# Patient Record
Sex: Male | Born: 1935 | Race: White | Hispanic: No | Marital: Married | Smoking: Former smoker
Health system: Southern US, Community
[De-identification: ages and names within clinical notes are randomized; demographics above are authoritative.]

## PROBLEM LIST (undated history)

## (undated) DIAGNOSIS — E785 Hyperlipidemia, unspecified: Secondary | ICD-10-CM

## (undated) DIAGNOSIS — N529 Male erectile dysfunction, unspecified: Secondary | ICD-10-CM

## (undated) DIAGNOSIS — N433 Hydrocele, unspecified: Secondary | ICD-10-CM

## (undated) DIAGNOSIS — N3944 Nocturnal enuresis: Secondary | ICD-10-CM

## (undated) DIAGNOSIS — N400 Enlarged prostate without lower urinary tract symptoms: Secondary | ICD-10-CM

## (undated) DIAGNOSIS — I2510 Atherosclerotic heart disease of native coronary artery without angina pectoris: Secondary | ICD-10-CM

## (undated) DIAGNOSIS — I219 Acute myocardial infarction, unspecified: Secondary | ICD-10-CM

## (undated) DIAGNOSIS — K635 Polyp of colon: Secondary | ICD-10-CM

## (undated) DIAGNOSIS — I10 Essential (primary) hypertension: Secondary | ICD-10-CM

## (undated) HISTORY — DX: Benign prostatic hyperplasia without lower urinary tract symptoms: N40.0

## (undated) HISTORY — PX: RECTAL VILLOUS ADENOMA EXCISION: SHX2313

## (undated) HISTORY — DX: Hyperlipidemia, unspecified: E78.5

## (undated) HISTORY — PX: COLONOSCOPY W/ POLYPECTOMY: SHX1380

## (undated) HISTORY — PX: CORONARY ANGIOPLASTY: SHX604

## (undated) HISTORY — DX: Polyp of colon: K63.5

## (undated) HISTORY — DX: Nocturnal enuresis: N39.44

## (undated) HISTORY — PX: VASECTOMY: SHX75

## (undated) HISTORY — PX: KNEE ARTHROSCOPY: SUR90

## (undated) HISTORY — DX: Male erectile dysfunction, unspecified: N52.9

## (undated) HISTORY — DX: Atherosclerotic heart disease of native coronary artery without angina pectoris: I25.10

## (undated) HISTORY — DX: Hydrocele, unspecified: N43.3

---

## 1990-04-24 DIAGNOSIS — I219 Acute myocardial infarction, unspecified: Secondary | ICD-10-CM

## 1990-04-24 HISTORY — DX: Acute myocardial infarction, unspecified: I21.9

## 2005-06-28 ENCOUNTER — Encounter: Admission: RE | Admit: 2005-06-28 | Discharge: 2005-06-28 | Payer: Self-pay | Admitting: Internal Medicine

## 2010-08-15 ENCOUNTER — Other Ambulatory Visit: Payer: Self-pay | Admitting: Internal Medicine

## 2010-08-15 DIAGNOSIS — R42 Dizziness and giddiness: Secondary | ICD-10-CM

## 2010-08-16 ENCOUNTER — Ambulatory Visit
Admission: RE | Admit: 2010-08-16 | Discharge: 2010-08-16 | Disposition: A | Discharge status: Home / Self Care | Payer: Medicare Other | Source: Ambulatory Visit | Attending: Internal Medicine | Admitting: Internal Medicine

## 2010-08-16 DIAGNOSIS — R42 Dizziness and giddiness: Secondary | ICD-10-CM

## 2010-08-16 MED ORDER — IOHEXOL 300 MG/ML  SOLN
75.0000 mL | Freq: Once | INTRAMUSCULAR | Status: AC | PRN
  Administered 2010-08-16: 75 mL via INTRAVENOUS

## 2012-01-12 ENCOUNTER — Encounter (HOSPITAL_COMMUNITY): Payer: Self-pay | Admitting: Emergency Medicine

## 2012-01-12 DIAGNOSIS — Z79899 Other long term (current) drug therapy: Secondary | ICD-10-CM | POA: Insufficient documentation

## 2012-01-12 DIAGNOSIS — I10 Essential (primary) hypertension: Secondary | ICD-10-CM | POA: Insufficient documentation

## 2012-01-12 DIAGNOSIS — Z7982 Long term (current) use of aspirin: Secondary | ICD-10-CM | POA: Insufficient documentation

## 2012-01-12 DIAGNOSIS — R1013 Epigastric pain: Secondary | ICD-10-CM | POA: Insufficient documentation

## 2012-01-12 DIAGNOSIS — N400 Enlarged prostate without lower urinary tract symptoms: Secondary | ICD-10-CM | POA: Insufficient documentation

## 2012-01-12 DIAGNOSIS — N433 Hydrocele, unspecified: Secondary | ICD-10-CM | POA: Insufficient documentation

## 2012-01-12 DIAGNOSIS — E669 Obesity, unspecified: Secondary | ICD-10-CM | POA: Insufficient documentation

## 2012-01-12 DIAGNOSIS — I252 Old myocardial infarction: Secondary | ICD-10-CM | POA: Insufficient documentation

## 2012-01-12 LAB — POCT I-STAT TROPONIN I: Troponin i, poc: 0 ng/mL (ref 0.00–0.08)

## 2012-01-12 NOTE — ED Notes (Signed)
Reports burning pain in mid abd--went to PCP the beginning of week--was given,  dexilant with no relief; reports has been getting worse; decrease appetite, denies n/v; reports having flatus; LBM 09/18--states it was darker than normal--unsure if black or dark brown

## 2012-01-13 ENCOUNTER — Emergency Department (HOSPITAL_COMMUNITY): Payer: Medicare Other

## 2012-01-13 ENCOUNTER — Emergency Department (HOSPITAL_COMMUNITY)
Admission: EM | Admit: 2012-01-13 | Discharge: 2012-01-13 | Disposition: A | Discharge status: Home / Self Care | Payer: Medicare Other | Attending: Emergency Medicine | Admitting: Emergency Medicine

## 2012-01-13 DIAGNOSIS — E669 Obesity, unspecified: Secondary | ICD-10-CM

## 2012-01-13 DIAGNOSIS — R1013 Epigastric pain: Secondary | ICD-10-CM

## 2012-01-13 HISTORY — DX: Essential (primary) hypertension: I10

## 2012-01-13 HISTORY — DX: Hyperlipidemia, unspecified: E78.5

## 2012-01-13 HISTORY — DX: Acute myocardial infarction, unspecified: I21.9

## 2012-01-13 LAB — URINALYSIS, ROUTINE W REFLEX MICROSCOPIC
Glucose, UA: NEGATIVE mg/dL
Specific Gravity, Urine: >1.03 — ABNORMAL HIGH (ref 1.005–1.030)
Urobilinogen, UA: 1 mg/dL (ref 0.0–1.0)
pH: 5 (ref 5.0–8.0)

## 2012-01-13 LAB — COMPREHENSIVE METABOLIC PANEL
BUN: 8 mg/dL (ref 6–23)
CO2: 26 mEq/L (ref 19–32)
Calcium: 11 mg/dL — ABNORMAL HIGH (ref 8.4–10.5)
Chloride: 99 mEq/L (ref 96–112)
GFR calc Af Amer: >90 mL/min (ref >90)
Glucose, Bld: 130 mg/dL — ABNORMAL HIGH (ref 70–99)
Potassium: 3.8 mEq/L (ref 3.5–5.1)
Total Protein: 8 g/dL (ref 6.0–8.3)

## 2012-01-13 LAB — CBC WITH DIFFERENTIAL/PLATELET
Basophils Relative: 0 % (ref 0–1)
Eosinophils Absolute: 0.1 10*3/uL (ref 0.0–0.7)
Eosinophils Relative: 2 % (ref 0–5)
HCT: 44.7 % (ref 39.0–52.0)
Hemoglobin: 16.1 g/dL (ref 13.0–17.0)
Lymphocytes Relative: 35 % (ref 12–46)
Lymphs Abs: 2.5 10*3/uL (ref 0.7–4.0)
MCHC: 36 g/dL (ref 30.0–36.0)
Neutro Abs: 3.9 10*3/uL (ref 1.7–7.7)
RBC: 5.11 MIL/uL (ref 4.22–5.81)
RDW: 12.9 % (ref 11.5–15.5)
WBC: 7.1 10*3/uL (ref 4.0–10.5)

## 2012-01-13 LAB — URINE MICROSCOPIC-ADD ON

## 2012-01-13 LAB — LIPASE, BLOOD: Lipase: 32 U/L (ref 11–59)

## 2012-01-13 MED ORDER — ONDANSETRON HCL 4 MG/2ML IJ SOLN
4.0000 mg | Freq: Once | INTRAMUSCULAR | Status: AC
  Administered 2012-01-13: 4 mg via INTRAVENOUS
  Filled 2012-01-13: qty 2

## 2012-01-13 MED ORDER — SODIUM CHLORIDE 0.9 % IV SOLN
Freq: Once | INTRAVENOUS | Status: AC
  Administered 2012-01-13: 03:00:00 via INTRAVENOUS

## 2012-01-13 MED ORDER — IOHEXOL 300 MG/ML  SOLN
20.0000 mL | INTRAMUSCULAR | Status: AC
  Administered 2012-01-13: 20 mL via ORAL

## 2012-01-13 MED ORDER — HYDROMORPHONE HCL PF 1 MG/ML IJ SOLN
1.0000 mg | Freq: Once | INTRAMUSCULAR | Status: AC
  Administered 2012-01-13: 1 mg via INTRAVENOUS
  Filled 2012-01-13: qty 1

## 2012-01-13 MED ORDER — SUCRALFATE 1 GM/10ML PO SUSP: 1.0000 g | Freq: Four times a day (QID) | ORAL | Status: DC

## 2012-01-13 MED ORDER — IOHEXOL 300 MG/ML  SOLN
100.0000 mL | Freq: Once | INTRAMUSCULAR | Status: AC | PRN
  Administered 2012-01-13: 100 mL via INTRAVENOUS

## 2012-01-13 MED ORDER — FAMOTIDINE IN NACL 20-0.9 MG/50ML-% IV SOLN
20.0000 mg | Freq: Once | INTRAVENOUS | Status: AC
  Administered 2012-01-13: 20 mg via INTRAVENOUS
  Filled 2012-01-13: qty 50

## 2012-01-13 NOTE — ED Notes (Signed)
Patient transported to CT 

## 2012-01-13 NOTE — ED Provider Notes (Signed)
History     CSN: [id number]  Arrival date & time 01/12/12  2317   First MD Initiated Contact with Patient 01/13/12 0209      Chief Complaint  Patient presents with  . Abdominal Pain    (Consider location/radiation/quality/duration/timing/severity/associated sxs/prior treatment) HPI Please note that this is a late entry. This patient was seen by me shortly after his presentation to the emergency department.  The patient presents with complaints of epigastric pain for the past 7-10 days. His pain is burning, waxes and wanes in severity, sometimes goes away entirely. He describes it as moderately severe at the time of his examination. Pain is brought on and made worse by eating. The patient was seen by his primary care physician earlier in the week and started on a proton pump inhibitor. He has been taking this medication for 5 days but has not noticed improvement in his symptoms. Pain is nonradiating. Pain 7/10 at greatest severity.  The patient denies nausea, vomiting, fever. He denies melena and hematochezia. He denies history of peptic ulcer disease, GERD and gastritis. He has not used large amounts of NSAIDs. He takes aspirin 325 mg qd. He is a nonsmoker. He has no history of GI bleeding, pancreatitis gallbladder disease.  Past Medical History  Diagnosis Date  . Hypertension   . MI (myocardial infarction) 1992  . Hyperlipidemia     Past Surgical History  Procedure Date  . Vasectomy   . Coronary angioplasty     History reviewed. No pertinent family history.  History  Substance Use Topics  . Smoking status: Former Games developer  . Smokeless tobacco: Current User    Types: Snuff   Comment: quit 1992  . Alcohol Use: No      Review of Systems  Gen: no weight loss, fevers, chills, night sweats Eyes: no discharge or drainage, no occular pain or visual changes Nose: no epistaxis or rhinorrhea Mouth: no dental pain, no sore throat Neck: no neck pain Lungs: no SOB, cough,  wheezing CV: no chest pain, palpitations, dependent edema or orthopnea Abd: As per history of present illness, otherwise negative GU: no dysuria or gross hematuria MSK: no myalgias or arthralgias Neuro: no headache, no focal neurologic deficits Skin: no rash Psyche: negative.  Allergies  Review of patient's allergies indicates no known allergies.  Home Medications   Current Outpatient Rx  Name Route Sig Dispense Refill  . ASPIRIN 325 MG PO TABS Oral Take 325 mg by mouth daily.    . DEXLANSOPRAZOLE 60 MG PO CPDR Oral Take 60 mg by mouth daily.    Marland Kitchen METOPROLOL TARTRATE 25 MG PO TABS Oral Take 25 mg by mouth 2 (two) times daily.    . ADULT MULTIVITAMIN W/MINERALS CH Oral Take 1 tablet by mouth daily.    Marland Kitchen SIMVASTATIN 20 MG PO TABS Oral Take 20 mg by mouth every evening.    . SUCRALFATE 1 GM/10ML PO SUSP Oral Take 10 mLs (1 g total) by mouth 4 (four) times daily. 420 mL 0    BP 165/82  Pulse 58  Temp 97.3 F (36.3 C) (Oral)  Resp 20  SpO2 99%  Physical Exam Gen: well developed and well nourished appearing, does not appear in distress Head: NCAT Eyes: PERL, EOMI Nose: no epistaixis or rhinorrhea Mouth/throat: mucosa is moist and pink Neck: supple, no stridor Lungs: CTA B, no wheezing, rhonchi or rales CV: RRR, no murmur, extremities well perfused Abd: obese, soft, notender, nondistended, unable to reproduce or excacerbate pain with  palpation Back: no ttp, no cva ttp Skin: no rashese, wnl Neuro: CN ii-xii grossly intact, no focal deficits Psyche; normal affect,  calm and cooperative.   ED Course  Procedures (including critical care time)  Results for orders placed during the hospital encounter of 01/13/12 (from the past 24 hour(s))  COMPREHENSIVE METABOLIC PANEL     Status: Abnormal   Collection Time   01/12/12 11:45 PM      Component Value Range   Sodium 137  135 - 145 mEq/L   Potassium 3.8  3.5 - 5.1 mEq/L   Chloride 99  96 - 112 mEq/L   CO2 26  19 - 32 mEq/L    Glucose, Bld 130 (*) 70 - 99 mg/dL   BUN 8  6 - 23 mg/dL   Creatinine, Ser 1.61  0.50 - 1.35 mg/dL   Calcium 09.6 (*) 8.4 - 10.5 mg/dL   Total Protein 8.0  6.0 - 8.3 g/dL   Albumin 4.4  3.5 - 5.2 g/dL   AST 29  0 - 37 U/L   ALT 44  0 - 53 U/L   Alkaline Phosphatase 108  39 - 117 U/L   Total Bilirubin 0.9  0.3 - 1.2 mg/dL   GFR calc non Af Amer 89 (*) >90 mL/min   GFR calc Af Amer >90  >90 mL/min  CBC WITH DIFFERENTIAL     Status: Normal   Collection Time   01/12/12 11:45 PM      Component Value Range   WBC 7.1  4.0 - 10.5 K/uL   RBC 5.11  4.22 - 5.81 MIL/uL   Hemoglobin 16.1  13.0 - 17.0 g/dL   HCT 04.5  40.9 - 81.1 %   MCV 87.5  78.0 - 100.0 fL   MCH 31.5  26.0 - 34.0 pg   MCHC 36.0  30.0 - 36.0 g/dL   RDW 91.4  78.2 - 95.6 %   Platelets 176  150 - 400 K/uL   Neutrophils Relative 55  43 - 77 %   Neutro Abs 3.9  1.7 - 7.7 K/uL   Lymphocytes Relative 35  12 - 46 %   Lymphs Abs 2.5  0.7 - 4.0 K/uL   Monocytes Relative 8  3 - 12 %   Monocytes Absolute 0.6  0.1 - 1.0 K/uL   Eosinophils Relative 2  0 - 5 %   Eosinophils Absolute 0.1  0.0 - 0.7 K/uL   Basophils Relative 0  0 - 1 %   Basophils Absolute 0.0  0.0 - 0.1 K/uL  POCT I-STAT TROPONIN I     Status: Normal   Collection Time   01/12/12 11:48 PM      Component Value Range   Troponin i, poc 0.00  0.00 - 0.08 ng/mL   Comment 3           URINALYSIS, ROUTINE W REFLEX MICROSCOPIC     Status: Abnormal   Collection Time   01/13/12  2:05 AM      Component Value Range   Color, Urine AMBER (*) YELLOW   APPearance CLOUDY (*) CLEAR   Specific Gravity, Urine >1.030 (*) 1.005 - 1.030   pH 5.0  5.0 - 8.0   Glucose, UA NEGATIVE  NEGATIVE mg/dL   Hgb urine dipstick NEGATIVE  NEGATIVE   Bilirubin Urine MODERATE (*) NEGATIVE   Ketones, ur 15 (*) NEGATIVE mg/dL   Protein, ur 30 (*) NEGATIVE mg/dL   Urobilinogen, UA 1.0  0.0 -  1.0 mg/dL   Nitrite NEGATIVE  NEGATIVE   Leukocytes, UA NEGATIVE  NEGATIVE  URINE MICROSCOPIC-ADD ON      Status: Abnormal   Collection Time   01/13/12  2:05 AM      Component Value Range   Squamous Epithelial / LPF RARE  RARE   WBC, UA 0-2  <3 WBC/hpf   RBC / HPF 0-2  <3 RBC/hpf   Bacteria, UA RARE  RARE   Crystals CA OXALATE CRYSTALS (*) NEGATIVE   Urine-Other MUCOUS PRESENT    LIPASE, BLOOD     Status: Normal   Collection Time   01/13/12  2:29 AM      Component Value Range   Lipase 32  11 - 59 U/L  OCCULT BLOOD, POC DEVICE     Status: Normal   Collection Time   01/13/12  6:52 AM      Component Value Range   Fecal Occult Bld NEGATIVE      Ct Abdomen Pelvis W Wo Contrast  01/13/2012  *RADIOLOGY REPORT*  Clinical Data: Burning pain in mid at the  CT ABDOMEN AND PELVIS WITHOUT AND WITH CONTRAST  Technique:  Multidetector CT imaging of the abdomen and pelvis was performed without contrast material in one or both body regions, followed by contrast material(s) and further sections in one or both body regions.  Contrast: OMNIPAQUE IOHEXOL 300 MG/ML  SOLN  Comparison: None.  Findings: The lung bases are clear.  No pericardial or pleural effusion.  No focal liver abnormalities.  The gallbladder appears normal.  No biliary dilatation.  Normal appearance of the pancreas.  The spleen appears normal.  Both adrenal glands appear within normal limits.  There are bilateral renal vascular calcifications.  No renal stone or evidence of hydronephrosis identified.  No hydroureter or ureterolithiasis identified.  On the delayed images there are symmetric urograms.  The urinary bladder appears normal.  The prostate gland is enlarged and has a heterogeneous appearance. The prostate gland measures 6.4 cm in transverse dimension, image 82. The patient has a left-sided hydrocele, image 94.  There is no free fluid within the abdomen or pelvis. No enlarged lymph nodes within the upper abdomen or in the pelvis. Calcified atherosclerotic disease affects the abdominal aorta and its branches.  The stomach appears within  normal limits.  The small bowel loops are unremarkable.  The appendix is not visualized.  No secondary signs of acute appendicitis.  Multiple left-sided diverticula without acute inflammation.  Review of the visualized osseous structures is significant for multilevel lumbar degenerative disc disease.  IMPRESSION:  1.  No evidence for nephrolithiasis or obstructive uropathy.  There are bilateral renal vascular type calcifications. 2.  No acute findings noted within the abdomen or pelvis. 3.  Prostate gland enlargement 4.  Left hydrocele.   Original Report Authenticated By: Rosealee Albee, M.D.    Dg Chest 2 View  01/13/2012  *RADIOLOGY REPORT*  Clinical Data: abdominal pain  CHEST - 2 VIEW  Comparison: None  Findings: The heart size and mediastinal contours are within normal limits.  Both lungs are clear.  The visualized skeletal structures are unremarkable.  IMPRESSION: Negative exam.   Original Report Authenticated By: Rosealee Albee, M.D.    EKG: NSR, no acute ischemic changes, normal QRS complex, no change from previous EKG  1. Epigastric pain   2. Obesity   3. Hypercalcemia    DDX: gastritis, PUD, GERD, pancreatitis, gallbladder disease, SBO, colitis, UTI, enteritis.   CXR: no  acute findings.   MDM   ED work up reassuring with normal WBC, HGB, lipase, LFTs,. Notable for mild hypercalcemia to 11.0, glucose mildly elevated at 130. Troponin negative. Stool is heme negative. CT scan with and without contrast is negative for etiology of epigastric pain. I strongly suspect that the patient's sx are secondary to gastritis vs PUD vs. GERD.  I have counseled the patient to continue PPI and to f/u with PCP. I have counseled the patient that he should discuss referral to GI with plan for EGD if sx continue. Patient is counseled to return to the ED for red flag sx. Pain pain free on discharge from the ED. We are adding carafate for symptomatic management.         Brandt Loosen, MD 01/13/12 [phone]

## 2014-01-23 ENCOUNTER — Ambulatory Visit (INDEPENDENT_AMBULATORY_CARE_PROVIDER_SITE_OTHER): Payer: Medicare Other | Admitting: Cardiology

## 2014-01-23 ENCOUNTER — Encounter: Payer: Medicare Other | Admitting: Cardiology

## 2014-01-23 ENCOUNTER — Encounter: Payer: Self-pay | Admitting: Cardiology

## 2014-01-23 VITALS — BP 148/82 | HR 59 | Ht 72.0 in | Wt 233.0 lb

## 2014-01-23 DIAGNOSIS — I10 Essential (primary) hypertension: Secondary | ICD-10-CM | POA: Insufficient documentation

## 2014-01-23 DIAGNOSIS — I259 Chronic ischemic heart disease, unspecified: Secondary | ICD-10-CM

## 2014-01-23 DIAGNOSIS — IMO0001 Reserved for inherently not codable concepts without codable children: Secondary | ICD-10-CM

## 2014-01-23 DIAGNOSIS — R209 Unspecified disturbances of skin sensation: Secondary | ICD-10-CM

## 2014-01-23 DIAGNOSIS — M170 Bilateral primary osteoarthritis of knee: Secondary | ICD-10-CM

## 2014-01-23 DIAGNOSIS — E7800 Pure hypercholesterolemia, unspecified: Secondary | ICD-10-CM

## 2014-01-23 DIAGNOSIS — R0609 Other forms of dyspnea: Secondary | ICD-10-CM

## 2014-01-23 DIAGNOSIS — R0600 Dyspnea, unspecified: Secondary | ICD-10-CM | POA: Insufficient documentation

## 2014-01-23 NOTE — Patient Instructions (Signed)
Your physician recommends that you continue on your current medications as directed. Please refer to the Current Medication list given to you today.  Your physician has requested that you have a lexiscan myoview. For further information please visit [email or web address]. Please follow instruction sheet, as given.  Your physician has requested that you have a carotid duplex. This test is an ultrasound of the carotid arteries in your neck. It looks at blood flow through these arteries that supply the brain with blood. Allow one hour for this exam. There are no restrictions or special instructions.  Your physician wants you to follow-up in:  1 year with an EKG You will receive a reminder letter in the mail two months in advance. If you don't receive a letter, please call our office to schedule the follow-up appointment.

## 2014-01-23 NOTE — Progress Notes (Signed)
Timothy Arias Date of Birth:  May 13, 1935 [employer] [address] [name],   [address] [phone]        Fax   [phone]   History of Present Illness: This pleasant 78 year old gentleman is seen to reestablish cardiology care after a long absence.  We saw him in the old office about 10 years ago he thinks.  He is being seen at the request of Dr. Lavone Orn.  The patient has a history of known ischemic heart disease.  In 1992 he had a myocardial infarction and underwent PCI.  The patient does not know whether he had a stent or just angioplasty alone.  We don't have those old records available.  The patient has done well since then with no recurrent chest pain.  He has been having some recent exertional dyspnea.  He has also been having some discomfort in the right neck.  He describes it as a feeling like something crawling under his skin.  It has been present 6-8 weeks.  It is worse when he lies down in the evening to go to.  The patient does not get any regular aerobic exercise intentionally.  He has a part-time job as a Mudlogger runner" for a Education officer, environmental. His family history reveals that his mother died of coronary disease.  His father died in his 83s of alcoholism. The patient does not smoke.  He does dip snuff.  He has a past history of hypercholesterolemia and is on simvastatin.  He has had a history of high blood pressure and is on metoprolol. He has not been having any chest pressure.  He has had discomfort in his right neck which is described in terms suggesting paresthesias.  There may be some right arm radiation.  He has had exertional dyspnea.  His weight is down 30 pounds intentionally over the past year.  Current Outpatient Prescriptions  Medication Sig Dispense Refill  . aspirin 325 MG tablet Take 325 mg by mouth daily.      . metoprolol tartrate (LOPRESSOR) 25 MG tablet Take 25 mg by mouth 2 (two) times daily.      . Multiple Vitamin  (MULTIVITAMIN WITH MINERALS) TABS Take 1 tablet by mouth daily.      . simvastatin (ZOCOR) 20 MG tablet Take 20 mg by mouth every evening.       No current facility-administered medications for this visit.    No Known Allergies  Patient Active Problem List   Diagnosis Date Noted  . Ischemic heart disease 01/23/2014  . Hypercholesterolemia 01/23/2014  . Essential hypertension 01/23/2014  . Paresthesias/numbness 01/23/2014  . Osteoarthritis of both knees 01/23/2014  . Dyspnea on exertion 01/23/2014    History  Smoking status  . Former Smoker -- 1.00 packs/day for 60 years  . Quit date: 08/17/1990  Smokeless tobacco  . Current User  . Types: Snuff    Comment: quit 1992    History  Alcohol Use  . Yes    Comment: occasionally    No family history on file.  Review of Systems: Constitutional: no fever chills diaphoresis or fatigue or change in weight.  Head and neck: no hearing loss, no epistaxis, no photophobia or visual disturbance. Respiratory: No cough, shortness of breath or wheezing. Cardiovascular: No chest pain peripheral edema, palpitations. Gastrointestinal: No abdominal distention, no abdominal pain, no change in bowel habits hematochezia or melena. Genitourinary: No dysuria, no frequency, no urgency, no nocturia. Musculoskeletal:No arthralgias, no back  pain, no gait disturbance or myalgias. Neurological: No dizziness, no headaches, no numbness, no seizures, no syncope, no weakness, no tremors. Hematologic: No lymphadenopathy, no easy bruising. Psychiatric: No confusion, no hallucinations, no sleep disturbance.    Physical Exam: Filed Vitals:   01/23/14 0937  BP: 148/82  Pulse: 59  The patient appears to be in no distress.  Head and neck exam reveals that the pupils are equal and reactive.  The extraocular movements are full.  There is no scleral icterus.  Mouth and pharynx are benign.  No lymphadenopathy.  No carotid bruits.  The jugular venous pressure  is normal.  Thyroid is not enlarged or tender.  Chest is clear to percussion and auscultation.  No rales or rhonchi.  Expansion of the chest is symmetrical.  Heart reveals no abnormal lift or heave.  First and second heart sounds are normal.  There is no murmur gallop rub or click.  The abdomen is soft and nontender.  Bowel sounds are normoactive.  There is no hepatosplenomegaly or mass.  There are no abdominal bruits.  Extremities reveal no phlebitis or edema.  Pedal pulses are good.  There is no cyanosis or clubbing.  Neurologic exam is normal strength and no lateralizing weakness.  No sensory deficits.  Integument reveals no rash  EKG shows sinus bradycardia and is otherwise within normal limits.  No ischemic changes at rest.  Assessment / Plan: 1. ischemic heart disease status post acute myocardial infarction in 1992 treated with PCI.  The patient is not sure whether he had angioplasty alone or whether he also had a stent. 2. essential hypertension 3. Hypercholesterolemia 4. discomfort and paresthesias of right neck.  No audible bruit noted. 5. osteoarthritis of knees  Disposition: We will have the patient return for a Lexi scan Myoview stress test to evaluate his shortness of breath.  He did not think he would be able to walk on a treadmill because of his osteoarthritis of his knees. We will obtain an carotid duplex to evaluate his right neck symptoms paresthesias and discomfort. Recheck in one year for office visit and EKG. Many thanks for the opportunity to see this gentleman with you.  I will be in touch with you regarding the results of his carotid duplex and his Myoview stress test

## 2014-01-27 ENCOUNTER — Ambulatory Visit (HOSPITAL_COMMUNITY): Payer: Medicare Other | Attending: Cardiology | Admitting: Radiology

## 2014-01-27 DIAGNOSIS — Z87891 Personal history of nicotine dependence: Secondary | ICD-10-CM | POA: Insufficient documentation

## 2014-01-27 DIAGNOSIS — R0609 Other forms of dyspnea: Secondary | ICD-10-CM

## 2014-01-27 DIAGNOSIS — IMO0001 Reserved for inherently not codable concepts without codable children: Secondary | ICD-10-CM

## 2014-01-27 DIAGNOSIS — I10 Essential (primary) hypertension: Secondary | ICD-10-CM | POA: Diagnosis not present

## 2014-01-27 DIAGNOSIS — R0600 Dyspnea, unspecified: Secondary | ICD-10-CM

## 2014-01-27 DIAGNOSIS — R209 Unspecified disturbances of skin sensation: Secondary | ICD-10-CM

## 2014-01-27 DIAGNOSIS — I6523 Occlusion and stenosis of bilateral carotid arteries: Secondary | ICD-10-CM

## 2014-01-27 DIAGNOSIS — E785 Hyperlipidemia, unspecified: Secondary | ICD-10-CM | POA: Diagnosis not present

## 2014-01-27 DIAGNOSIS — I259 Chronic ischemic heart disease, unspecified: Secondary | ICD-10-CM

## 2014-01-27 DIAGNOSIS — E7800 Pure hypercholesterolemia, unspecified: Secondary | ICD-10-CM

## 2014-01-27 DIAGNOSIS — M170 Bilateral primary osteoarthritis of knee: Secondary | ICD-10-CM

## 2014-01-27 NOTE — Progress Notes (Signed)
Carotid Duplex performed. 

## 2014-01-28 ENCOUNTER — Telehealth: Payer: Self-pay | Admitting: *Deleted

## 2014-01-28 NOTE — Telephone Encounter (Signed)
[facility] Pre Cert/Auth; Verdon Cummins, the pt refused to scheduled the nuclear until after he gets the results for the carotid and then he will let us know if he wants the nuclear or not. Please ask him about the nuclear when you give him the results of the carotid so we will know what to do with the order in our workqueue. thanks   Carotid scheduled for 01/27/2014

## 2014-01-28 NOTE — Telephone Encounter (Signed)
Left message to call back  

## 2014-01-28 NOTE — Telephone Encounter (Signed)
Message copied by Earvin Hansen on Wed Jan 28, 2014  4:48 PM ------      Message from: Darlin Coco      Created: Wed Jan 28, 2014  4:16 PM       Please report.  There is plaque in both carotids but not enough to cause obstruction. Continue ASA. Continue heart healthy diet.  Send copy to Dr. Lavone Orn. ------

## 2014-01-30 ENCOUNTER — Encounter: Payer: Self-pay | Admitting: Cardiology

## 2014-02-02 NOTE — Telephone Encounter (Signed)
Advised patient of carotid results. Patient does decline having myoview at this time.

## 2014-02-02 NOTE — Telephone Encounter (Signed)
Follow Up ° °Pt called to follow up//sr  °

## 2015-04-28 DIAGNOSIS — I10 Essential (primary) hypertension: Secondary | ICD-10-CM | POA: Diagnosis not present

## 2015-04-28 DIAGNOSIS — M179 Osteoarthritis of knee, unspecified: Secondary | ICD-10-CM | POA: Diagnosis not present

## 2015-04-28 DIAGNOSIS — Z23 Encounter for immunization: Secondary | ICD-10-CM | POA: Diagnosis not present

## 2015-06-08 DIAGNOSIS — I10 Essential (primary) hypertension: Secondary | ICD-10-CM | POA: Diagnosis not present

## 2015-11-01 DIAGNOSIS — Z0000 Encounter for general adult medical examination without abnormal findings: Secondary | ICD-10-CM | POA: Diagnosis not present

## 2015-11-01 DIAGNOSIS — R7301 Impaired fasting glucose: Secondary | ICD-10-CM | POA: Diagnosis not present

## 2015-11-01 DIAGNOSIS — Z6830 Body mass index (BMI) 30.0-30.9, adult: Secondary | ICD-10-CM | POA: Diagnosis not present

## 2015-11-01 DIAGNOSIS — Z1389 Encounter for screening for other disorder: Secondary | ICD-10-CM | POA: Diagnosis not present

## 2015-11-01 DIAGNOSIS — I10 Essential (primary) hypertension: Secondary | ICD-10-CM | POA: Diagnosis not present

## 2015-11-01 DIAGNOSIS — E782 Mixed hyperlipidemia: Secondary | ICD-10-CM | POA: Diagnosis not present

## 2016-03-30 DIAGNOSIS — L821 Other seborrheic keratosis: Secondary | ICD-10-CM | POA: Diagnosis not present

## 2016-03-30 DIAGNOSIS — D225 Melanocytic nevi of trunk: Secondary | ICD-10-CM | POA: Diagnosis not present

## 2016-03-30 DIAGNOSIS — L218 Other seborrheic dermatitis: Secondary | ICD-10-CM | POA: Diagnosis not present

## 2016-03-30 DIAGNOSIS — Z85828 Personal history of other malignant neoplasm of skin: Secondary | ICD-10-CM | POA: Diagnosis not present

## 2016-03-30 DIAGNOSIS — D1801 Hemangioma of skin and subcutaneous tissue: Secondary | ICD-10-CM | POA: Diagnosis not present

## 2016-05-09 DIAGNOSIS — I10 Essential (primary) hypertension: Secondary | ICD-10-CM | POA: Diagnosis not present

## 2016-05-09 DIAGNOSIS — Z23 Encounter for immunization: Secondary | ICD-10-CM | POA: Diagnosis not present

## 2016-05-09 DIAGNOSIS — G4700 Insomnia, unspecified: Secondary | ICD-10-CM | POA: Diagnosis not present

## 2016-11-06 DIAGNOSIS — Z0000 Encounter for general adult medical examination without abnormal findings: Secondary | ICD-10-CM | POA: Diagnosis not present

## 2016-11-06 DIAGNOSIS — I2510 Atherosclerotic heart disease of native coronary artery without angina pectoris: Secondary | ICD-10-CM | POA: Diagnosis not present

## 2016-11-06 DIAGNOSIS — E782 Mixed hyperlipidemia: Secondary | ICD-10-CM | POA: Diagnosis not present

## 2016-11-06 DIAGNOSIS — I10 Essential (primary) hypertension: Secondary | ICD-10-CM | POA: Diagnosis not present

## 2016-11-06 DIAGNOSIS — Z1389 Encounter for screening for other disorder: Secondary | ICD-10-CM | POA: Diagnosis not present

## 2016-11-06 DIAGNOSIS — D369 Benign neoplasm, unspecified site: Secondary | ICD-10-CM | POA: Diagnosis not present

## 2016-11-06 DIAGNOSIS — R7301 Impaired fasting glucose: Secondary | ICD-10-CM | POA: Diagnosis not present

## 2017-05-10 DIAGNOSIS — I10 Essential (primary) hypertension: Secondary | ICD-10-CM | POA: Diagnosis not present

## 2017-05-10 DIAGNOSIS — R1013 Epigastric pain: Secondary | ICD-10-CM | POA: Diagnosis not present

## 2017-05-29 DIAGNOSIS — H25811 Combined forms of age-related cataract, right eye: Secondary | ICD-10-CM | POA: Diagnosis not present

## 2017-05-29 DIAGNOSIS — H43391 Other vitreous opacities, right eye: Secondary | ICD-10-CM | POA: Diagnosis not present

## 2017-05-29 DIAGNOSIS — D3132 Benign neoplasm of left choroid: Secondary | ICD-10-CM | POA: Diagnosis not present

## 2017-05-29 DIAGNOSIS — D3131 Benign neoplasm of right choroid: Secondary | ICD-10-CM | POA: Diagnosis not present

## 2017-05-29 DIAGNOSIS — H2512 Age-related nuclear cataract, left eye: Secondary | ICD-10-CM | POA: Diagnosis not present

## 2017-07-23 DIAGNOSIS — Z85828 Personal history of other malignant neoplasm of skin: Secondary | ICD-10-CM | POA: Diagnosis not present

## 2017-07-23 DIAGNOSIS — L820 Inflamed seborrheic keratosis: Secondary | ICD-10-CM | POA: Diagnosis not present

## 2017-09-12 DIAGNOSIS — Z23 Encounter for immunization: Secondary | ICD-10-CM | POA: Diagnosis not present

## 2017-09-12 DIAGNOSIS — S61219A Laceration without foreign body of unspecified finger without damage to nail, initial encounter: Secondary | ICD-10-CM | POA: Diagnosis not present

## 2017-09-12 DIAGNOSIS — S6010XA Contusion of unspecified finger with damage to nail, initial encounter: Secondary | ICD-10-CM | POA: Diagnosis not present

## 2017-11-13 DIAGNOSIS — Z1389 Encounter for screening for other disorder: Secondary | ICD-10-CM | POA: Diagnosis not present

## 2017-11-13 DIAGNOSIS — E039 Hypothyroidism, unspecified: Secondary | ICD-10-CM | POA: Diagnosis not present

## 2017-11-13 DIAGNOSIS — R413 Other amnesia: Secondary | ICD-10-CM | POA: Diagnosis not present

## 2017-11-13 DIAGNOSIS — Z0000 Encounter for general adult medical examination without abnormal findings: Secondary | ICD-10-CM | POA: Diagnosis not present

## 2017-11-13 DIAGNOSIS — I10 Essential (primary) hypertension: Secondary | ICD-10-CM | POA: Diagnosis not present

## 2017-11-13 DIAGNOSIS — E782 Mixed hyperlipidemia: Secondary | ICD-10-CM | POA: Diagnosis not present

## 2017-11-13 DIAGNOSIS — R29898 Other symptoms and signs involving the musculoskeletal system: Secondary | ICD-10-CM | POA: Diagnosis not present

## 2017-11-15 DIAGNOSIS — D3132 Benign neoplasm of left choroid: Secondary | ICD-10-CM | POA: Diagnosis not present

## 2017-11-15 DIAGNOSIS — H25811 Combined forms of age-related cataract, right eye: Secondary | ICD-10-CM | POA: Diagnosis not present

## 2018-01-08 DIAGNOSIS — Z23 Encounter for immunization: Secondary | ICD-10-CM | POA: Diagnosis not present

## 2018-01-14 DIAGNOSIS — N400 Enlarged prostate without lower urinary tract symptoms: Secondary | ICD-10-CM | POA: Diagnosis not present

## 2018-01-14 DIAGNOSIS — I2510 Atherosclerotic heart disease of native coronary artery without angina pectoris: Secondary | ICD-10-CM | POA: Diagnosis not present

## 2018-01-14 DIAGNOSIS — I252 Old myocardial infarction: Secondary | ICD-10-CM | POA: Diagnosis not present

## 2018-01-14 DIAGNOSIS — E039 Hypothyroidism, unspecified: Secondary | ICD-10-CM | POA: Diagnosis not present

## 2018-01-14 DIAGNOSIS — M179 Osteoarthritis of knee, unspecified: Secondary | ICD-10-CM | POA: Diagnosis not present

## 2018-01-14 DIAGNOSIS — I10 Essential (primary) hypertension: Secondary | ICD-10-CM | POA: Diagnosis not present

## 2018-01-14 DIAGNOSIS — E782 Mixed hyperlipidemia: Secondary | ICD-10-CM | POA: Diagnosis not present

## 2018-01-17 ENCOUNTER — Other Ambulatory Visit: Payer: Self-pay | Admitting: Internal Medicine

## 2018-01-17 DIAGNOSIS — R531 Weakness: Secondary | ICD-10-CM

## 2018-01-17 DIAGNOSIS — R29898 Other symptoms and signs involving the musculoskeletal system: Secondary | ICD-10-CM

## 2018-01-17 DIAGNOSIS — R413 Other amnesia: Secondary | ICD-10-CM | POA: Diagnosis not present

## 2018-01-23 ENCOUNTER — Ambulatory Visit
Admission: RE | Admit: 2018-01-23 | Discharge: 2018-01-23 | Disposition: A | Discharge status: Home / Self Care | Payer: Self-pay | Source: Ambulatory Visit | Attending: Internal Medicine | Admitting: Internal Medicine

## 2018-01-23 DIAGNOSIS — R531 Weakness: Secondary | ICD-10-CM

## 2018-01-23 DIAGNOSIS — R29898 Other symptoms and signs involving the musculoskeletal system: Secondary | ICD-10-CM

## 2018-01-23 DIAGNOSIS — I739 Peripheral vascular disease, unspecified: Secondary | ICD-10-CM | POA: Diagnosis not present

## 2018-02-19 DIAGNOSIS — I2510 Atherosclerotic heart disease of native coronary artery without angina pectoris: Secondary | ICD-10-CM | POA: Diagnosis not present

## 2018-02-19 DIAGNOSIS — E039 Hypothyroidism, unspecified: Secondary | ICD-10-CM | POA: Diagnosis not present

## 2018-02-19 DIAGNOSIS — I10 Essential (primary) hypertension: Secondary | ICD-10-CM | POA: Diagnosis not present

## 2018-02-19 DIAGNOSIS — N400 Enlarged prostate without lower urinary tract symptoms: Secondary | ICD-10-CM | POA: Diagnosis not present

## 2018-02-19 DIAGNOSIS — E782 Mixed hyperlipidemia: Secondary | ICD-10-CM | POA: Diagnosis not present

## 2018-02-19 DIAGNOSIS — I252 Old myocardial infarction: Secondary | ICD-10-CM | POA: Diagnosis not present

## 2018-02-19 DIAGNOSIS — M179 Osteoarthritis of knee, unspecified: Secondary | ICD-10-CM | POA: Diagnosis not present

## 2018-05-03 DIAGNOSIS — I10 Essential (primary) hypertension: Secondary | ICD-10-CM | POA: Diagnosis not present

## 2018-05-03 DIAGNOSIS — F0390 Unspecified dementia without behavioral disturbance: Secondary | ICD-10-CM | POA: Diagnosis not present

## 2018-05-03 DIAGNOSIS — M179 Osteoarthritis of knee, unspecified: Secondary | ICD-10-CM | POA: Diagnosis not present

## 2018-05-23 DIAGNOSIS — M179 Osteoarthritis of knee, unspecified: Secondary | ICD-10-CM | POA: Diagnosis not present

## 2018-05-23 DIAGNOSIS — F068 Other specified mental disorders due to known physiological condition: Secondary | ICD-10-CM | POA: Diagnosis not present

## 2018-05-23 DIAGNOSIS — F0390 Unspecified dementia without behavioral disturbance: Secondary | ICD-10-CM | POA: Diagnosis not present

## 2018-05-23 DIAGNOSIS — E782 Mixed hyperlipidemia: Secondary | ICD-10-CM | POA: Diagnosis not present

## 2018-05-23 DIAGNOSIS — N400 Enlarged prostate without lower urinary tract symptoms: Secondary | ICD-10-CM | POA: Diagnosis not present

## 2018-05-23 DIAGNOSIS — E039 Hypothyroidism, unspecified: Secondary | ICD-10-CM | POA: Diagnosis not present

## 2018-05-23 DIAGNOSIS — I2510 Atherosclerotic heart disease of native coronary artery without angina pectoris: Secondary | ICD-10-CM | POA: Diagnosis not present

## 2018-05-23 DIAGNOSIS — I10 Essential (primary) hypertension: Secondary | ICD-10-CM | POA: Diagnosis not present

## 2018-05-23 DIAGNOSIS — I252 Old myocardial infarction: Secondary | ICD-10-CM | POA: Diagnosis not present

## 2018-07-22 DIAGNOSIS — I10 Essential (primary) hypertension: Secondary | ICD-10-CM | POA: Diagnosis not present

## 2018-07-22 DIAGNOSIS — E039 Hypothyroidism, unspecified: Secondary | ICD-10-CM | POA: Diagnosis not present

## 2018-07-22 DIAGNOSIS — F068 Other specified mental disorders due to known physiological condition: Secondary | ICD-10-CM | POA: Diagnosis not present

## 2018-07-22 DIAGNOSIS — I2510 Atherosclerotic heart disease of native coronary artery without angina pectoris: Secondary | ICD-10-CM | POA: Diagnosis not present

## 2018-07-22 DIAGNOSIS — F0390 Unspecified dementia without behavioral disturbance: Secondary | ICD-10-CM | POA: Diagnosis not present

## 2018-07-22 DIAGNOSIS — M179 Osteoarthritis of knee, unspecified: Secondary | ICD-10-CM | POA: Diagnosis not present

## 2018-07-22 DIAGNOSIS — E782 Mixed hyperlipidemia: Secondary | ICD-10-CM | POA: Diagnosis not present

## 2018-07-22 DIAGNOSIS — I252 Old myocardial infarction: Secondary | ICD-10-CM | POA: Diagnosis not present

## 2018-07-22 DIAGNOSIS — N400 Enlarged prostate without lower urinary tract symptoms: Secondary | ICD-10-CM | POA: Diagnosis not present

## 2018-08-22 DIAGNOSIS — M179 Osteoarthritis of knee, unspecified: Secondary | ICD-10-CM | POA: Diagnosis not present

## 2018-08-22 DIAGNOSIS — E782 Mixed hyperlipidemia: Secondary | ICD-10-CM | POA: Diagnosis not present

## 2018-08-22 DIAGNOSIS — F068 Other specified mental disorders due to known physiological condition: Secondary | ICD-10-CM | POA: Diagnosis not present

## 2018-08-22 DIAGNOSIS — I252 Old myocardial infarction: Secondary | ICD-10-CM | POA: Diagnosis not present

## 2018-08-22 DIAGNOSIS — E039 Hypothyroidism, unspecified: Secondary | ICD-10-CM | POA: Diagnosis not present

## 2018-08-22 DIAGNOSIS — N400 Enlarged prostate without lower urinary tract symptoms: Secondary | ICD-10-CM | POA: Diagnosis not present

## 2018-08-22 DIAGNOSIS — I2510 Atherosclerotic heart disease of native coronary artery without angina pectoris: Secondary | ICD-10-CM | POA: Diagnosis not present

## 2018-08-22 DIAGNOSIS — F0390 Unspecified dementia without behavioral disturbance: Secondary | ICD-10-CM | POA: Diagnosis not present

## 2018-08-22 DIAGNOSIS — I10 Essential (primary) hypertension: Secondary | ICD-10-CM | POA: Diagnosis not present

## 2018-08-29 DIAGNOSIS — M1712 Unilateral primary osteoarthritis, left knee: Secondary | ICD-10-CM | POA: Diagnosis not present

## 2018-08-29 DIAGNOSIS — M179 Osteoarthritis of knee, unspecified: Secondary | ICD-10-CM | POA: Insufficient documentation

## 2018-08-29 DIAGNOSIS — M1711 Unilateral primary osteoarthritis, right knee: Secondary | ICD-10-CM | POA: Diagnosis not present

## 2018-08-29 DIAGNOSIS — M7062 Trochanteric bursitis, left hip: Secondary | ICD-10-CM | POA: Insufficient documentation

## 2018-08-29 DIAGNOSIS — M25562 Pain in left knee: Secondary | ICD-10-CM | POA: Diagnosis not present

## 2018-08-29 DIAGNOSIS — M25552 Pain in left hip: Secondary | ICD-10-CM | POA: Diagnosis not present

## 2018-08-29 DIAGNOSIS — M170 Bilateral primary osteoarthritis of knee: Secondary | ICD-10-CM | POA: Diagnosis not present

## 2018-09-18 DIAGNOSIS — I10 Essential (primary) hypertension: Secondary | ICD-10-CM | POA: Diagnosis not present

## 2018-09-18 DIAGNOSIS — F0390 Unspecified dementia without behavioral disturbance: Secondary | ICD-10-CM | POA: Diagnosis not present

## 2018-09-18 DIAGNOSIS — E782 Mixed hyperlipidemia: Secondary | ICD-10-CM | POA: Diagnosis not present

## 2018-09-18 DIAGNOSIS — M179 Osteoarthritis of knee, unspecified: Secondary | ICD-10-CM | POA: Diagnosis not present

## 2018-09-18 DIAGNOSIS — N400 Enlarged prostate without lower urinary tract symptoms: Secondary | ICD-10-CM | POA: Diagnosis not present

## 2018-09-18 DIAGNOSIS — E039 Hypothyroidism, unspecified: Secondary | ICD-10-CM | POA: Diagnosis not present

## 2018-09-18 DIAGNOSIS — F068 Other specified mental disorders due to known physiological condition: Secondary | ICD-10-CM | POA: Diagnosis not present

## 2018-09-18 DIAGNOSIS — I2510 Atherosclerotic heart disease of native coronary artery without angina pectoris: Secondary | ICD-10-CM | POA: Diagnosis not present

## 2018-09-18 DIAGNOSIS — I252 Old myocardial infarction: Secondary | ICD-10-CM | POA: Diagnosis not present

## 2018-10-16 DIAGNOSIS — F068 Other specified mental disorders due to known physiological condition: Secondary | ICD-10-CM | POA: Diagnosis not present

## 2018-10-16 DIAGNOSIS — F0390 Unspecified dementia without behavioral disturbance: Secondary | ICD-10-CM | POA: Diagnosis not present

## 2018-10-16 DIAGNOSIS — N400 Enlarged prostate without lower urinary tract symptoms: Secondary | ICD-10-CM | POA: Diagnosis not present

## 2018-10-16 DIAGNOSIS — I252 Old myocardial infarction: Secondary | ICD-10-CM | POA: Diagnosis not present

## 2018-10-16 DIAGNOSIS — M179 Osteoarthritis of knee, unspecified: Secondary | ICD-10-CM | POA: Diagnosis not present

## 2018-10-16 DIAGNOSIS — E782 Mixed hyperlipidemia: Secondary | ICD-10-CM | POA: Diagnosis not present

## 2018-10-16 DIAGNOSIS — I10 Essential (primary) hypertension: Secondary | ICD-10-CM | POA: Diagnosis not present

## 2018-10-16 DIAGNOSIS — I2510 Atherosclerotic heart disease of native coronary artery without angina pectoris: Secondary | ICD-10-CM | POA: Diagnosis not present

## 2018-10-16 DIAGNOSIS — E039 Hypothyroidism, unspecified: Secondary | ICD-10-CM | POA: Diagnosis not present

## 2018-10-22 DIAGNOSIS — Z720 Tobacco use: Secondary | ICD-10-CM | POA: Diagnosis not present

## 2018-10-30 DIAGNOSIS — M170 Bilateral primary osteoarthritis of knee: Secondary | ICD-10-CM | POA: Diagnosis not present

## 2018-10-30 DIAGNOSIS — M25562 Pain in left knee: Secondary | ICD-10-CM | POA: Diagnosis not present

## 2018-10-30 DIAGNOSIS — M25762 Osteophyte, left knee: Secondary | ICD-10-CM | POA: Diagnosis not present

## 2018-10-31 DIAGNOSIS — M25511 Pain in right shoulder: Secondary | ICD-10-CM | POA: Diagnosis not present

## 2018-10-31 DIAGNOSIS — R2689 Other abnormalities of gait and mobility: Secondary | ICD-10-CM | POA: Diagnosis not present

## 2018-10-31 DIAGNOSIS — M25761 Osteophyte, right knee: Secondary | ICD-10-CM | POA: Diagnosis not present

## 2018-10-31 DIAGNOSIS — M222X1 Patellofemoral disorders, right knee: Secondary | ICD-10-CM | POA: Diagnosis not present

## 2018-10-31 DIAGNOSIS — M1711 Unilateral primary osteoarthritis, right knee: Secondary | ICD-10-CM | POA: Diagnosis not present

## 2018-11-13 ENCOUNTER — Other Ambulatory Visit: Payer: Self-pay

## 2018-11-13 DIAGNOSIS — Z20822 Contact with and (suspected) exposure to covid-19: Secondary | ICD-10-CM

## 2018-11-16 LAB — NOVEL CORONAVIRUS, NAA: SARS-CoV-2, NAA: NOT DETECTED

## 2018-11-20 DIAGNOSIS — F0390 Unspecified dementia without behavioral disturbance: Secondary | ICD-10-CM | POA: Diagnosis not present

## 2018-11-20 DIAGNOSIS — F068 Other specified mental disorders due to known physiological condition: Secondary | ICD-10-CM | POA: Diagnosis not present

## 2018-11-20 DIAGNOSIS — I252 Old myocardial infarction: Secondary | ICD-10-CM | POA: Diagnosis not present

## 2018-11-20 DIAGNOSIS — I2510 Atherosclerotic heart disease of native coronary artery without angina pectoris: Secondary | ICD-10-CM | POA: Diagnosis not present

## 2018-11-20 DIAGNOSIS — N400 Enlarged prostate without lower urinary tract symptoms: Secondary | ICD-10-CM | POA: Diagnosis not present

## 2018-11-20 DIAGNOSIS — M179 Osteoarthritis of knee, unspecified: Secondary | ICD-10-CM | POA: Diagnosis not present

## 2018-11-20 DIAGNOSIS — E039 Hypothyroidism, unspecified: Secondary | ICD-10-CM | POA: Diagnosis not present

## 2018-11-20 DIAGNOSIS — I10 Essential (primary) hypertension: Secondary | ICD-10-CM | POA: Diagnosis not present

## 2018-11-20 DIAGNOSIS — E782 Mixed hyperlipidemia: Secondary | ICD-10-CM | POA: Diagnosis not present

## 2018-12-04 DIAGNOSIS — F0390 Unspecified dementia without behavioral disturbance: Secondary | ICD-10-CM | POA: Diagnosis not present

## 2018-12-04 DIAGNOSIS — Z1389 Encounter for screening for other disorder: Secondary | ICD-10-CM | POA: Diagnosis not present

## 2018-12-04 DIAGNOSIS — Z0000 Encounter for general adult medical examination without abnormal findings: Secondary | ICD-10-CM | POA: Diagnosis not present

## 2018-12-04 DIAGNOSIS — I2510 Atherosclerotic heart disease of native coronary artery without angina pectoris: Secondary | ICD-10-CM | POA: Diagnosis not present

## 2018-12-04 DIAGNOSIS — R7301 Impaired fasting glucose: Secondary | ICD-10-CM | POA: Diagnosis not present

## 2018-12-04 DIAGNOSIS — M179 Osteoarthritis of knee, unspecified: Secondary | ICD-10-CM | POA: Diagnosis not present

## 2018-12-04 DIAGNOSIS — I10 Essential (primary) hypertension: Secondary | ICD-10-CM | POA: Diagnosis not present

## 2018-12-04 DIAGNOSIS — E782 Mixed hyperlipidemia: Secondary | ICD-10-CM | POA: Diagnosis not present

## 2018-12-04 DIAGNOSIS — Z5181 Encounter for therapeutic drug level monitoring: Secondary | ICD-10-CM | POA: Diagnosis not present

## 2018-12-12 DIAGNOSIS — I2510 Atherosclerotic heart disease of native coronary artery without angina pectoris: Secondary | ICD-10-CM | POA: Diagnosis not present

## 2018-12-12 DIAGNOSIS — N400 Enlarged prostate without lower urinary tract symptoms: Secondary | ICD-10-CM | POA: Diagnosis not present

## 2018-12-12 DIAGNOSIS — M179 Osteoarthritis of knee, unspecified: Secondary | ICD-10-CM | POA: Diagnosis not present

## 2018-12-12 DIAGNOSIS — F068 Other specified mental disorders due to known physiological condition: Secondary | ICD-10-CM | POA: Diagnosis not present

## 2018-12-12 DIAGNOSIS — I252 Old myocardial infarction: Secondary | ICD-10-CM | POA: Diagnosis not present

## 2018-12-12 DIAGNOSIS — I10 Essential (primary) hypertension: Secondary | ICD-10-CM | POA: Diagnosis not present

## 2018-12-12 DIAGNOSIS — E039 Hypothyroidism, unspecified: Secondary | ICD-10-CM | POA: Diagnosis not present

## 2018-12-12 DIAGNOSIS — E782 Mixed hyperlipidemia: Secondary | ICD-10-CM | POA: Diagnosis not present

## 2018-12-12 DIAGNOSIS — F0390 Unspecified dementia without behavioral disturbance: Secondary | ICD-10-CM | POA: Diagnosis not present

## 2019-01-14 DIAGNOSIS — N400 Enlarged prostate without lower urinary tract symptoms: Secondary | ICD-10-CM | POA: Diagnosis not present

## 2019-01-14 DIAGNOSIS — E782 Mixed hyperlipidemia: Secondary | ICD-10-CM | POA: Diagnosis not present

## 2019-01-14 DIAGNOSIS — I2510 Atherosclerotic heart disease of native coronary artery without angina pectoris: Secondary | ICD-10-CM | POA: Diagnosis not present

## 2019-01-14 DIAGNOSIS — E039 Hypothyroidism, unspecified: Secondary | ICD-10-CM | POA: Diagnosis not present

## 2019-01-14 DIAGNOSIS — I252 Old myocardial infarction: Secondary | ICD-10-CM | POA: Diagnosis not present

## 2019-01-14 DIAGNOSIS — F0390 Unspecified dementia without behavioral disturbance: Secondary | ICD-10-CM | POA: Diagnosis not present

## 2019-01-14 DIAGNOSIS — F068 Other specified mental disorders due to known physiological condition: Secondary | ICD-10-CM | POA: Diagnosis not present

## 2019-01-14 DIAGNOSIS — M179 Osteoarthritis of knee, unspecified: Secondary | ICD-10-CM | POA: Diagnosis not present

## 2019-01-14 DIAGNOSIS — I10 Essential (primary) hypertension: Secondary | ICD-10-CM | POA: Diagnosis not present

## 2019-02-05 DIAGNOSIS — E782 Mixed hyperlipidemia: Secondary | ICD-10-CM | POA: Diagnosis not present

## 2019-02-05 DIAGNOSIS — Z5181 Encounter for therapeutic drug level monitoring: Secondary | ICD-10-CM | POA: Diagnosis not present

## 2019-02-20 DIAGNOSIS — F0390 Unspecified dementia without behavioral disturbance: Secondary | ICD-10-CM | POA: Diagnosis not present

## 2019-02-20 DIAGNOSIS — I252 Old myocardial infarction: Secondary | ICD-10-CM | POA: Diagnosis not present

## 2019-02-20 DIAGNOSIS — I10 Essential (primary) hypertension: Secondary | ICD-10-CM | POA: Diagnosis not present

## 2019-02-20 DIAGNOSIS — F068 Other specified mental disorders due to known physiological condition: Secondary | ICD-10-CM | POA: Diagnosis not present

## 2019-02-20 DIAGNOSIS — I2510 Atherosclerotic heart disease of native coronary artery without angina pectoris: Secondary | ICD-10-CM | POA: Diagnosis not present

## 2019-02-20 DIAGNOSIS — E782 Mixed hyperlipidemia: Secondary | ICD-10-CM | POA: Diagnosis not present

## 2019-02-20 DIAGNOSIS — N400 Enlarged prostate without lower urinary tract symptoms: Secondary | ICD-10-CM | POA: Diagnosis not present

## 2019-02-20 DIAGNOSIS — M179 Osteoarthritis of knee, unspecified: Secondary | ICD-10-CM | POA: Diagnosis not present

## 2019-02-20 DIAGNOSIS — E039 Hypothyroidism, unspecified: Secondary | ICD-10-CM | POA: Diagnosis not present

## 2019-06-03 DIAGNOSIS — I2510 Atherosclerotic heart disease of native coronary artery without angina pectoris: Secondary | ICD-10-CM | POA: Diagnosis not present

## 2019-06-03 DIAGNOSIS — I252 Old myocardial infarction: Secondary | ICD-10-CM | POA: Diagnosis not present

## 2019-06-03 DIAGNOSIS — N400 Enlarged prostate without lower urinary tract symptoms: Secondary | ICD-10-CM | POA: Diagnosis not present

## 2019-06-03 DIAGNOSIS — E039 Hypothyroidism, unspecified: Secondary | ICD-10-CM | POA: Diagnosis not present

## 2019-06-03 DIAGNOSIS — F0390 Unspecified dementia without behavioral disturbance: Secondary | ICD-10-CM | POA: Diagnosis not present

## 2019-06-03 DIAGNOSIS — E782 Mixed hyperlipidemia: Secondary | ICD-10-CM | POA: Diagnosis not present

## 2019-06-03 DIAGNOSIS — M179 Osteoarthritis of knee, unspecified: Secondary | ICD-10-CM | POA: Diagnosis not present

## 2019-06-03 DIAGNOSIS — F068 Other specified mental disorders due to known physiological condition: Secondary | ICD-10-CM | POA: Diagnosis not present

## 2019-06-03 DIAGNOSIS — R152 Fecal urgency: Secondary | ICD-10-CM | POA: Diagnosis not present

## 2019-06-03 DIAGNOSIS — R4589 Other symptoms and signs involving emotional state: Secondary | ICD-10-CM | POA: Diagnosis not present

## 2019-06-03 DIAGNOSIS — I10 Essential (primary) hypertension: Secondary | ICD-10-CM | POA: Diagnosis not present

## 2019-06-09 ENCOUNTER — Emergency Department (HOSPITAL_COMMUNITY): Payer: PPO

## 2019-06-09 ENCOUNTER — Encounter (HOSPITAL_COMMUNITY): Payer: Self-pay | Admitting: Emergency Medicine

## 2019-06-09 ENCOUNTER — Emergency Department (HOSPITAL_COMMUNITY)
Admission: EM | Admit: 2019-06-09 | Discharge: 2019-06-09 | Disposition: A | Discharge status: Home / Self Care | Payer: PPO | Attending: Emergency Medicine | Admitting: Emergency Medicine

## 2019-06-09 ENCOUNTER — Other Ambulatory Visit: Payer: Self-pay

## 2019-06-09 DIAGNOSIS — R0902 Hypoxemia: Secondary | ICD-10-CM | POA: Diagnosis not present

## 2019-06-09 DIAGNOSIS — R079 Chest pain, unspecified: Secondary | ICD-10-CM | POA: Diagnosis not present

## 2019-06-09 DIAGNOSIS — Z5321 Procedure and treatment not carried out due to patient leaving prior to being seen by health care provider: Secondary | ICD-10-CM | POA: Insufficient documentation

## 2019-06-09 DIAGNOSIS — R0789 Other chest pain: Secondary | ICD-10-CM | POA: Diagnosis not present

## 2019-06-09 DIAGNOSIS — I10 Essential (primary) hypertension: Secondary | ICD-10-CM | POA: Diagnosis not present

## 2019-06-09 LAB — BASIC METABOLIC PANEL
Anion gap: 9 (ref 5–15)
BUN: 10 mg/dL (ref 8–23)
CO2: 28 mmol/L (ref 22–32)
Calcium: 10 mg/dL (ref 8.9–10.3)
Chloride: 103 mmol/L (ref 98–111)
Creatinine, Ser: 0.88 mg/dL (ref 0.61–1.24)
GFR calc Af Amer: >60 mL/min (ref >60)
GFR calc non Af Amer: >60 mL/min (ref >60)
Glucose, Bld: 102 mg/dL — ABNORMAL HIGH (ref 70–99)
Potassium: 4 mmol/L (ref 3.5–5.1)
Sodium: 140 mmol/L (ref 135–145)

## 2019-06-09 LAB — CBC
HCT: 45.2 % (ref 39.0–52.0)
Hemoglobin: 15.3 g/dL (ref 13.0–17.0)
MCH: 31.5 pg (ref 26.0–34.0)
MCHC: 33.8 g/dL (ref 30.0–36.0)
MCV: 93 fL (ref 80.0–100.0)
Platelets: 181 10*3/uL (ref 150–400)
RBC: 4.86 MIL/uL (ref 4.22–5.81)
RDW: 12.9 % (ref 11.5–15.5)
WBC: 6.4 10*3/uL (ref 4.0–10.5)
nRBC: 0 % (ref 0.0–0.2)

## 2019-06-09 LAB — TROPONIN I (HIGH SENSITIVITY)
Troponin I (High Sensitivity): 7 ng/L (ref <18)
Troponin I (High Sensitivity): 7 ng/L (ref <18)

## 2019-06-09 NOTE — ED Notes (Signed)
Registration told this EMT that the patient was requesting his IV be taken out so he can leave. IV removed from pt and pt states that his ride is on the way.

## 2019-06-09 NOTE — ED Triage Notes (Signed)
Pt arrives via EMS with chest pain since yesterday. Sharp pain worse with movement that radiates to left shoulder. 324 ASA and 1 nitro. Hypertensive for EMS  Wife [phone], [phone]

## 2019-06-27 DIAGNOSIS — E039 Hypothyroidism, unspecified: Secondary | ICD-10-CM | POA: Diagnosis not present

## 2019-06-27 DIAGNOSIS — F320 Major depressive disorder, single episode, mild: Secondary | ICD-10-CM | POA: Diagnosis not present

## 2019-06-27 DIAGNOSIS — F068 Other specified mental disorders due to known physiological condition: Secondary | ICD-10-CM | POA: Diagnosis not present

## 2019-06-27 DIAGNOSIS — F0390 Unspecified dementia without behavioral disturbance: Secondary | ICD-10-CM | POA: Diagnosis not present

## 2019-06-27 DIAGNOSIS — I252 Old myocardial infarction: Secondary | ICD-10-CM | POA: Diagnosis not present

## 2019-06-27 DIAGNOSIS — N400 Enlarged prostate without lower urinary tract symptoms: Secondary | ICD-10-CM | POA: Diagnosis not present

## 2019-06-27 DIAGNOSIS — I10 Essential (primary) hypertension: Secondary | ICD-10-CM | POA: Diagnosis not present

## 2019-06-27 DIAGNOSIS — I2510 Atherosclerotic heart disease of native coronary artery without angina pectoris: Secondary | ICD-10-CM | POA: Diagnosis not present

## 2019-06-27 DIAGNOSIS — M179 Osteoarthritis of knee, unspecified: Secondary | ICD-10-CM | POA: Diagnosis not present

## 2019-06-27 DIAGNOSIS — E782 Mixed hyperlipidemia: Secondary | ICD-10-CM | POA: Diagnosis not present

## 2019-07-15 DIAGNOSIS — F0390 Unspecified dementia without behavioral disturbance: Secondary | ICD-10-CM | POA: Diagnosis not present

## 2019-07-15 DIAGNOSIS — F320 Major depressive disorder, single episode, mild: Secondary | ICD-10-CM | POA: Diagnosis not present

## 2019-09-10 DIAGNOSIS — N400 Enlarged prostate without lower urinary tract symptoms: Secondary | ICD-10-CM | POA: Diagnosis not present

## 2019-09-10 DIAGNOSIS — F068 Other specified mental disorders due to known physiological condition: Secondary | ICD-10-CM | POA: Diagnosis not present

## 2019-09-10 DIAGNOSIS — I10 Essential (primary) hypertension: Secondary | ICD-10-CM | POA: Diagnosis not present

## 2019-09-10 DIAGNOSIS — M179 Osteoarthritis of knee, unspecified: Secondary | ICD-10-CM | POA: Diagnosis not present

## 2019-09-10 DIAGNOSIS — I2510 Atherosclerotic heart disease of native coronary artery without angina pectoris: Secondary | ICD-10-CM | POA: Diagnosis not present

## 2019-09-10 DIAGNOSIS — E782 Mixed hyperlipidemia: Secondary | ICD-10-CM | POA: Diagnosis not present

## 2019-09-10 DIAGNOSIS — F320 Major depressive disorder, single episode, mild: Secondary | ICD-10-CM | POA: Diagnosis not present

## 2019-09-10 DIAGNOSIS — I252 Old myocardial infarction: Secondary | ICD-10-CM | POA: Diagnosis not present

## 2019-09-10 DIAGNOSIS — F0390 Unspecified dementia without behavioral disturbance: Secondary | ICD-10-CM | POA: Diagnosis not present

## 2019-09-10 DIAGNOSIS — E039 Hypothyroidism, unspecified: Secondary | ICD-10-CM | POA: Diagnosis not present

## 2019-12-05 DIAGNOSIS — E039 Hypothyroidism, unspecified: Secondary | ICD-10-CM | POA: Diagnosis not present

## 2019-12-05 DIAGNOSIS — R7301 Impaired fasting glucose: Secondary | ICD-10-CM | POA: Diagnosis not present

## 2019-12-05 DIAGNOSIS — F320 Major depressive disorder, single episode, mild: Secondary | ICD-10-CM | POA: Diagnosis not present

## 2019-12-05 DIAGNOSIS — I10 Essential (primary) hypertension: Secondary | ICD-10-CM | POA: Diagnosis not present

## 2019-12-05 DIAGNOSIS — Z0000 Encounter for general adult medical examination without abnormal findings: Secondary | ICD-10-CM | POA: Diagnosis not present

## 2019-12-05 DIAGNOSIS — Z1389 Encounter for screening for other disorder: Secondary | ICD-10-CM | POA: Diagnosis not present

## 2019-12-05 DIAGNOSIS — I2510 Atherosclerotic heart disease of native coronary artery without angina pectoris: Secondary | ICD-10-CM | POA: Diagnosis not present

## 2019-12-05 DIAGNOSIS — F0390 Unspecified dementia without behavioral disturbance: Secondary | ICD-10-CM | POA: Diagnosis not present

## 2019-12-05 DIAGNOSIS — E782 Mixed hyperlipidemia: Secondary | ICD-10-CM | POA: Diagnosis not present

## 2019-12-23 DIAGNOSIS — M179 Osteoarthritis of knee, unspecified: Secondary | ICD-10-CM | POA: Diagnosis not present

## 2019-12-23 DIAGNOSIS — I252 Old myocardial infarction: Secondary | ICD-10-CM | POA: Diagnosis not present

## 2019-12-23 DIAGNOSIS — E039 Hypothyroidism, unspecified: Secondary | ICD-10-CM | POA: Diagnosis not present

## 2019-12-23 DIAGNOSIS — F068 Other specified mental disorders due to known physiological condition: Secondary | ICD-10-CM | POA: Diagnosis not present

## 2019-12-23 DIAGNOSIS — I2510 Atherosclerotic heart disease of native coronary artery without angina pectoris: Secondary | ICD-10-CM | POA: Diagnosis not present

## 2019-12-23 DIAGNOSIS — I10 Essential (primary) hypertension: Secondary | ICD-10-CM | POA: Diagnosis not present

## 2019-12-23 DIAGNOSIS — F320 Major depressive disorder, single episode, mild: Secondary | ICD-10-CM | POA: Diagnosis not present

## 2019-12-23 DIAGNOSIS — E782 Mixed hyperlipidemia: Secondary | ICD-10-CM | POA: Diagnosis not present

## 2019-12-23 DIAGNOSIS — F0390 Unspecified dementia without behavioral disturbance: Secondary | ICD-10-CM | POA: Diagnosis not present

## 2019-12-23 DIAGNOSIS — N400 Enlarged prostate without lower urinary tract symptoms: Secondary | ICD-10-CM | POA: Diagnosis not present

## 2020-01-12 DIAGNOSIS — D485 Neoplasm of uncertain behavior of skin: Secondary | ICD-10-CM | POA: Diagnosis not present

## 2020-01-12 DIAGNOSIS — L218 Other seborrheic dermatitis: Secondary | ICD-10-CM | POA: Diagnosis not present

## 2020-01-12 DIAGNOSIS — D1801 Hemangioma of skin and subcutaneous tissue: Secondary | ICD-10-CM | POA: Diagnosis not present

## 2020-01-12 DIAGNOSIS — D225 Melanocytic nevi of trunk: Secondary | ICD-10-CM | POA: Diagnosis not present

## 2020-01-12 DIAGNOSIS — Z85828 Personal history of other malignant neoplasm of skin: Secondary | ICD-10-CM | POA: Diagnosis not present

## 2020-01-12 DIAGNOSIS — L821 Other seborrheic keratosis: Secondary | ICD-10-CM | POA: Diagnosis not present

## 2020-01-12 DIAGNOSIS — L720 Epidermal cyst: Secondary | ICD-10-CM | POA: Diagnosis not present

## 2020-01-15 DIAGNOSIS — F068 Other specified mental disorders due to known physiological condition: Secondary | ICD-10-CM | POA: Diagnosis not present

## 2020-01-15 DIAGNOSIS — I252 Old myocardial infarction: Secondary | ICD-10-CM | POA: Diagnosis not present

## 2020-01-15 DIAGNOSIS — F0390 Unspecified dementia without behavioral disturbance: Secondary | ICD-10-CM | POA: Diagnosis not present

## 2020-01-15 DIAGNOSIS — F320 Major depressive disorder, single episode, mild: Secondary | ICD-10-CM | POA: Diagnosis not present

## 2020-01-15 DIAGNOSIS — E782 Mixed hyperlipidemia: Secondary | ICD-10-CM | POA: Diagnosis not present

## 2020-01-15 DIAGNOSIS — N400 Enlarged prostate without lower urinary tract symptoms: Secondary | ICD-10-CM | POA: Diagnosis not present

## 2020-01-15 DIAGNOSIS — E039 Hypothyroidism, unspecified: Secondary | ICD-10-CM | POA: Diagnosis not present

## 2020-01-15 DIAGNOSIS — I2510 Atherosclerotic heart disease of native coronary artery without angina pectoris: Secondary | ICD-10-CM | POA: Diagnosis not present

## 2020-01-15 DIAGNOSIS — M179 Osteoarthritis of knee, unspecified: Secondary | ICD-10-CM | POA: Diagnosis not present

## 2020-01-15 DIAGNOSIS — I10 Essential (primary) hypertension: Secondary | ICD-10-CM | POA: Diagnosis not present

## 2020-02-07 ENCOUNTER — Ambulatory Visit: Payer: PPO | Attending: Internal Medicine

## 2020-02-07 DIAGNOSIS — Z23 Encounter for immunization: Secondary | ICD-10-CM

## 2020-02-07 NOTE — Progress Notes (Signed)
   Covid-[facility] Vaccination [facility]  Name:  ARIYON MITTLEMAN    MRN: [id number] DOB: 27-Jan-1936  02/07/2020  Mr. Karlen was observed post Covid-19 immunization for 15 minutes without incident. He was provided with Vaccine Information Sheet and instruction to access the V-Safe system.   Mr. [place] was instructed to call 911 with any severe reactions post vaccine: Marland Kitchen Difficulty breathing  . Swelling of face and throat  . A fast heartbeat  . A bad rash all over body  . Dizziness and weakness

## 2020-03-12 DIAGNOSIS — F320 Major depressive disorder, single episode, mild: Secondary | ICD-10-CM | POA: Diagnosis not present

## 2020-03-12 DIAGNOSIS — F0390 Unspecified dementia without behavioral disturbance: Secondary | ICD-10-CM | POA: Diagnosis not present

## 2020-04-12 DIAGNOSIS — F320 Major depressive disorder, single episode, mild: Secondary | ICD-10-CM | POA: Diagnosis not present

## 2020-04-12 DIAGNOSIS — F068 Other specified mental disorders due to known physiological condition: Secondary | ICD-10-CM | POA: Diagnosis not present

## 2020-04-12 DIAGNOSIS — H2513 Age-related nuclear cataract, bilateral: Secondary | ICD-10-CM | POA: Diagnosis not present

## 2020-04-12 DIAGNOSIS — M179 Osteoarthritis of knee, unspecified: Secondary | ICD-10-CM | POA: Diagnosis not present

## 2020-04-12 DIAGNOSIS — N400 Enlarged prostate without lower urinary tract symptoms: Secondary | ICD-10-CM | POA: Diagnosis not present

## 2020-04-12 DIAGNOSIS — D3131 Benign neoplasm of right choroid: Secondary | ICD-10-CM | POA: Diagnosis not present

## 2020-04-12 DIAGNOSIS — H43811 Vitreous degeneration, right eye: Secondary | ICD-10-CM | POA: Diagnosis not present

## 2020-04-12 DIAGNOSIS — D3132 Benign neoplasm of left choroid: Secondary | ICD-10-CM | POA: Diagnosis not present

## 2020-04-12 DIAGNOSIS — E039 Hypothyroidism, unspecified: Secondary | ICD-10-CM | POA: Diagnosis not present

## 2020-04-12 DIAGNOSIS — I2510 Atherosclerotic heart disease of native coronary artery without angina pectoris: Secondary | ICD-10-CM | POA: Diagnosis not present

## 2020-04-12 DIAGNOSIS — I252 Old myocardial infarction: Secondary | ICD-10-CM | POA: Diagnosis not present

## 2020-04-12 DIAGNOSIS — F0390 Unspecified dementia without behavioral disturbance: Secondary | ICD-10-CM | POA: Diagnosis not present

## 2020-04-12 DIAGNOSIS — I10 Essential (primary) hypertension: Secondary | ICD-10-CM | POA: Diagnosis not present

## 2020-04-12 DIAGNOSIS — G4700 Insomnia, unspecified: Secondary | ICD-10-CM | POA: Diagnosis not present

## 2020-04-12 DIAGNOSIS — E782 Mixed hyperlipidemia: Secondary | ICD-10-CM | POA: Diagnosis not present

## 2020-05-20 DIAGNOSIS — F320 Major depressive disorder, single episode, mild: Secondary | ICD-10-CM | POA: Diagnosis not present

## 2020-05-20 DIAGNOSIS — N400 Enlarged prostate without lower urinary tract symptoms: Secondary | ICD-10-CM | POA: Diagnosis not present

## 2020-05-20 DIAGNOSIS — I252 Old myocardial infarction: Secondary | ICD-10-CM | POA: Diagnosis not present

## 2020-05-20 DIAGNOSIS — E782 Mixed hyperlipidemia: Secondary | ICD-10-CM | POA: Diagnosis not present

## 2020-05-20 DIAGNOSIS — M179 Osteoarthritis of knee, unspecified: Secondary | ICD-10-CM | POA: Diagnosis not present

## 2020-05-20 DIAGNOSIS — F0390 Unspecified dementia without behavioral disturbance: Secondary | ICD-10-CM | POA: Diagnosis not present

## 2020-05-20 DIAGNOSIS — E039 Hypothyroidism, unspecified: Secondary | ICD-10-CM | POA: Diagnosis not present

## 2020-05-20 DIAGNOSIS — I10 Essential (primary) hypertension: Secondary | ICD-10-CM | POA: Diagnosis not present

## 2020-05-20 DIAGNOSIS — G4700 Insomnia, unspecified: Secondary | ICD-10-CM | POA: Diagnosis not present

## 2020-05-20 DIAGNOSIS — F068 Other specified mental disorders due to known physiological condition: Secondary | ICD-10-CM | POA: Diagnosis not present

## 2020-05-20 DIAGNOSIS — I2510 Atherosclerotic heart disease of native coronary artery without angina pectoris: Secondary | ICD-10-CM | POA: Diagnosis not present

## 2020-06-15 DIAGNOSIS — I10 Essential (primary) hypertension: Secondary | ICD-10-CM | POA: Diagnosis not present

## 2020-06-15 DIAGNOSIS — R197 Diarrhea, unspecified: Secondary | ICD-10-CM | POA: Diagnosis not present

## 2020-06-15 DIAGNOSIS — F320 Major depressive disorder, single episode, mild: Secondary | ICD-10-CM | POA: Diagnosis not present

## 2020-06-15 DIAGNOSIS — I2510 Atherosclerotic heart disease of native coronary artery without angina pectoris: Secondary | ICD-10-CM | POA: Diagnosis not present

## 2020-06-15 DIAGNOSIS — F0390 Unspecified dementia without behavioral disturbance: Secondary | ICD-10-CM | POA: Diagnosis not present

## 2020-10-21 DIAGNOSIS — F0390 Unspecified dementia without behavioral disturbance: Secondary | ICD-10-CM | POA: Diagnosis not present

## 2020-10-21 DIAGNOSIS — M179 Osteoarthritis of knee, unspecified: Secondary | ICD-10-CM | POA: Diagnosis not present

## 2020-10-21 DIAGNOSIS — E039 Hypothyroidism, unspecified: Secondary | ICD-10-CM | POA: Diagnosis not present

## 2020-10-21 DIAGNOSIS — I2510 Atherosclerotic heart disease of native coronary artery without angina pectoris: Secondary | ICD-10-CM | POA: Diagnosis not present

## 2020-10-21 DIAGNOSIS — I252 Old myocardial infarction: Secondary | ICD-10-CM | POA: Diagnosis not present

## 2020-10-21 DIAGNOSIS — E782 Mixed hyperlipidemia: Secondary | ICD-10-CM | POA: Diagnosis not present

## 2020-10-21 DIAGNOSIS — F068 Other specified mental disorders due to known physiological condition: Secondary | ICD-10-CM | POA: Diagnosis not present

## 2020-10-21 DIAGNOSIS — N400 Enlarged prostate without lower urinary tract symptoms: Secondary | ICD-10-CM | POA: Diagnosis not present

## 2020-10-21 DIAGNOSIS — G4700 Insomnia, unspecified: Secondary | ICD-10-CM | POA: Diagnosis not present

## 2020-10-21 DIAGNOSIS — I10 Essential (primary) hypertension: Secondary | ICD-10-CM | POA: Diagnosis not present

## 2020-10-21 DIAGNOSIS — F320 Major depressive disorder, single episode, mild: Secondary | ICD-10-CM | POA: Diagnosis not present

## 2020-11-15 DIAGNOSIS — M25562 Pain in left knee: Secondary | ICD-10-CM | POA: Diagnosis not present

## 2020-11-15 DIAGNOSIS — M25561 Pain in right knee: Secondary | ICD-10-CM | POA: Diagnosis not present

## 2020-12-09 DIAGNOSIS — E039 Hypothyroidism, unspecified: Secondary | ICD-10-CM | POA: Diagnosis not present

## 2020-12-09 DIAGNOSIS — Z0000 Encounter for general adult medical examination without abnormal findings: Secondary | ICD-10-CM | POA: Diagnosis not present

## 2020-12-09 DIAGNOSIS — I2510 Atherosclerotic heart disease of native coronary artery without angina pectoris: Secondary | ICD-10-CM | POA: Diagnosis not present

## 2020-12-09 DIAGNOSIS — I10 Essential (primary) hypertension: Secondary | ICD-10-CM | POA: Diagnosis not present

## 2020-12-09 DIAGNOSIS — Z1389 Encounter for screening for other disorder: Secondary | ICD-10-CM | POA: Diagnosis not present

## 2020-12-09 DIAGNOSIS — F3341 Major depressive disorder, recurrent, in partial remission: Secondary | ICD-10-CM | POA: Diagnosis not present

## 2020-12-09 DIAGNOSIS — F0390 Unspecified dementia without behavioral disturbance: Secondary | ICD-10-CM | POA: Diagnosis not present

## 2020-12-09 DIAGNOSIS — E782 Mixed hyperlipidemia: Secondary | ICD-10-CM | POA: Diagnosis not present

## 2020-12-13 DIAGNOSIS — M25561 Pain in right knee: Secondary | ICD-10-CM | POA: Diagnosis not present

## 2020-12-15 ENCOUNTER — Other Ambulatory Visit (HOSPITAL_COMMUNITY): Payer: Self-pay | Admitting: Orthopedic Surgery

## 2020-12-15 ENCOUNTER — Inpatient Hospital Stay (HOSPITAL_COMMUNITY): Admission: RE | Admit: 2020-12-15 | Payer: PPO | Source: Ambulatory Visit

## 2020-12-15 DIAGNOSIS — R52 Pain, unspecified: Secondary | ICD-10-CM

## 2021-01-05 ENCOUNTER — Ambulatory Visit (HOSPITAL_COMMUNITY)
Admission: RE | Admit: 2021-01-05 | Discharge: 2021-01-05 | Disposition: A | Discharge status: Home / Self Care | Payer: PPO | Source: Ambulatory Visit | Attending: Orthopedic Surgery | Admitting: Orthopedic Surgery

## 2021-01-05 ENCOUNTER — Other Ambulatory Visit: Payer: Self-pay

## 2021-01-05 DIAGNOSIS — R52 Pain, unspecified: Secondary | ICD-10-CM | POA: Insufficient documentation

## 2021-01-17 DIAGNOSIS — M25561 Pain in right knee: Secondary | ICD-10-CM | POA: Diagnosis not present

## 2021-01-17 NOTE — Progress Notes (Signed)
[facility] [place]  ASSESSMENT / PLAN: Timothy Arias is a 85 y.o. male with atherosclerosis of native arteries of right lower extremity causing intermittent claudication.  Patient counseled patients with asymptomatic peripheral arterial disease or claudication have a 1-2% risk of developing chronic limb threatening ischemia, but a 15-30% risk of mortality in the next 5 years. Intervention should only be considered for medically optimized patients with disabling symptoms.   Recommend the following which can slow the progression of atherosclerosis and reduce the risk of major adverse cardiac / limb events:  Complete cessation from all tobacco products. Blood glucose control with goal A1c < 7%. Blood pressure control with goal blood pressure < 140/90 mmHg. Lipid reduction therapy with goal LDL-C <100 mg/dL (<70 if symptomatic from PAD).  Aspirin 81mg  PO QD.  Atorvastatin 40-80mg  PO QD (or other "high intensity" statin therapy). Daily walking to and past the point of discomfort. Patient counseled to keep a log of exercise distance. Adequate hydration (at least 2 liters / day) if patient's heart and kidney function is adequate.  Follow up in 1 year with repeat ABI.  CHIEF COMPLAINT: right leg cramping  HISTORY OF PRESENT ILLNESS: Timothy Arias is a 85 y.o. male who presents to clinic for evaluation of right calf discomfort with walking.  The patient reports fairly classic symptoms of intermittent claudication.  He does not have pain at rest.  He has no ulcers or wounds about his feet.  The pain is fairly reproducible, occurring after walking to the mailbox on his way back to the house.  If he rests the pain resolves.  VASCULAR SURGICAL HISTORY: none  VASCULAR RISK FACTORS: Negative history of stroke / transient ischemic attack. Positive history of coronary artery disease. + history of PCI. Negative history of diabetes mellitus. No recent A1c. Positive history of  smoking. Not actively smoking. Positive history of hypertension.  Negative history of chronic kidney disease.  Last GFR >60.  Negative history of chronic obstructive pulmonary disease.  FUNCTIONAL STATUS: ECOG performance status: (0) Fully active, able to carry on all predisease performance without restriction Ambulatory status: Ambulatory within the community without limits  Past Medical History:  Diagnosis Date   BPH (benign prostatic hyperplasia)    Colon polyps    Coronary artery disease    Dyslipidemia    ED (erectile dysfunction)    Hydrocele    Hyperlipidemia    Hypertension    MI (myocardial infarction) ([place]) 1992   Nocturnal enuresis     Past Surgical History:  Procedure Laterality Date   COLONOSCOPY W/ POLYPECTOMY     CORONARY ANGIOPLASTY     with stents   KNEE ARTHROSCOPY     RECTAL VILLOUS ADENOMA EXCISION     VASECTOMY      History reviewed. No pertinent family history.  Social History   Socioeconomic History   Marital status: Married    Spouse name: Not on file   Number of children: Not on file   Years of education: Not on file   Highest education level: Not on file  Occupational History   Not on file  Tobacco Use   Smoking status: Former    Packs/day: 1.00    Years: 60.00    Pack years: 60.00    Types: Cigarettes    Quit date: 08/17/1990    Years since quitting: 30.4   Smokeless tobacco: Current    Types: Snuff   Tobacco comments:    quit 1992  Substance  and Sexual Activity   Alcohol use: Yes    Comment: occasionally   Drug use: No   Sexual activity: Not on file  Other Topics Concern   Not on file  Social History Narrative   Not on file   Social Determinants of Health   Financial Resource Strain: Not on file  Food Insecurity: Not on file  Transportation Needs: Not on file  Physical Activity: Not on file  Stress: Not on file  Social Connections: Not on file  Intimate Partner Violence: Not on file    No Known  Allergies  Current Outpatient Medications  Medication Sig Dispense Refill   amLODipine (NORVASC) 5 MG tablet Take 5 mg by mouth at bedtime.     aspirin 325 MG tablet Take 325 mg by mouth daily.     atorvastatin (LIPITOR) 10 MG tablet Take 10 mg by mouth daily.     citalopram (CELEXA) 20 MG tablet Take 20 mg by mouth daily.     galantamine (RAZADYNE ER) 8 MG 24 hr capsule Take 8 mg by mouth every morning.     metoprolol succinate (TOPROL-XL) 50 MG 24 hr tablet Take 50 mg by mouth at bedtime.     Multiple Vitamin (MULTIVITAMIN WITH MINERALS) TABS Take 1 tablet by mouth daily.     No current facility-administered medications for this visit.    REVIEW OF SYSTEMS:  [X]  denotes positive finding, [ ]  denotes negative finding Cardiac  Comments:  Chest pain or chest pressure:    Shortness of breath upon exertion:    Short of breath when lying flat:    Irregular heart rhythm:        Vascular    Pain in calf, thigh, or hip brought on by ambulation: x   Pain in feet at night that wakes you up from your sleep:     Blood clot in your veins:    Leg swelling:         Pulmonary    Oxygen at home:    Productive cough:     Wheezing:         Neurologic    Sudden weakness in arms or legs:     Sudden numbness in arms or legs:     Sudden onset of difficulty speaking or slurred speech:    Temporary loss of vision in one eye:     Problems with dizziness:         Gastrointestinal    Blood in stool:     Vomited blood:         Genitourinary    Burning when urinating:     Blood in urine:        Psychiatric    Major depression:         Hematologic    Bleeding problems:    Problems with blood clotting too easily:        Skin    Rashes or ulcers:        Constitutional    Fever or chills:      PHYSICAL EXAM Vitals:   01/18/21 1107  BP: 133/68  Pulse: (!) 59  Resp: 20  Temp: 98.6 F (37 C)  SpO2: 97%  Weight: 227 lb (103 kg)  Height: 6' (1.829 m)    Constitutional: well  appearing. no distress. Appears well nourished.  Neurologic: CN intact. no focal findings. no sensory loss. Psychiatric:  Mood and affect symmetric and appropriate. Eyes:  No icterus. No conjunctival pallor. Ears, nose,  throat:  mucous membranes moist. Midline trachea.  Cardiac: regular rate and rhythm.  Respiratory:  unlabored. Abdominal:  soft, non-tender, non-distended.  Peripheral vascular: no palpable pedal pulses. No palpable popliteal pulses. Feet are warm. Extremity: no edema. no cyanosis. no pallor.  Skin: no gangrene. no ulceration.  Lymphatic: no Stemmer's sign. no palpable lymphadenopathy.  PERTINENT LABORATORY AND RADIOLOGIC DATA  Most recent CBC CBC Latest Ref Rng & Units 06/09/2019 01/12/2012  WBC 4.0 - 10.5 K/uL 6.4 7.1  Hemoglobin 13.0 - 17.0 g/dL 15.3 16.1  Hematocrit 39.0 - 52.0 % 45.2 44.7  Platelets 150 - 400 K/uL 181 176     Most recent CMP CMP Latest Ref Rng & Units 06/09/2019 01/12/2012  Glucose 70 - 99 mg/dL 102(H) 130(H)  BUN 8 - 23 mg/dL 10 8  Creatinine 0.61 - 1.24 mg/dL 0.88 0.73  Sodium 135 - 145 mmol/L 140 137  Potassium 3.5 - 5.1 mmol/L 4.0 3.8  Chloride 98 - 111 mmol/L 103 99  CO2 22 - 32 mmol/L 28 26  Calcium 8.9 - 10.3 mg/dL 10.0 11.0(H)  Total Protein 6.0 - 8.3 g/dL - 8.0  Total Bilirubin 0.3 - 1.2 mg/dL - 0.9  Alkaline Phos 39 - 117 U/L - 108  AST 0 - 37 U/L - 29  ALT 0 - 53 U/L - 44   LOWER EXTREMITY DOPPLER STUDY   Patient Name:  Timothy Arias  Date of Exam:   01/05/2021  Medical Rec #: [id number]       Accession #:    [id number]  Date of Birth: Mar 18, 1936       Patient Gender: M  Patient Age:   72 years  Exam Location:  [facility]  Procedure:      VAS [place] ABI WITH/WO TBI  Referring Phys:    ---------------------------------------------------------------------------  -----     Indications: Claudication. Bilateral.   High Risk Factors: Hypertension, hyperlipidemia, past history of smoking.     Performing  Technologist: Ronal Fear RVS, RCS      Examination Guidelines: A complete evaluation includes at minimum, Doppler  waveform signals and systolic blood pressure reading at the level of  bilateral  brachial, anterior tibial, and posterior tibial arteries, when vessel  segments  are accessible. Bilateral testing is considered an integral part of a  complete  examination. Photoelectric Plethysmograph (PPG) waveforms and toe systolic  pressure readings are included as required and additional duplex testing  as  needed. Limited examinations for reoccurring indications may be performed  as  noted.      ABI Findings:  +---------+------------------+-----+----------+--------+  Right    Rt Pressure (mmHg)IndexWaveform  Comment   +---------+------------------+-----+----------+--------+  Brachial 175                                        +---------+------------------+-----+----------+--------+  PTA      120               0.69 monophasic          +---------+------------------+-----+----------+--------+  DP       114               0.65 monophasic          +---------+------------------+-----+----------+--------+  Great Toe94                0.54                     +---------+------------------+-----+----------+--------+   +---------+------------------+-----+---------+-------+  Left     Lt Pressure (mmHg)IndexWaveform Comment  +---------+------------------+-----+---------+-------+  Brachial 175                                      +---------+------------------+-----+---------+-------+  PTA      164               0.94 triphasic         +---------+------------------+-----+---------+-------+  DP       154               0.88 biphasic          +---------+------------------+-----+---------+-------+  [place]               0.67                   +---------+------------------+-----+---------+-------+    +-------+-----------+-----------+------------+------------+  ABI/TBIToday's ABIToday's TBIPrevious ABIPrevious TBI  +-------+-----------+-----------+------------+------------+  Right  0.69       0.54       0.88        not obtained  +-------+-----------+-----------+------------+------------+  Left   0.94       0.67       0.99        not obtained  +-------+-----------+-----------+------------+------------+          Compared to prior study on 01/23/2018.     Summary:  Right: Resting right ankle-brachial index indicates moderate right lower  extremity arterial disease. The right toe-brachial index is abnormal.   Left: Resting left ankle-brachial index indicates mild left lower  extremity arterial disease. The left toe-brachial index is abnormal.       *See table(s) above for measurements and observations.      Yevonne Aline. Stanford Breed, MD [facility] Phone Number: [phone] 01/18/2021 11:42 AM  Total time spent on preparing this encounter including chart review, data review, collecting history, examining the patient, coordinating care for this new patient, 60 minutes.  Portions of this report may have been transcribed using voice recognition software.  Every effort has been made to ensure accuracy; however, inadvertent computerized transcription errors may still be present.

## 2021-01-18 ENCOUNTER — Ambulatory Visit: Payer: PPO | Admitting: Vascular Surgery

## 2021-01-18 ENCOUNTER — Encounter: Payer: Self-pay | Admitting: Vascular Surgery

## 2021-01-18 ENCOUNTER — Other Ambulatory Visit: Payer: Self-pay

## 2021-01-18 VITALS — BP 133/68 | HR 59 | Temp 98.6°F | Resp 20 | Ht 72.0 in | Wt 227.0 lb

## 2021-01-18 DIAGNOSIS — I70211 Atherosclerosis of native arteries of extremities with intermittent claudication, right leg: Secondary | ICD-10-CM

## 2021-03-21 DIAGNOSIS — I252 Old myocardial infarction: Secondary | ICD-10-CM | POA: Diagnosis not present

## 2021-03-21 DIAGNOSIS — E782 Mixed hyperlipidemia: Secondary | ICD-10-CM | POA: Diagnosis not present

## 2021-03-21 DIAGNOSIS — G4700 Insomnia, unspecified: Secondary | ICD-10-CM | POA: Diagnosis not present

## 2021-03-21 DIAGNOSIS — I10 Essential (primary) hypertension: Secondary | ICD-10-CM | POA: Diagnosis not present

## 2021-03-21 DIAGNOSIS — M179 Osteoarthritis of knee, unspecified: Secondary | ICD-10-CM | POA: Diagnosis not present

## 2021-03-21 DIAGNOSIS — N400 Enlarged prostate without lower urinary tract symptoms: Secondary | ICD-10-CM | POA: Diagnosis not present

## 2021-03-21 DIAGNOSIS — F068 Other specified mental disorders due to known physiological condition: Secondary | ICD-10-CM | POA: Diagnosis not present

## 2021-03-21 DIAGNOSIS — F3341 Major depressive disorder, recurrent, in partial remission: Secondary | ICD-10-CM | POA: Diagnosis not present

## 2021-03-21 DIAGNOSIS — E039 Hypothyroidism, unspecified: Secondary | ICD-10-CM | POA: Diagnosis not present

## 2021-03-21 DIAGNOSIS — F0390 Unspecified dementia without behavioral disturbance: Secondary | ICD-10-CM | POA: Diagnosis not present

## 2021-03-21 DIAGNOSIS — I2510 Atherosclerotic heart disease of native coronary artery without angina pectoris: Secondary | ICD-10-CM | POA: Diagnosis not present

## 2021-03-21 DIAGNOSIS — F320 Major depressive disorder, single episode, mild: Secondary | ICD-10-CM | POA: Diagnosis not present

## 2021-06-07 DIAGNOSIS — E782 Mixed hyperlipidemia: Secondary | ICD-10-CM | POA: Diagnosis not present

## 2021-06-07 DIAGNOSIS — I2510 Atherosclerotic heart disease of native coronary artery without angina pectoris: Secondary | ICD-10-CM | POA: Diagnosis not present

## 2021-06-07 DIAGNOSIS — I10 Essential (primary) hypertension: Secondary | ICD-10-CM | POA: Diagnosis not present

## 2021-06-07 DIAGNOSIS — Z23 Encounter for immunization: Secondary | ICD-10-CM | POA: Diagnosis not present

## 2021-06-07 DIAGNOSIS — R159 Full incontinence of feces: Secondary | ICD-10-CM | POA: Diagnosis not present

## 2021-06-07 DIAGNOSIS — I739 Peripheral vascular disease, unspecified: Secondary | ICD-10-CM | POA: Diagnosis not present

## 2021-06-07 DIAGNOSIS — F0390 Unspecified dementia without behavioral disturbance: Secondary | ICD-10-CM | POA: Diagnosis not present

## 2021-06-07 DIAGNOSIS — R152 Fecal urgency: Secondary | ICD-10-CM | POA: Diagnosis not present

## 2021-07-22 ENCOUNTER — Other Ambulatory Visit: Payer: Self-pay

## 2021-07-22 DIAGNOSIS — I70211 Atherosclerosis of native arteries of extremities with intermittent claudication, right leg: Secondary | ICD-10-CM

## 2021-07-24 ENCOUNTER — Other Ambulatory Visit: Payer: Self-pay

## 2021-07-24 DIAGNOSIS — I70211 Atherosclerosis of native arteries of extremities with intermittent claudication, right leg: Secondary | ICD-10-CM

## 2021-07-24 NOTE — Progress Notes (Signed)
Error

## 2021-07-25 ENCOUNTER — Ambulatory Visit (HOSPITAL_COMMUNITY)
Admission: RE | Admit: 2021-07-25 | Discharge: 2021-07-25 | Disposition: A | Discharge status: Home / Self Care | Payer: PPO | Source: Ambulatory Visit | Attending: Vascular Surgery | Admitting: Vascular Surgery

## 2021-07-25 DIAGNOSIS — I70211 Atherosclerosis of native arteries of extremities with intermittent claudication, right leg: Secondary | ICD-10-CM | POA: Diagnosis not present

## 2021-07-25 NOTE — Progress Notes (Signed)
[facility] [place] ? ?ASSESSMENT / PLAN: ?Timothy Arias is a 86 y.o. 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 ? ?Patient counseled patients with asymptomatic peripheral arterial disease or claudication have a 1-2% risk of developing chronic limb threatening ischemia, but a 15-30% risk of mortality in the next 5 years. Intervention should only be considered for medically optimized patients with disabling symptoms.  ? ?Recommend the following which can slow the progression of atherosclerosis and reduce the risk of major adverse cardiac / limb events:  ?Complete cessation from all tobacco products. ?Blood glucose control with goal A1c < 7%. ?Blood pressure control with goal blood pressure < 140/90 mmHg. ?Lipid reduction therapy with goal LDL-C <100 mg/dL (<70 if symptomatic from PAD).  ?Aspirin '81mg'$  PO QD.  ?Atorvastatin 40-'80mg'$  PO QD (or other "high intensity" statin therapy). ? ?Plan CT angiogram of abdomen and pelvis with runoff to evaluate possible inflow disease. I will call him with the results. ? ?CHIEF COMPLAINT: worsening right leg pain ? ?HISTORY OF PRESENT ILLNESS: ?Timothy Arias is a 86 y.o. male who presents to clinic for evaluation of right calf discomfort with walking.  The patient reports fairly classic symptoms of intermittent claudication.  He does not have pain at rest.  He has no ulcers or wounds about his feet.  The pain is fairly reproducible, occurring after walking to the mailbox on his way back to the house.  If he rests the pain resolves. ? ?07/26/21: Patient returns to clinic for follow-up.  His symptoms are unfortunately deteriorating.  He is able to walk less distance than previously.  He is becoming restricted in his independence by his symptoms.  He does not have symptoms of rest pain.  He does not have any ulceration about his foot. ?  ?VASCULAR SURGICAL HISTORY: none ?  ?VASCULAR RISK  FACTORS: ?Negative history of stroke / transient ischemic attack. ?Positive history of coronary artery disease. + history of PCI. Negative history of diabetes mellitus. No recent A1c. ?Positive history of smoking. Not actively smoking. ?Positive history of hypertension.  ?Negative history of chronic kidney disease.  Last GFR >60.  ?Negative history of chronic obstructive pulmonary disease. ?  ?FUNCTIONAL STATUS: ?ECOG performance status: (1) ?Ambulatory status: Ambulatory within the community with limits ? ?Past Medical History:  ?Diagnosis Date  ? BPH (benign prostatic hyperplasia)   ? Colon polyps   ? Coronary artery disease   ? Dyslipidemia   ? ED (erectile dysfunction)   ? Hydrocele   ? Hyperlipidemia   ? Hypertension   ? MI (myocardial infarction) ([place]) 1992  ? Nocturnal enuresis   ? ? ?Past Surgical History:  ?Procedure Laterality Date  ? COLONOSCOPY W/ POLYPECTOMY    ? CORONARY ANGIOPLASTY    ? with stents  ? KNEE ARTHROSCOPY    ? RECTAL VILLOUS ADENOMA EXCISION    ? VASECTOMY    ? ? ?History reviewed. No pertinent family history. ? ?Social History  ? ?Socioeconomic History  ? Marital status: Married  ?  Spouse name: Not on file  ? Number of children: Not on file  ? Years of education: Not on file  ? Highest education level: Not on file  ?Occupational History  ? Not on file  ?Tobacco Use  ? Smoking status: Former  ?  Packs/day: 1.00  ?  Years: 60.00  ?  Pack years: 60.00  ?  Types: Cigarettes  ?  Quit date: 08/17/1990  ?  Years since quitting: 30.9  ? Smokeless tobacco: Current  ?  Types: Snuff  ? Tobacco comments:  ?  quit 1992  ?Substance and Sexual Activity  ? Alcohol use: Yes  ?  Comment: occasionally  ? Drug use: No  ? Sexual activity: Not on file  ?Other Topics Concern  ? Not on file  ?Social History Narrative  ? Not on file  ? ?Social Determinants of Health  ? ?Financial Resource Strain: Not on file  ?Food Insecurity: Not on file  ?Transportation Needs: Not on file  ?Physical Activity: Not on file   ?Stress: Not on file  ?Social Connections: Not on file  ?Intimate Partner Violence: Not on file  ? ? ?No Known Allergies ? ?Current Outpatient Medications  ?Medication Sig Dispense Refill  ? amLODipine (NORVASC) 5 MG tablet Take 5 mg by mouth at bedtime.    ? aspirin 325 MG tablet Take 325 mg by mouth daily.    ? atorvastatin (LIPITOR) 10 MG tablet Take 10 mg by mouth daily.    ? citalopram (CELEXA) 20 MG tablet Take 20 mg by mouth daily.    ? galantamine (RAZADYNE ER) 8 MG 24 hr capsule Take 8 mg by mouth every morning.    ? metoprolol succinate (TOPROL-XL) 50 MG 24 hr tablet Take 50 mg by mouth at bedtime.    ? Multiple Vitamin (MULTIVITAMIN WITH MINERALS) TABS Take 1 tablet by mouth daily.    ? ?No current facility-administered medications for this visit.  ? ? ?PHYSICAL EXAM ?Vitals:  ? 07/26/21 1024  ?BP: 118/66  ?Pulse: 67  ?Resp: 20  ?Temp: 98.8 ?F (37.1 ?C)  ?SpO2: 97%  ?Weight: 227 lb 6.4 oz (103.1 kg)  ?Height: 6' (1.829 m)  ? ? ?Constitutional: Well-appearing elderly gentleman in no acute distress ?Cardiac: regular rate and rhythm.  ?Respiratory:  unlabored. ?Abdominal:  soft, non-tender, non-distended.  ?Peripheral vascular: 1+ left femoral pulse.  Absent right femoral pulse.  No palpable pedal pulses.  No interdigital or heel ulceration.  No ulceration about the the feet. ? ?PERTINENT LABORATORY AND RADIOLOGIC DATA ? ?Most recent CBC ? ?  Latest Ref Rng & Units 06/09/2019  ?  5:05 PM 01/12/2012  ? 11:45 PM  ?CBC  ?WBC 4.0 - 10.5 K/uL 6.4   7.1    ?Hemoglobin 13.0 - 17.0 g/dL 15.3   16.1    ?Hematocrit 39.0 - 52.0 % 45.2   44.7    ?Platelets 150 - 400 K/uL 181   176    ?  ? ?Most recent CMP ? ?  Latest Ref Rng & Units 06/09/2019  ?  5:05 PM 01/12/2012  ? 11:45 PM  ?CMP  ?Glucose 70 - 99 mg/dL 102   130    ?BUN 8 - 23 mg/dL 10   8    ?Creatinine 0.61 - 1.24 mg/dL 0.88   0.73    ?Sodium 135 - 145 mmol/L 140   137    ?Potassium 3.5 - 5.1 mmol/L 4.0   3.8    ?Chloride 98 - 111 mmol/L 103   99    ?CO2 22 - 32  mmol/L 28   26    ?Calcium 8.9 - 10.3 mg/dL 10.0   11.0    ?Total Protein 6.0 - 8.3 g/dL  8.0    ?Total Bilirubin 0.3 - 1.2 mg/dL  0.9    ?Alkaline Phos 39 - 117 U/L  108    ?AST 0 - 37 U/L  29    ?  ALT 0 - 53 U/L  44    ? ? ?+-------+-----------+-----------+------------+------------+  ?ABI/TBIToday's ABIToday's TBIPrevious ABIPrevious TBI  ?+-------+-----------+-----------+------------+------------+  ?Right  0.56       0.27       0.69        0.54          ?+-------+-----------+-----------+------------+------------+  ?Left   0.9        0.49       0.94        0.67          ?+-------+-----------+-----------+------------+------------+  ? ?Yevonne Aline. Stanford Breed, MD ?[facility] [place] ?Office Phone Number: [phone] ?07/26/2021 11:02 AM ? ?Total time spent on preparing this encounter including chart review, data review, collecting history, examining the patient, coordinating care for this established patient, 40 minutes. ? ?Portions of this report may have been transcribed using voice recognition software.  Every effort has been made to ensure accuracy; however, inadvertent computerized transcription errors may still be present. ? ? ? ?

## 2021-07-25 NOTE — H&P (View-Only) (Signed)
[facility] [place] ? ?ASSESSMENT / PLAN: ?Timothy Arias is a 86 y.o. 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 ? ?Patient counseled patients with asymptomatic peripheral arterial disease or claudication have a 1-2% risk of developing chronic limb threatening ischemia, but a 15-30% risk of mortality in the next 5 years. Intervention should only be considered for medically optimized patients with disabling symptoms.  ? ?Recommend the following which can slow the progression of atherosclerosis and reduce the risk of major adverse cardiac / limb events:  ?Complete cessation from all tobacco products. ?Blood glucose control with goal A1c < 7%. ?Blood pressure control with goal blood pressure < 140/90 mmHg. ?Lipid reduction therapy with goal LDL-C <100 mg/dL (<70 if symptomatic from PAD).  ?Aspirin '81mg'$  PO QD.  ?Atorvastatin 40-'80mg'$  PO QD (or other "high intensity" statin therapy). ? ?Plan CT angiogram of abdomen and pelvis with runoff to evaluate possible inflow disease. I will call him with the results. ? ?CHIEF COMPLAINT: worsening right leg pain ? ?HISTORY OF PRESENT ILLNESS: ?Timothy Arias is a 86 y.o. male who presents to clinic for evaluation of right calf discomfort with walking.  The patient reports fairly classic symptoms of intermittent claudication.  He does not have pain at rest.  He has no ulcers or wounds about his feet.  The pain is fairly reproducible, occurring after walking to the mailbox on his way back to the house.  If he rests the pain resolves. ? ?07/26/21: Patient returns to clinic for follow-up.  His symptoms are unfortunately deteriorating.  He is able to walk less distance than previously.  He is becoming restricted in his independence by his symptoms.  He does not have symptoms of rest pain.  He does not have any ulceration about his foot. ?  ?VASCULAR SURGICAL HISTORY: none ?  ?VASCULAR RISK  FACTORS: ?Negative history of stroke / transient ischemic attack. ?Positive history of coronary artery disease. + history of PCI. Negative history of diabetes mellitus. No recent A1c. ?Positive history of smoking. Not actively smoking. ?Positive history of hypertension.  ?Negative history of chronic kidney disease.  Last GFR >60.  ?Negative history of chronic obstructive pulmonary disease. ?  ?FUNCTIONAL STATUS: ?ECOG performance status: (1) ?Ambulatory status: Ambulatory within the community with limits ? ?Past Medical History:  ?Diagnosis Date  ? BPH (benign prostatic hyperplasia)   ? Colon polyps   ? Coronary artery disease   ? Dyslipidemia   ? ED (erectile dysfunction)   ? Hydrocele   ? Hyperlipidemia   ? Hypertension   ? MI (myocardial infarction) ([place]) 1992  ? Nocturnal enuresis   ? ? ?Past Surgical History:  ?Procedure Laterality Date  ? COLONOSCOPY W/ POLYPECTOMY    ? CORONARY ANGIOPLASTY    ? with stents  ? KNEE ARTHROSCOPY    ? RECTAL VILLOUS ADENOMA EXCISION    ? VASECTOMY    ? ? ?History reviewed. No pertinent family history. ? ?Social History  ? ?Socioeconomic History  ? Marital status: Married  ?  Spouse name: Not on file  ? Number of children: Not on file  ? Years of education: Not on file  ? Highest education level: Not on file  ?Occupational History  ? Not on file  ?Tobacco Use  ? Smoking status: Former  ?  Packs/day: 1.00  ?  Years: 60.00  ?  Pack years: 60.00  ?  Types: Cigarettes  ?  Quit date: 08/17/1990  ?  Years since quitting: 30.9  ? Smokeless tobacco: Current  ?  Types: Snuff  ? Tobacco comments:  ?  quit 1992  ?Substance and Sexual Activity  ? Alcohol use: Yes  ?  Comment: occasionally  ? Drug use: No  ? Sexual activity: Not on file  ?Other Topics Concern  ? Not on file  ?Social History Narrative  ? Not on file  ? ?Social Determinants of Health  ? ?Financial Resource Strain: Not on file  ?Food Insecurity: Not on file  ?Transportation Needs: Not on file  ?Physical Activity: Not on file   ?Stress: Not on file  ?Social Connections: Not on file  ?Intimate Partner Violence: Not on file  ? ? ?No Known Allergies ? ?Current Outpatient Medications  ?Medication Sig Dispense Refill  ? amLODipine (NORVASC) 5 MG tablet Take 5 mg by mouth at bedtime.    ? aspirin 325 MG tablet Take 325 mg by mouth daily.    ? atorvastatin (LIPITOR) 10 MG tablet Take 10 mg by mouth daily.    ? citalopram (CELEXA) 20 MG tablet Take 20 mg by mouth daily.    ? galantamine (RAZADYNE ER) 8 MG 24 hr capsule Take 8 mg by mouth every morning.    ? metoprolol succinate (TOPROL-XL) 50 MG 24 hr tablet Take 50 mg by mouth at bedtime.    ? Multiple Vitamin (MULTIVITAMIN WITH MINERALS) TABS Take 1 tablet by mouth daily.    ? ?No current facility-administered medications for this visit.  ? ? ?PHYSICAL EXAM ?Vitals:  ? 07/26/21 1024  ?BP: 118/66  ?Pulse: 67  ?Resp: 20  ?Temp: 98.8 ?F (37.1 ?C)  ?SpO2: 97%  ?Weight: 227 lb 6.4 oz (103.1 kg)  ?Height: 6' (1.829 m)  ? ? ?Constitutional: Well-appearing elderly gentleman in no acute distress ?Cardiac: regular rate and rhythm.  ?Respiratory:  unlabored. ?Abdominal:  soft, non-tender, non-distended.  ?Peripheral vascular: 1+ left femoral pulse.  Absent right femoral pulse.  No palpable pedal pulses.  No interdigital or heel ulceration.  No ulceration about the the feet. ? ?PERTINENT LABORATORY AND RADIOLOGIC DATA ? ?Most recent CBC ? ?  Latest Ref Rng & Units 06/09/2019  ?  5:05 PM 01/12/2012  ? 11:45 PM  ?CBC  ?WBC 4.0 - 10.5 K/uL 6.4   7.1    ?Hemoglobin 13.0 - 17.0 g/dL 15.3   16.1    ?Hematocrit 39.0 - 52.0 % 45.2   44.7    ?Platelets 150 - 400 K/uL 181   176    ?  ? ?Most recent CMP ? ?  Latest Ref Rng & Units 06/09/2019  ?  5:05 PM 01/12/2012  ? 11:45 PM  ?CMP  ?Glucose 70 - 99 mg/dL 102   130    ?BUN 8 - 23 mg/dL 10   8    ?Creatinine 0.61 - 1.24 mg/dL 0.88   0.73    ?Sodium 135 - 145 mmol/L 140   137    ?Potassium 3.5 - 5.1 mmol/L 4.0   3.8    ?Chloride 98 - 111 mmol/L 103   99    ?CO2 22 - 32  mmol/L 28   26    ?Calcium 8.9 - 10.3 mg/dL 10.0   11.0    ?Total Protein 6.0 - 8.3 g/dL  8.0    ?Total Bilirubin 0.3 - 1.2 mg/dL  0.9    ?Alkaline Phos 39 - 117 U/L  108    ?AST 0 - 37 U/L  29    ?  ALT 0 - 53 U/L  44    ? ? ?+-------+-----------+-----------+------------+------------+  ?ABI/TBIToday's ABIToday's TBIPrevious ABIPrevious TBI  ?+-------+-----------+-----------+------------+------------+  ?Right  0.56       0.27       0.69        0.54          ?+-------+-----------+-----------+------------+------------+  ?Left   0.9        0.49       0.94        0.67          ?+-------+-----------+-----------+------------+------------+  ? ?Yevonne Aline. Stanford Breed, MD ?[facility] [place] ?Office Phone Number: [phone] ?07/26/2021 11:02 AM ? ?Total time spent on preparing this encounter including chart review, data review, collecting history, examining the patient, coordinating care for this established patient, 40 minutes. ? ?Portions of this report may have been transcribed using voice recognition software.  Every effort has been made to ensure accuracy; however, inadvertent computerized transcription errors may still be present. ? ? ? ?

## 2021-07-26 ENCOUNTER — Encounter: Payer: Self-pay | Admitting: Vascular Surgery

## 2021-07-26 ENCOUNTER — Other Ambulatory Visit: Payer: Self-pay

## 2021-07-26 ENCOUNTER — Ambulatory Visit: Payer: PPO | Admitting: Vascular Surgery

## 2021-07-26 VITALS — BP 118/66 | HR 67 | Temp 98.8°F | Resp 20 | Ht 72.0 in | Wt 227.4 lb

## 2021-07-26 DIAGNOSIS — I70213 Atherosclerosis of native arteries of extremities with intermittent claudication, bilateral legs: Secondary | ICD-10-CM

## 2021-08-12 ENCOUNTER — Ambulatory Visit
Admission: RE | Admit: 2021-08-12 | Discharge: 2021-08-12 | Disposition: A | Discharge status: Home / Self Care | Payer: PPO | Source: Ambulatory Visit | Attending: Vascular Surgery | Admitting: Vascular Surgery

## 2021-08-12 DIAGNOSIS — I745 Embolism and thrombosis of iliac artery: Secondary | ICD-10-CM | POA: Diagnosis not present

## 2021-08-12 DIAGNOSIS — N2889 Other specified disorders of kidney and ureter: Secondary | ICD-10-CM | POA: Diagnosis not present

## 2021-08-12 DIAGNOSIS — I701 Atherosclerosis of renal artery: Secondary | ICD-10-CM | POA: Diagnosis not present

## 2021-08-12 DIAGNOSIS — I70213 Atherosclerosis of native arteries of extremities with intermittent claudication, bilateral legs: Secondary | ICD-10-CM

## 2021-08-12 DIAGNOSIS — K4020 Bilateral inguinal hernia, without obstruction or gangrene, not specified as recurrent: Secondary | ICD-10-CM | POA: Diagnosis not present

## 2021-08-12 IMAGING — CT CT ANGIO AOBIFEM WO/W CM
2 of 3 series · 3 of 16 positions shown, 4 images · non-contrast
Comparison: 01/13/2012

CLINICAL DATA: 85-year-old male with claudication

EXAM:
CT ANGIOGRAPHY OF ABDOMINAL AORTA WITH ILIOFEMORAL RUNOFF
TECHNIQUE: Multidetector CT imaging of the abdomen, pelvis and lower
extremities was performed using the standard protocol during bolus
administration of intravenous contrast. Multiplanar CT image
reconstructions and MIPs were obtained to evaluate the vascular
anatomy.

[Series 9: cta whole body 2.00 bv36 s3 body sag mip · sagittal · 0.80mm/px · 2 of 166 slices shown, 3 images]
[im 56/166  soft-tissue]
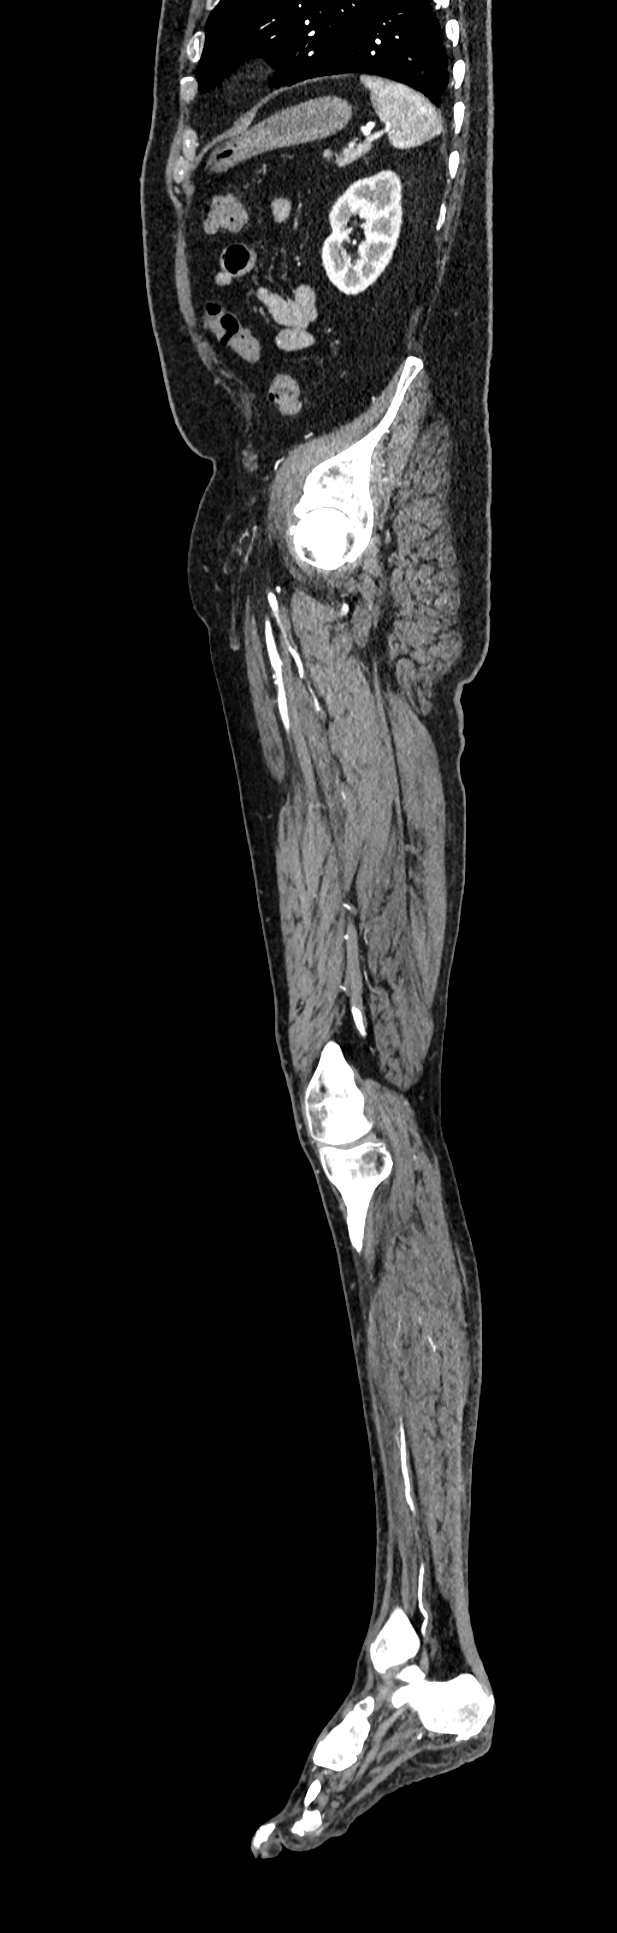
[im 56/166  bone]
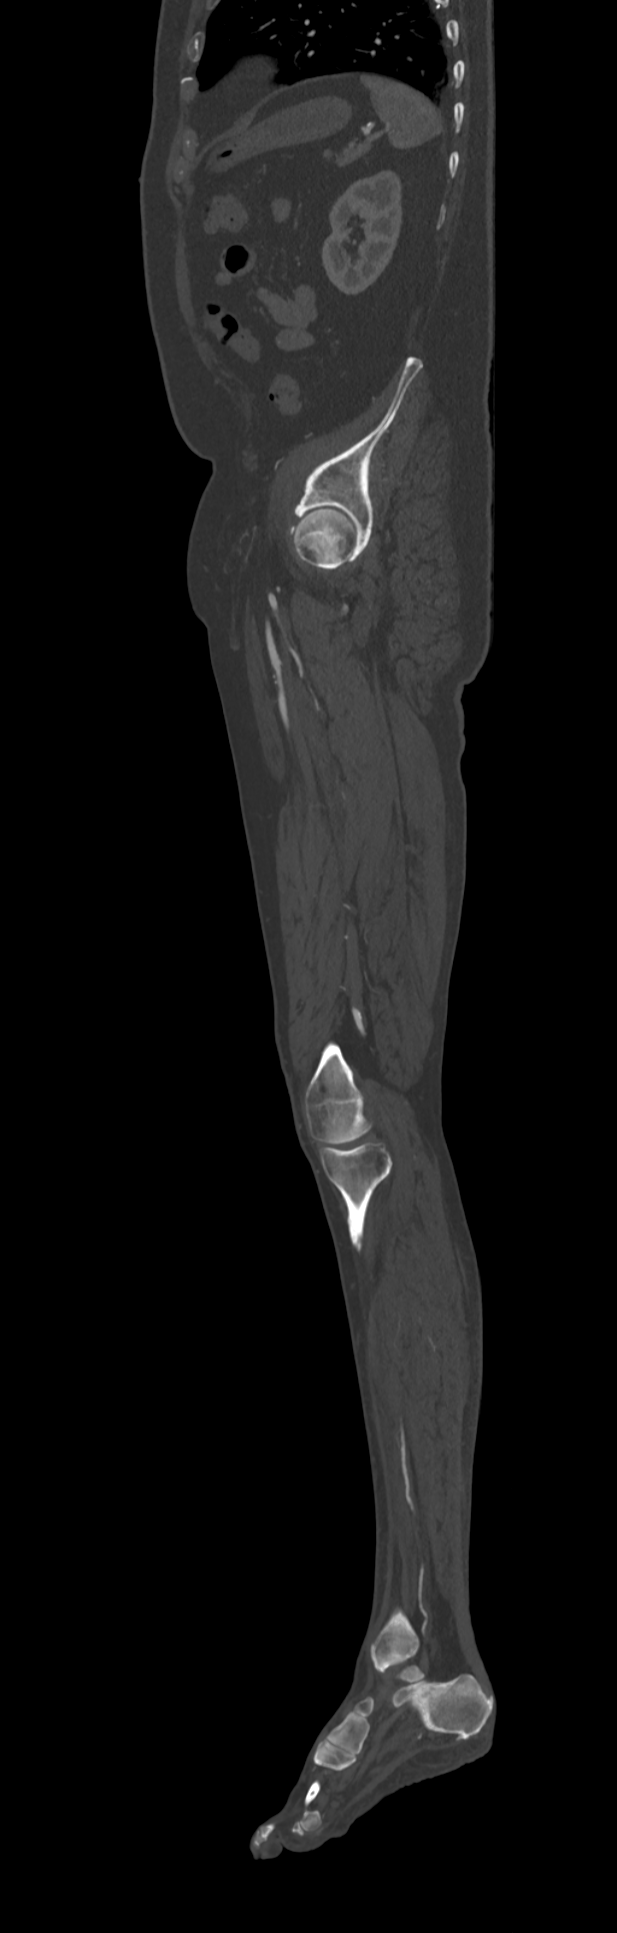
[im 111/166  soft-tissue]
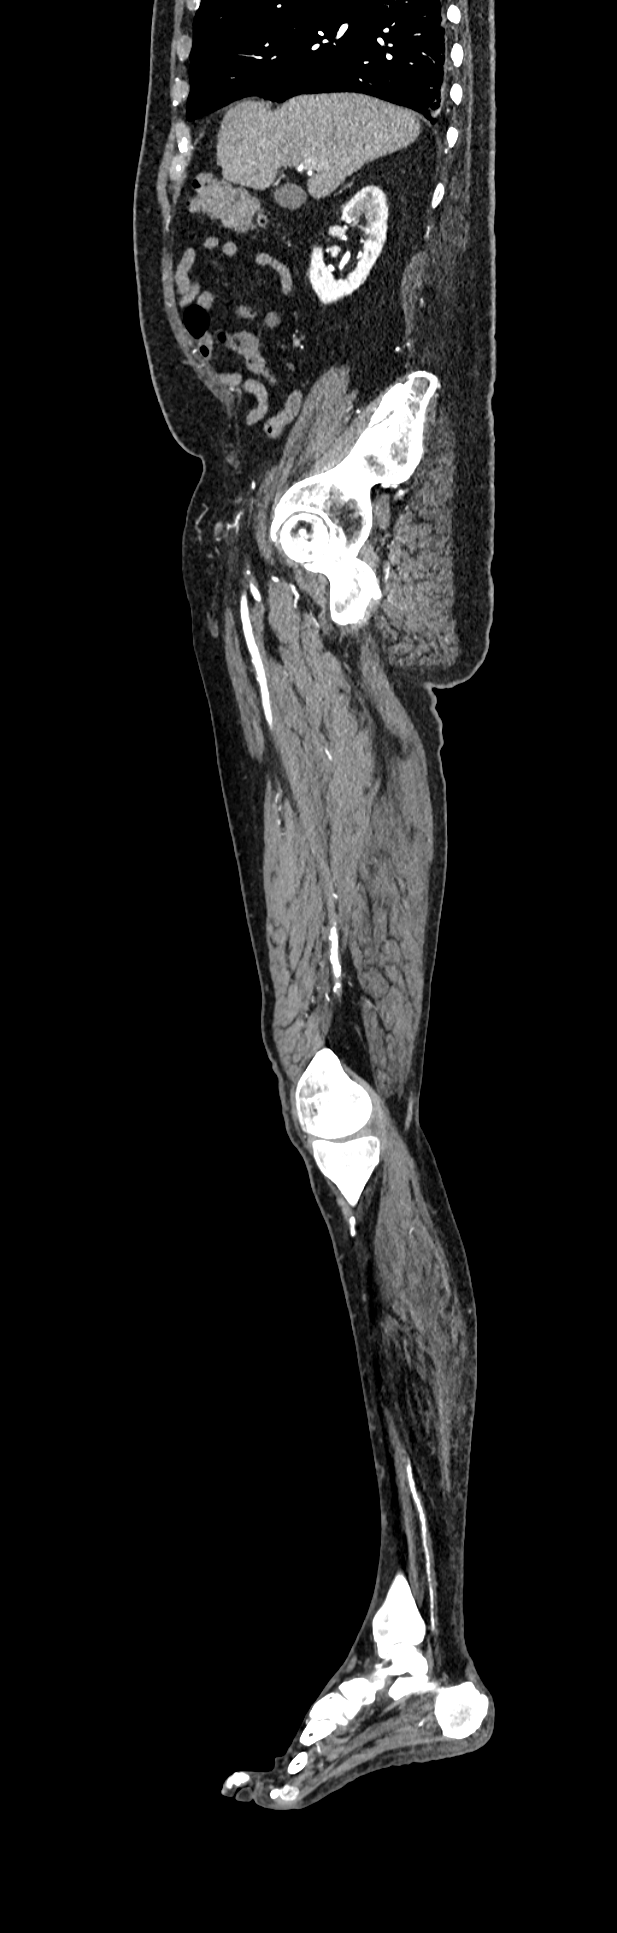

[Series 12: cta whole body 2.00 bv36 s3 cor legs mip · coronal · 0.80mm/px · 1 of 227 slices shown]
[im 46/227  soft-tissue]
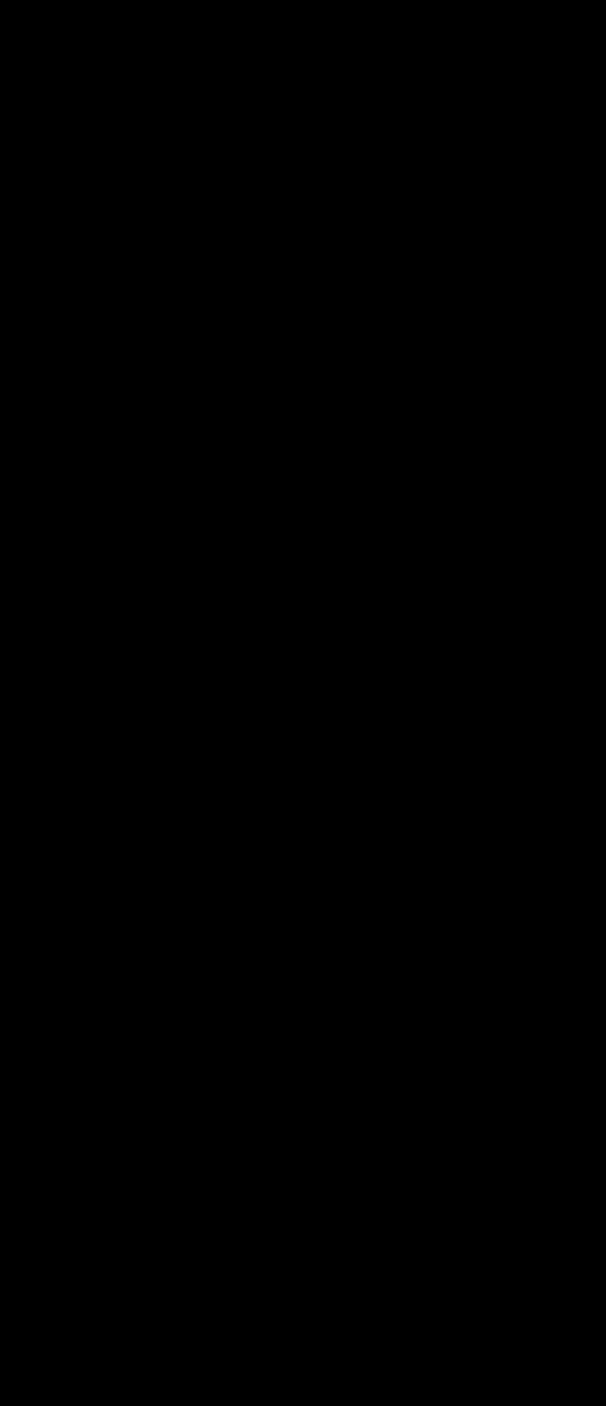

[3 of 16 positions shown; findings below may reference images not displayed]

RADIATION DOSE REDUCTION: This exam was performed according to the
departmental dose-optimization program which includes automated
exposure control, adjustment of the mA and/or kV according to
patient size and/or use of iterative reconstruction technique.

CONTRAST:  100mL 02PJZO-63Y IOPAMIDOL (02PJZO-63Y) INJECTION 76%
FINDINGS: VASCULAR

Aorta: Atherosclerotic changes of the distal thoracic aorta.
Diameter at the hiatus estimated 2.5 cm.

Moderate atherosclerosis of the infrarenal abdominal aorta with
mixed calcified and soft plaque. No ulcerated or pedunculated
plaque. No dissection.

Celiac: Mild atherosclerotic changes at the celiac artery origin.

SMA: SMA patent with mild atherosclerosis at the origin.

Renals:

- Right: Mixed calcified and soft plaque at the origin the right
renal artery with evidence of developing high-grade stenosis.

- Left: Mixed calcified and soft plaque at the origin of the left
renal artery with evidence of high-grade stenosis. Accessory left
renal artery to the lower pole segment

IMA: Inferior mesenteric artery is patent.

Right lower extremity:

Occlusion of the right common iliac artery just after the origin.
Reconstitution of the distal common iliac artery above the
bifurcation with patency maintained of the hypogastric artery and
external iliac artery. Estimated 50% narrowing of the hypogastric
origin.

No significant stenosis of the external iliac artery.

Common femoral artery patent with less than 50% narrowing secondary
to atheromatous plaque.

Profunda femoris and the thigh branches are patent.

Mild atherosclerosis of the proximal SFA, with multifocal disease
within the adductor canal and popliteal artery, with at least 2
sites of the distal SFA and the popliteal artery representing
high-grade stenosis secondary to atheromatous plaque.

Typical trifurcation.

Anterior tibial artery is proximal from the origin to the ankle.

Tibioperoneal trunk patent. Posterior tibial artery patent from the
origin to the ankle.

Peroneal artery patent from the origin to the ankle.

Mild atherosclerotic changes of the tibial arteries.

Left lower extremity:

Short segment chronic dissection or irregular plaque of the left
common iliac artery without high-grade stenosis or occlusion. No
aneurysm.

Hypogastric artery is patent with mild atherosclerosis.

External iliac artery patent without significant atherosclerosis.

Common femoral artery patent with mild atherosclerosis.

Profunda femoris and thigh branches patent.

SFA and popliteal artery patent. Sequential multifocal stenoses
secondary to atheromatous plaque within the adductor canal and
within the popliteal artery.

Typical trifurcation.

Anterior tibial artery patent from the origin to the ankle.

Tibioperoneal trunk patent with mild atherosclerotic changes.

Posterior tibial artery patent from the origin to the ankle.

Peroneal artery patent from the origin to the ankle.

Veins: Unremarkable appearance of the venous system.

Review of the MIP images confirms the above findings.

NON-VASCULAR

Lower chest: No acute finding of the lower chest.

Hepatobiliary: Unremarkable appearance of the liver. Unremarkable
gall bladder.

Pancreas: Unremarkable.

Spleen: Unremarkable.

Adrenals/Urinary Tract:

- Right adrenal gland: Unremarkable

- Left adrenal gland: Unremarkable.

- Right kidney: No hydronephrosis, nephrolithiasis, inflammation, or
ureteral dilation. No focal lesion.

- Left Kidney: No hydronephrosis, nephrolithiasis, inflammation, or
ureteral dilation. No focal lesion.

- Urinary Bladder: Urinary bladder decompressed

Stomach/Bowel:

- Stomach: Small hiatal hernia.  Otherwise unremarkable stomach.

- Small bowel: Unremarkable

- Appendix: Normal.

- Colon: Predominantly left-sided diverticular disease, with some
scattered diverticula of the transverse colon. No inflammatory
changes. No wall thickening. Mild stool burden.

Lymphatic: No adenopathy.

Mesenteric: No free fluid or air. No mesenteric adenopathy.

Reproductive: Transverse diameter of the prostate 5.6 cm

Other: Bilateral hydrocele.

Fat containing inguinal hernia bilaterally.

Musculoskeletal: Complete ankylosis of the posterior longitudinal
ligament, with ossification of inter spinous ligaments. Flowing
syndesmophytes of the anterior spine. Bilateral sacroiliac
ankylosis.

No displaced fracture.
IMPRESSION: Multilevel PA D, including:

-moderate aortic atherosclerosis with no pedunculated plaque or
ulcerated plaque. Aortic Atherosclerosis ([id number]).

-advanced right iliac disease with short segment right CIA occlusion

-mild left-sided iliac disease with no high-grade stenosis or
occlusion

-bilateral femoropopliteal disease with multifocal stenoses within
the bilateral adductor canal and popliteal arteries

-minimal tibial disease bilaterally

Renal arterial disease with bilateral stenoses at the renal artery
ostia

Ankylosing spondylitis

Additional ancillary findings as above.

## 2021-08-12 MED ORDER — IOPAMIDOL (ISOVUE-370) INJECTION 76%
100.0000 mL | Freq: Once | INTRAVENOUS | Status: AC | PRN
  Administered 2021-08-12: 100 mL via INTRAVENOUS

## 2021-08-16 ENCOUNTER — Ambulatory Visit (INDEPENDENT_AMBULATORY_CARE_PROVIDER_SITE_OTHER): Payer: PPO | Admitting: Vascular Surgery

## 2021-08-16 DIAGNOSIS — I739 Peripheral vascular disease, unspecified: Secondary | ICD-10-CM

## 2021-08-16 NOTE — Progress Notes (Signed)
I reviewed the patient's CT angiogram results with him by phone.  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  I think this could be treated with stenting.  I offered him an angiogram.  He is interested, but needs to discuss it further with his wife regarding timing.  He will call to schedule in the near future. ? ?Yevonne Aline. Stanford Breed, MD ?[facility] [place] ?Office Phone Number: [phone] ?08/16/2021 5:34 PM ? ?

## 2021-08-18 ENCOUNTER — Other Ambulatory Visit: Payer: Self-pay

## 2021-08-19 ENCOUNTER — Encounter (HOSPITAL_COMMUNITY)
Admission: RE | Disposition: A | Discharge status: Home / Self Care | Payer: Self-pay | Source: Home / Self Care | Attending: Vascular Surgery

## 2021-08-19 ENCOUNTER — Other Ambulatory Visit: Payer: Self-pay

## 2021-08-19 ENCOUNTER — Ambulatory Visit (HOSPITAL_COMMUNITY)
Admission: RE | Admit: 2021-08-19 | Discharge: 2021-08-19 | Disposition: A | Discharge status: Home / Self Care | Payer: PPO | Attending: Vascular Surgery | Admitting: Vascular Surgery

## 2021-08-19 DIAGNOSIS — I70213 Atherosclerosis of native arteries of extremities with intermittent claudication, bilateral legs: Secondary | ICD-10-CM | POA: Diagnosis not present

## 2021-08-19 DIAGNOSIS — Z7982 Long term (current) use of aspirin: Secondary | ICD-10-CM | POA: Diagnosis not present

## 2021-08-19 DIAGNOSIS — I252 Old myocardial infarction: Secondary | ICD-10-CM | POA: Insufficient documentation

## 2021-08-19 DIAGNOSIS — I2510 Atherosclerotic heart disease of native coronary artery without angina pectoris: Secondary | ICD-10-CM | POA: Diagnosis not present

## 2021-08-19 DIAGNOSIS — I70211 Atherosclerosis of native arteries of extremities with intermittent claudication, right leg: Secondary | ICD-10-CM | POA: Diagnosis not present

## 2021-08-19 DIAGNOSIS — I10 Essential (primary) hypertension: Secondary | ICD-10-CM | POA: Diagnosis not present

## 2021-08-19 DIAGNOSIS — Z79899 Other long term (current) drug therapy: Secondary | ICD-10-CM | POA: Diagnosis not present

## 2021-08-19 DIAGNOSIS — Z87891 Personal history of nicotine dependence: Secondary | ICD-10-CM | POA: Insufficient documentation

## 2021-08-19 HISTORY — PX: PERIPHERAL VASCULAR INTERVENTION: CATH118257

## 2021-08-19 HISTORY — PX: ABDOMINAL AORTOGRAM W/LOWER EXTREMITY: CATH118223

## 2021-08-19 LAB — POCT I-STAT, CHEM 8
BUN: 9 mg/dL (ref 8–23)
Calcium, Ion: 1.16 mmol/L (ref 1.15–1.40)
Chloride: 106 mmol/L (ref 98–111)
Creatinine, Ser: 0.8 mg/dL (ref 0.61–1.24)
Glucose, Bld: 122 mg/dL — ABNORMAL HIGH (ref 70–99)
HCT: 39 % (ref 39.0–52.0)
Hemoglobin: 13.3 g/dL (ref 13.0–17.0)
Potassium: 5.1 mmol/L (ref 3.5–5.1)
Sodium: 140 mmol/L (ref 135–145)
TCO2: 27 mmol/L (ref 22–32)

## 2021-08-19 SURGERY — ABDOMINAL AORTOGRAM W/LOWER EXTREMITY
Anesthesia: LOCAL | Laterality: Bilateral

## 2021-08-19 MED ORDER — CLOPIDOGREL BISULFATE 75 MG PO TABS: 75.0000 mg | ORAL_TABLET | Freq: Every day | ORAL | Status: DC

## 2021-08-19 MED ORDER — FENTANYL CITRATE (PF) 100 MCG/2ML IJ SOLN
INTRAMUSCULAR | Status: DC | PRN
  Administered 2021-08-19: 25 ug via INTRAVENOUS
  Administered 2021-08-19: 50 ug via INTRAVENOUS

## 2021-08-19 MED ORDER — ONDANSETRON HCL 4 MG/2ML IJ SOLN: 4.0000 mg | Freq: Four times a day (QID) | INTRAMUSCULAR | Status: DC | PRN

## 2021-08-19 MED ORDER — MIDAZOLAM HCL 2 MG/2ML IJ SOLN
INTRAMUSCULAR | Status: AC
  Filled 2021-08-19: qty 2

## 2021-08-19 MED ORDER — PROTAMINE SULFATE 10 MG/ML IV SOLN
INTRAVENOUS | Status: DC | PRN
  Administered 2021-08-19: 10 mg via INTRAVENOUS

## 2021-08-19 MED ORDER — SODIUM CHLORIDE 0.9% FLUSH: 3.0000 mL | INTRAVENOUS | Status: DC | PRN

## 2021-08-19 MED ORDER — CLOPIDOGREL BISULFATE 300 MG PO TABS
ORAL_TABLET | ORAL | Status: AC
  Filled 2021-08-19: qty 1

## 2021-08-19 MED ORDER — CLOPIDOGREL BISULFATE 300 MG PO TABS
ORAL_TABLET | ORAL | Status: DC | PRN
  Administered 2021-08-19: 300 mg via ORAL

## 2021-08-19 MED ORDER — SODIUM CHLORIDE 0.9 % IV SOLN: INTRAVENOUS | Status: DC

## 2021-08-19 MED ORDER — PROTAMINE SULFATE 10 MG/ML IV SOLN
INTRAVENOUS | Status: AC
  Filled 2021-08-19: qty 5

## 2021-08-19 MED ORDER — HEPARIN (PORCINE) IN NACL 1000-0.9 UT/500ML-% IV SOLN
INTRAVENOUS | Status: DC | PRN
  Administered 2021-08-19 (×2): 500 mL

## 2021-08-19 MED ORDER — MIDAZOLAM HCL 2 MG/2ML IJ SOLN
INTRAMUSCULAR | Status: DC | PRN
  Administered 2021-08-19: 1 mg via INTRAVENOUS

## 2021-08-19 MED ORDER — ACETAMINOPHEN 325 MG PO TABS: 650.0000 mg | ORAL_TABLET | ORAL | Status: DC | PRN

## 2021-08-19 MED ORDER — HEPARIN SODIUM (PORCINE) 1000 UNIT/ML IJ SOLN
INTRAMUSCULAR | Status: DC | PRN
  Administered 2021-08-19: 10000 [IU] via INTRAVENOUS
  Administered 2021-08-19: 2000 [IU] via INTRAVENOUS

## 2021-08-19 MED ORDER — FENTANYL CITRATE (PF) 100 MCG/2ML IJ SOLN
INTRAMUSCULAR | Status: AC
Start: 2021-08-19 — End: ?
  Filled 2021-08-19: qty 2

## 2021-08-19 MED ORDER — SODIUM CHLORIDE 0.9 % IV SOLN: 250.0000 mL | INTRAVENOUS | Status: DC | PRN

## 2021-08-19 MED ORDER — CLOPIDOGREL BISULFATE 75 MG PO TABS: 300.0000 mg | ORAL_TABLET | Freq: Once | ORAL | Status: DC

## 2021-08-19 MED ORDER — SODIUM CHLORIDE 0.9% FLUSH: 3.0000 mL | Freq: Two times a day (BID) | INTRAVENOUS | Status: DC

## 2021-08-19 MED ORDER — HYDRALAZINE HCL 20 MG/ML IJ SOLN: 5.0000 mg | INTRAMUSCULAR | Status: DC | PRN

## 2021-08-19 MED ORDER — LIDOCAINE HCL (PF) 1 % IJ SOLN
INTRAMUSCULAR | Status: DC | PRN
  Administered 2021-08-19: 12 mL

## 2021-08-19 MED ORDER — HEPARIN SODIUM (PORCINE) 1000 UNIT/ML IJ SOLN
INTRAMUSCULAR | Status: AC
  Filled 2021-08-19: qty 10

## 2021-08-19 MED ORDER — IODIXANOL 320 MG/ML IV SOLN
INTRAVENOUS | Status: DC | PRN
  Administered 2021-08-19: 175 mL

## 2021-08-19 MED ORDER — SODIUM CHLORIDE 0.9 % WEIGHT BASED INFUSION: 1.0000 mL/kg/h | INTRAVENOUS | Status: DC

## 2021-08-19 MED ORDER — LIDOCAINE HCL (PF) 1 % IJ SOLN
INTRAMUSCULAR | Status: AC
  Filled 2021-08-19: qty 30

## 2021-08-19 MED ORDER — LABETALOL HCL 5 MG/ML IV SOLN: 10.0000 mg | INTRAVENOUS | Status: DC | PRN

## 2021-08-19 MED ORDER — HEPARIN (PORCINE) IN NACL 1000-0.9 UT/500ML-% IV SOLN
INTRAVENOUS | Status: AC
  Filled 2021-08-19: qty 1000

## 2021-08-19 MED ORDER — CLOPIDOGREL BISULFATE 75 MG PO TABS: 75.0000 mg | ORAL_TABLET | Freq: Every day | ORAL | 0 refills | Status: DC

## 2021-08-19 SURGICAL SUPPLY — 25 items
CATH NAVICROSS ANG 65CM (CATHETERS) IMPLANT
CATH OMNI FLUSH 5F 65CM (CATHETERS) ×1 IMPLANT
CATH QUICKCROSS SUPP .035X90CM (MICROCATHETER) ×1 IMPLANT
CATH TEMPO 5F RIM 65CM (CATHETERS) ×1 IMPLANT
CATH TEMPO AQUA 5F 100CM (CATHETERS) ×1 IMPLANT
CATHETER NAVICROSS ANG 65CM (CATHETERS) ×3
CLOSURE PERCLOSE PROSTYLE (VASCULAR PRODUCTS) ×1 IMPLANT
GLIDEWIRE ADV .035X260CM (WIRE) ×1 IMPLANT
GLIDEWIRE NITREX 0.018X80X5 (WIRE) ×3
GUIDEWIRE ANGLED .035X150CM (WIRE) ×1 IMPLANT
GUIDEWIRE NITREX 0.018X80X5 (WIRE) IMPLANT
KIT ENCORE 26 ADVANTAGE (KITS) ×1 IMPLANT
KIT MICROPUNCTURE NIT STIFF (SHEATH) ×1 IMPLANT
KIT PV (KITS) ×3 IMPLANT
SHEATH GUIDING 7F 55X73X9MM TD (SHEATH) ×1 IMPLANT
SHEATH PINNACLE 5F 10CM (SHEATH) ×1 IMPLANT
SHEATH PINNACLE 7F 10CM (SHEATH) ×1 IMPLANT
SHEATH PROBE COVER 6X72 (BAG) ×1 IMPLANT
STENT VIABAHN 8X29X80 VBX (Permanent Stent) ×2 IMPLANT
STENT VIABAHN VBX 7X19X80 (Permanent Stent) ×1 IMPLANT
SYR MEDRAD MARK V 150ML (SYRINGE) ×1 IMPLANT
TRANSDUCER W/STOPCOCK (MISCELLANEOUS) ×3 IMPLANT
TRAY PV CATH (CUSTOM PROCEDURE TRAY) ×3 IMPLANT
WIRE BENTSON .035X145CM (WIRE) ×1 IMPLANT
WIRE G V18X300CM (WIRE) ×1 IMPLANT

## 2021-08-19 NOTE — Interval H&P Note (Signed)
History and Physical Interval Note: ? ?08/19/2021 ?11:32 AM ? ?Timothy Arias  has presented today for surgery, with the diagnosis of pvd with claudication.  The various methods of treatment have been discussed with the patient and family. After consideration of risks, benefits and other options for treatment, the patient has consented to  Procedure(s): ?ABDOMINAL AORTOGRAM W/LOWER EXTREMITY (N/A) as a surgical intervention.  The patient's history has been reviewed, patient examined, no change in status, stable for surgery.  I have reviewed the patient's chart and labs.  Questions were answered to the patient's satisfaction.   ? ? ?Cherre Robins ? ? ?

## 2021-08-19 NOTE — Progress Notes (Signed)
Instructed patient multiple times that he could not get up to ambulate until 1830 per MD orders. Went into to check on patient at 1810 and patient was standing at the bedside with his pants on, vital signs machine unhooked, and IV removed. Instructed patient that he must wait until 1830. No bleeding or hematoma noted.  ?

## 2021-08-19 NOTE — Op Note (Signed)
DATE OF SERVICE: 08/19/2021 ? ?PATIENT:  Timothy Arias  86 y.o. male ? ?PRE-OPERATIVE DIAGNOSIS:  aortoiliac occlusive disease causing claudication ? ?POST-OPERATIVE DIAGNOSIS:  Same ? ?PROCEDURE:   ?1) US guided left common femoral access ?2) Aortogram ?3) Bilateral lower extremity angiogram (185m) ?4) Right common and external iliac artery stenting ([id number] x2 + [id number]) ?5) Conscious sedation (92 minutes) ? ?SURGEON:  ThoYevonne AlineawStanford BreedD ? ?ASSISTANT: none ? ?ANESTHESIA:   local and IV sedation ? ?ESTIMATED BLOOD LOSS: minimal ? ?LOCAL MEDICATIONS USED:  LIDOCAINE  ? ?COUNTS: confirmed correct. ? ?PATIENT DISPOSITION:  PACU - hemodynamically stable. ?  ?Delay start of Pharmacological VTE agent (>24hrs) due to surgical blood loss or risk of bleeding: no ? ?INDICATION FOR PROCEDURE: Timothy Arias a 85 25o. male with aortoiliac occlusive disease causing disabling claudication. After careful discussion of risks, benefits, and alternatives the patient was offered angiogram with iliac stenting. The patient understood and wished to proceed. ? ?OPERATIVE FINDINGS:  ?Terminal aorta and iliac arteries: ?Diminutive terminal aorta ?Right common iliac artery occlusion redemonstrated ? ?Right lower extremity: ?Common femoral artery: widely patent  ?Profunda femoris artery: widely patent   ?Superficial femoral artery: widely patent  ?Popliteal artery: widely patent  ?Anterior tibial artery: widely patent  ?Tibioperoneal trunk: widely patent  ?Peroneal artery: widely patent  ?Posterior tibial artery: widely patent  ?Pedal circulation: not well seen ? ?Left lower extremity: ?Common femoral artery: widely patent   ?Profunda femoris artery: widely patent   ?Superficial femoral artery: severe stenosis throughout into above knee popliteal artery (greatest stenosis 80%) ?Popliteal artery: widely patent  ?Anterior tibial artery: widely patent  ?Tibioperoneal trunk: widely patent  ?Peroneal artery: widely patent  ?Posterior  tibial artery: widely patent  ?Pedal circulation: not well seen ? ?DESCRIPTION OF PROCEDURE: After identification of the patient in the pre-operative holding area, the patient was transferred to the operating room. The patient was positioned supine on the operating room table. Anesthesia was induced. The groins was prepped and draped in standard fashion. A surgical pause was performed confirming correct patient, procedure, and operative location. ? ?I attempted to access the right common femoral artery, but was unable to establish micropuncture access. ? ?The left groin was anesthetized with subcutaneous injection of 1% lidocaine. Using ultrasound guidance, the left common femoral artery was accessed with micropuncture technique. Fluoroscopy was used to confirm cannulation over the femoral head. The 14F sheath was upsized to 14F.  ? ?A Benson wire was advanced into the distal aorta. Over the wire an omni flush catheter was advanced to the level of L2. Aortogram was performed - see above for details.  ? ?A glidewire advantage was used to engage the stump of the right common iliac artery.  A 7 French Oscor sheath was then delivered into the terminal aorta and shaped to engage the right common iliac artery stump.  I was able to cross the lesion with a combination of Glidewire advantage and a quick cross catheter.  The quick cross catheter deformed.  And so had to recross the lesion with a wire alone.  Follow-up angiography showed successful crossing of the lesion in the true intimal position. ? ?The patient was systemically heparinized with 10,000 units of IV heparin. ? ?The sheath was then delivered across the lesion.  An 8 x 29 mm VBX stent was placed across the lesion.  The stent was deployed in standard fashion.  Follow-up angiogram showed extruded thrombus at the proximal and distal margin  of the stent.  I extended the stenting distally with a 7 x 19 mm VBX partially into the external iliac artery.  Follow-up  angiography showed good resolution of the distal thrombus.  I extended the stenting proximally with an 8 x 29 mm VBX stent.  Follow-up angiography showed good resolution of the distal thrombus. ? ?Concern I had potentially distally embolized the thrombus, I then performed runoff angiography of bilateral lower extremities.  This showed no evidence of thrombus in the named vessels of the lower extremities.  Good flow was noted to the feet bilaterally.  This was confirmed by Doppler exam. ? ?A Perclose device was used to close the arteriotomy. Hemostasis was excellent upon completion. ? ?Conscious sedation was administered with the use of IV fentanyl and midazolam under continuous physician and nurse monitoring.  Heart rate, blood pressure, and oxygen saturation were continuously monitored.  Total sedation time was 92 minutes ? ?Upon completion of the case instrument and sharps counts were confirmed correct. The patient was transferred to the  PACU in good condition. I was present for all portions of the procedure. ? ?PLAN: Aspirin 81 mg by mouth daily.  Plavix 75 mg by mouth daily x1 month.  High intensity statin therapy.  Follow-up with me in 1 month with ABI. ? ?Yevonne Aline. Stanford Breed, MD ?[facility] [place] ?Office Phone Number: [phone] ?08/19/2021 1:10 PM ? ?

## 2021-08-19 NOTE — Progress Notes (Signed)
Stanford Breed, MD come to see patient and stated that patient could ambulate at 1830 and if it is ok then he could be discharged.  ?

## 2021-08-19 NOTE — Progress Notes (Signed)
Patients bedrest was up at 1515. Patient ambulated to the bathroom and back with minimal bleeding and a small hematoma. Pressure held for 15 minutes and bleeding along with hematoma resolved. Stanford Breed, MD notified and stated that he would like to extend bedrest until 2100 tonight. Patient and family notified.  ?

## 2021-08-22 ENCOUNTER — Encounter (HOSPITAL_COMMUNITY): Payer: Self-pay | Admitting: Vascular Surgery

## 2021-08-24 ENCOUNTER — Other Ambulatory Visit: Payer: Self-pay | Admitting: *Deleted

## 2021-08-24 MED ORDER — CLOPIDOGREL BISULFATE 75 MG PO TABS: 75.0000 mg | ORAL_TABLET | Freq: Every day | ORAL | 0 refills | Status: DC

## 2021-09-15 ENCOUNTER — Other Ambulatory Visit: Payer: Self-pay | Admitting: *Deleted

## 2021-09-15 DIAGNOSIS — I70213 Atherosclerosis of native arteries of extremities with intermittent claudication, bilateral legs: Secondary | ICD-10-CM

## 2021-09-15 DIAGNOSIS — I739 Peripheral vascular disease, unspecified: Secondary | ICD-10-CM

## 2021-09-26 NOTE — Progress Notes (Signed)
Office Note     CC:  follow up Requesting Provider:  Lavone Orn, MD  HPI: Timothy Arias is a 86 y.o. ([birth year]) male who presents for follow up after Angiogram. On 08/19/21 he underwent Aortogram, bilateral lower extremity arteriogram with Right common and external iliac artery stenting (8x10m VBX x2 + 7x137mVBX) by Dr. HaStanford BreedThis was performed secondary to lifestyle limiting claudication. Prior to intervention he was not having any rest pain or tissue loss. He was unable to walk to his mailbox and back without having to rest. This pain was reproducible on continued ambulation and relieved with rest. He was becoming very restricted in his ADL's secondary to his symptoms.  He now reports that he can walk **** He has been compliant with is Aspirin and Statin. He took Plavix for 1 month  The pt is on a statin for cholesterol management.  The pt is on a daily aspirin.   Other AC:  none The pt is on BB, CCB for hypertension.   The pt is not diabetic.   Tobacco hx:  Former, 1992  Past Medical History:  Diagnosis Date   BPH (benign prostatic hyperplasia)    Colon polyps    Coronary artery disease    Dyslipidemia    ED (erectile dysfunction)    Hydrocele    Hyperlipidemia    Hypertension    [place]   Nocturnal enuresis     Past Surgical History:  Procedure Laterality Date   ABDOMINAL AORTOGRAM W/LOWER EXTREMITY Bilateral 08/19/2021   Procedure: ABDOMINAL AORTOGRAM W/LOWER EXTREMITY;  Surgeon: HaCherre RobinsMD;  [facility];  Service: Cardiovascular;  Laterality: Bilateral;   COLONOSCOPY W/ POLYPECTOMY     CORONARY ANGIOPLASTY     with stents   KNEE ARTHROSCOPY     PERIPHERAL VASCULAR INTERVENTION  08/19/2021   Procedure: PERIPHERAL VASCULAR INTERVENTION;  Surgeon: HaCherre RobinsMD;  [facility];  Service: Cardiovascular;;  Right Iliac   RECTAL VILLOUS ADENOMA EXCISION     VASECTOMY      Social  History   Socioeconomic History   Marital status: Married    Spouse name: Not on file   Number of children: Not on file   Years of education: Not on file   Highest education level: Not on file  Occupational History   Not on file  Tobacco Use   Smoking status: Former    Packs/day: 1.00    Years: 60.00    Pack years: 60.00    Types: Cigarettes    Quit date: 08/17/1990    Years since quitting: 31.1   Smokeless tobacco: Current    Types: Snuff   Tobacco comments:    quit 1992  Substance and Sexual Activity   Alcohol use: Yes    Comment: occasionally   Drug use: No   Sexual activity: Not on file  Other Topics Concern   Not on file  Social History Narrative   Not on file   Social Determinants of Health   Financial Resource Strain: Not on file  Food Insecurity: Not on file  Transportation Needs: Not on file  Physical Activity: Not on file  Stress: Not on file  Social Connections: Not on file  Intimate Partner Violence: Not on file   No family history on file.  Current Outpatient Medications  Medication Sig Dispense Refill   amLODipine (NORVASC) 5 MG tablet Take 5 mg by mouth at bedtime.  aspirin 325 MG tablet Take 325 mg by mouth at bedtime.     atorvastatin (LIPITOR) 40 MG tablet Take 40 mg by mouth at bedtime.     Cholecalciferol (VITAMIN D3) 50 MCG (2000 UT) capsule Take 2,000 Units by mouth at bedtime.     citalopram (CELEXA) 20 MG tablet Take 20 mg by mouth at bedtime.     fluocinonide (LIDEX) 0.05 % external solution Apply 1 application. topically daily as needed (eczema).     fluticasone (FLONASE) 50 MCG/ACT nasal spray Place 1 spray into both nostrils daily as needed for congestion.     galantamine (RAZADYNE ER) 8 MG 24 hr capsule Take 16 mg by mouth at bedtime.     metoprolol succinate (TOPROL-XL) 50 MG 24 hr tablet Take 50 mg by mouth at bedtime.     Multiple Vitamin (MULTIVITAMIN WITH MINERALS) TABS Take 1 tablet by mouth at bedtime.     No current  facility-administered medications for this visit.    No Known Allergies   REVIEW OF SYSTEMS:  '[X]'$  denotes positive finding, '[ ]'$  denotes negative finding Cardiac  Comments:  Chest pain or chest pressure:    Shortness of breath upon exertion:    Short of breath when lying flat:    Irregular heart rhythm:        Vascular    Pain in calf, thigh, or hip brought on by ambulation:    Pain in feet at night that wakes you up from your sleep:     Blood clot in your veins:    Leg swelling:         Pulmonary    Oxygen at home:    Productive cough:     Wheezing:         Neurologic    Sudden weakness in arms or legs:     Sudden numbness in arms or legs:     Sudden onset of difficulty speaking or slurred speech:    Temporary loss of vision in one eye:     Problems with dizziness:         Gastrointestinal    Blood in stool:     Vomited blood:         Genitourinary    Burning when urinating:     Blood in urine:        Psychiatric    Major depression:         Hematologic    Bleeding problems:    Problems with blood clotting too easily:        Skin    Rashes or ulcers:        Constitutional    Fever or chills:      PHYSICAL EXAMINATION:  There were no vitals filed for this visit.  General:  WDWN in NAD; vital signs documented above Gait: Not observed HENT: WNL, normocephalic Pulmonary: normal non-labored breathing , without Rales, rhonchi,  wheezing Cardiac: {Desc; regular/irreg:14544} HR, without  Murmurs {With/Without:20273} carotid bruit*** Abdomen: soft, NT, no masses Skin: {With/Without:20273} rashes Vascular Exam/Pulses:  Right Left  Radial {Exam; arterial pulse strength 0-4:30167} {Exam; arterial pulse strength 0-4:30167}  Ulnar {Exam; arterial pulse strength 0-4:30167} {Exam; arterial pulse strength 0-4:30167}  Femoral {Exam; arterial pulse strength 0-4:30167} {Exam; arterial pulse strength 0-4:30167}  Popliteal {Exam; arterial pulse strength 0-4:30167}  {Exam; arterial pulse strength 0-4:30167}  DP {Exam; arterial pulse strength 0-4:30167} {Exam; arterial pulse strength 0-4:30167}  PT {Exam; arterial pulse strength 0-4:30167} {Exam; arterial pulse strength 0-4:30167}  Extremities: {With/Without:20273} ischemic changes, {With/Without:20273} Gangrene , {With/Without:20273} cellulitis; {With/Without:20273} open wounds;  Musculoskeletal: no muscle wasting or atrophy  Neurologic: A&O X 3;  No focal weakness or paresthesias are detected Psychiatric:  The pt has {Desc; normal/abnormal:11317::"Normal"} affect.   Non-Invasive Vascular Imaging:   ***    ASSESSMENT/PLAN:: 86 y.o. male here for follow up after Angiogram. On 08/19/21 he underwent Aortogram, bilateral lower extremity arteriogram with Right common and external iliac artery stenting (8x67m VBX x2 + 7x149mVBX) by Dr. HaStanford BreedThis was performed secondary to lifestyle limiting claudication   -***   CoKaroline CaldwellPA-C [facility] ClinicD:  ClEarnie Larsson

## 2021-09-27 ENCOUNTER — Ambulatory Visit (HOSPITAL_COMMUNITY)
Admission: RE | Admit: 2021-09-27 | Discharge: 2021-09-27 | Disposition: A | Discharge status: Home / Self Care | Payer: PPO | Source: Ambulatory Visit | Attending: Vascular Surgery | Admitting: Vascular Surgery

## 2021-09-27 ENCOUNTER — Ambulatory Visit (INDEPENDENT_AMBULATORY_CARE_PROVIDER_SITE_OTHER): Payer: PPO | Admitting: Physician Assistant

## 2021-09-27 VITALS — BP 134/74 | HR 69 | Temp 98.2°F | Resp 18 | Ht 72.0 in | Wt 226.8 lb

## 2021-09-27 DIAGNOSIS — I739 Peripheral vascular disease, unspecified: Secondary | ICD-10-CM

## 2021-09-27 DIAGNOSIS — I70213 Atherosclerosis of native arteries of extremities with intermittent claudication, bilateral legs: Secondary | ICD-10-CM

## 2021-09-30 ENCOUNTER — Other Ambulatory Visit: Payer: Self-pay | Admitting: Vascular Surgery

## 2021-10-03 ENCOUNTER — Other Ambulatory Visit: Payer: Self-pay | Admitting: *Deleted

## 2021-10-03 DIAGNOSIS — I739 Peripheral vascular disease, unspecified: Secondary | ICD-10-CM

## 2021-10-03 DIAGNOSIS — I70213 Atherosclerosis of native arteries of extremities with intermittent claudication, bilateral legs: Secondary | ICD-10-CM

## 2021-10-26 DIAGNOSIS — H2513 Age-related nuclear cataract, bilateral: Secondary | ICD-10-CM | POA: Diagnosis not present

## 2021-10-26 DIAGNOSIS — H43813 Vitreous degeneration, bilateral: Secondary | ICD-10-CM | POA: Diagnosis not present

## 2021-10-26 DIAGNOSIS — D3132 Benign neoplasm of left choroid: Secondary | ICD-10-CM | POA: Diagnosis not present

## 2021-10-26 DIAGNOSIS — D3131 Benign neoplasm of right choroid: Secondary | ICD-10-CM | POA: Diagnosis not present

## 2021-11-21 ENCOUNTER — Ambulatory Visit (INDEPENDENT_AMBULATORY_CARE_PROVIDER_SITE_OTHER): Payer: PPO | Admitting: Neurology

## 2021-11-21 ENCOUNTER — Encounter: Payer: Self-pay | Admitting: Neurology

## 2021-11-21 VITALS — BP 120/65 | HR 69 | Ht 71.0 in | Wt 227.0 lb

## 2021-11-21 DIAGNOSIS — G301 Alzheimer's disease with late onset: Secondary | ICD-10-CM

## 2021-11-21 DIAGNOSIS — F02A0 Dementia in other diseases classified elsewhere, mild, without behavioral disturbance, psychotic disturbance, mood disturbance, and anxiety: Secondary | ICD-10-CM | POA: Diagnosis not present

## 2021-11-21 MED ORDER — MEMANTINE HCL 10 MG PO TABS: 10.0000 mg | ORAL_TABLET | Freq: Two times a day (BID) | ORAL | 6 refills | Status: DC

## 2021-11-21 NOTE — Progress Notes (Unsigned)
[facility]  PATIENT: Timothy Arias DOB: November 10, 1935  REQUESTING CLINICIAN: Lavone Orn, MD HISTORY FROM: Patient and spouse  REASON FOR VISIT: Worsening memory for the past year    HISTORICAL  CHIEF COMPLAINT:  Chief Complaint  Patient presents with   New Patient (Initial Visit)    Rm 12. Accompanied by wife. NP/[facility] MD/dementia eval.    HISTORY OF PRESENT ILLNESS:  This is a 86 year old man with past medical history of dementia, on galantamine, hypertension hyperlipidemia presenting with worsening memory.  Wife reported the memory has been worse in the past year, he has difficulty with short-term memory and patient reported remembering date is very hard for me for him.  He still drive and sometimes will forget his way to familiar places.  1 thing that he noted that he is sleeps a lot.  He still independent somewhat, still pays bills on time.    TBI:   No past history of TBI Stroke:   no past history of stroke Seizures:   no past history of seizures Sleep:   no history of sleep apnea.   Mood:   patient denies anxiety and depression  Functional status: independent in allf ADLs and IADLs Patient lives with spouse  Cooking: no issues  Cleaning: no issues  Shopping: no issues  Bathing: no help needed  Toileting: No help needed  Driving: Still drives but forget how to get to familiar places Bills: Denies   Ever left the stove on by accident?: no  Forget how to use items around the house?: no Getting lost going to familiar places?: yes  Forgetting loved ones names?: no  Word finding difficulty? Yes Sleep: Sleeps a lot    OTHER MEDICAL CONDITIONS: Hypertension, Hyperlipidemia,    REVIEW OF SYSTEMS: Full 14 system review of systems performed and negative with exception of: as noted in the HPI   ALLERGIES: No Known Allergies  HOME MEDICATIONS: Outpatient Medications Prior to Visit  Medication Sig  Dispense Refill   amLODipine (NORVASC) 5 MG tablet Take 5 mg by mouth at bedtime.     aspirin 325 MG tablet Take 325 mg by mouth at bedtime.     atorvastatin (LIPITOR) 40 MG tablet Take 40 mg by mouth at bedtime.     Cholecalciferol (VITAMIN D3) 50 MCG (2000 UT) capsule Take 2,000 Units by mouth at bedtime.     citalopram (CELEXA) 20 MG tablet Take 20 mg by mouth at bedtime.     clopidogrel (PLAVIX) 75 MG tablet TAKE 1 TABLET(75 MG) BY MOUTH DAILY 30 tablet 0   fluocinonide (LIDEX) 0.05 % external solution Apply 1 application. topically daily as needed (eczema).     fluticasone (FLONASE) 50 MCG/ACT nasal spray Place 1 spray into both nostrils daily as needed for congestion.     galantamine (RAZADYNE ER) 8 MG 24 hr capsule Take 16 mg by mouth at bedtime.     metoprolol succinate (TOPROL-XL) 50 MG 24 hr tablet Take 50 mg by mouth at bedtime.     Multiple Vitamin (MULTIVITAMIN WITH MINERALS) TABS Take 1 tablet by mouth at bedtime.     No facility-administered medications prior to visit.    PAST MEDICAL HISTORY: Past Medical History:  Diagnosis Date   BPH (benign prostatic hyperplasia)    Colon polyps    Coronary artery disease    Dyslipidemia    ED (erectile dysfunction)    Hydrocele    Hyperlipidemia    Hypertension  MI (myocardial infarction) ([place] 1992   Nocturnal enuresis     PAST SURGICAL HISTORY: Past Surgical History:  Procedure Laterality Date   ABDOMINAL AORTOGRAM W/LOWER EXTREMITY Bilateral 08/19/2021   Procedure: ABDOMINAL AORTOGRAM W/LOWER EXTREMITY;  Surgeon: Cherre Robins, MD;  Location: [facility];  Service: Cardiovascular;  Laterality: Bilateral;   COLONOSCOPY W/ POLYPECTOMY     CORONARY ANGIOPLASTY     with stents   KNEE ARTHROSCOPY     PERIPHERAL VASCULAR INTERVENTION  08/19/2021   Procedure: PERIPHERAL VASCULAR INTERVENTION;  Surgeon: Cherre Robins, MD;  Location: [facility];  Service: Cardiovascular;;  Right Iliac   RECTAL VILLOUS  ADENOMA EXCISION     VASECTOMY      FAMILY HISTORY: History reviewed. No pertinent family history.  SOCIAL HISTORY: Social History   Socioeconomic History   Marital status: Married    Spouse name: Not on file   Number of children: Not on file   Years of education: Not on file   Highest education level: Not on file  Occupational History   Not on file  Tobacco Use   Smoking status: Former    Packs/day: 1.00    Years: 60.00    Total pack years: 60.00    Types: Cigarettes    Quit date: 08/17/1990    Years since quitting: 31.2   Smokeless tobacco: Current    Types: Snuff   Tobacco comments:    quit 1992  Substance and Sexual Activity   Alcohol use: Yes    Comment: occasionally   Drug use: No   Sexual activity: Not on file  Other Topics Concern   Not on file  Social History Narrative   Not on file   Social Determinants of Health   Financial Resource Strain: Not on file  Food Insecurity: Not on file  Transportation Needs: Not on file  Physical Activity: Not on file  Stress: Not on file  Social Connections: Not on file  Intimate Partner Violence: Not on file    PHYSICAL EXAM  GENERAL EXAM/CONSTITUTIONAL: Vitals:  Vitals:   11/21/21 1416  BP: 120/65  Pulse: 69  Weight: 227 lb (103 kg)  Height: '5\' 11"'$  (1.803 m)   Body mass index is 31.66 kg/m. Wt Readings from Last 3 Encounters:  11/21/21 227 lb (103 kg)  09/27/21 226 lb 12.8 oz (102.9 kg)  08/19/21 230 lb (104.3 kg)   Patient is in no distress; well developed, nourished and groomed; neck is supple   EYES: Pupils round and reactive to light, Visual fields full to confrontation, Extraocular movements intacts,   MUSCULOSKELETAL: Gait, strength, tone, movements noted in Neurologic exam below  NEUROLOGIC: MENTAL STATUS:      No data to display            11/21/2021    2:19 PM  Montreal Cognitive Assessment   Visuospatial/ Executive (0/5) 4  Naming (0/3) 2  Attention: Read list of digits  (0/2) 2  Attention: Read list of letters (0/1) 1  Attention: Serial 7 subtraction starting at 100 (0/3) 3  Language: Repeat phrase (0/2) 2  Language : Fluency (0/1) 0  Abstraction (0/2) 2  Delayed Recall (0/5) 0  Orientation (0/6) 2  Total 18  Adjusted Score (based on education) 19     CRANIAL NERVE:  2nd, 3rd, 4th, 6th - pupils equal and reactive to light, visual fields full to confrontation, extraocular muscles intact, no nystagmus 5th - facial sensation symmetric 7th - facial strength  symmetric 8th - hearing intact 9th - palate elevates symmetrically, uvula midline 11th - shoulder shrug symmetric 12th - tongue protrusion midline  MOTOR:  normal bulk and tone, full strength in the BUE, BLE  SENSORY:  normal and symmetric to light touch, vibration  COORDINATION:  finger-nose-finger, fine finger movements normal  REFLEXES:  deep tendon reflexes present and symmetric  GAIT/STATION:  normal   DIAGNOSTIC DATA (LABS, IMAGING, TESTING) - I reviewed patient records, labs, notes, testing and imaging myself where available.  Lab Results  Component Value Date   WBC 6.4 06/09/2019   HGB 13.3 08/19/2021   HCT 39.0 08/19/2021   MCV 93.0 06/09/2019   PLT 181 06/09/2019      Component Value Date/Time   NA 140 08/19/2021 0929   K 5.1 08/19/2021 0929   CL 106 08/19/2021 0929   CO2 28 06/09/2019 1705   GLUCOSE 122 (H) 08/19/2021 0929   BUN 9 08/19/2021 0929   CREATININE 0.80 08/19/2021 0929   CALCIUM 10.0 06/09/2019 1705   PROT 8.0 01/12/2012 2345   ALBUMIN 4.4 01/12/2012 2345   AST 29 01/12/2012 2345   ALT 44 01/12/2012 2345   ALKPHOS 108 01/12/2012 2345   BILITOT 0.9 01/12/2012 2345   GFRNONAA >60 06/09/2019 1705   GFRAA >60 06/09/2019 1705   No results found for: "CHOL", "HDL", "LDLCALC", "LDLDIRECT", "TRIG", "CHOLHDL" No results found for: "HGBA1C" Lab Results  Component Value Date   VITAMINB12 459 11/21/2021   Lab Results  Component Value Date   TSH  4.490 11/21/2021      ASSESSMENT AND PLAN  86 y.o. year old male with history of dementia, hypertension hyperlipidemia who is presenting with worsening memory for the past year.  Patient and wife report trouble with short-term memory, and sometimes getting lost while going to familiar places.  On exam, he scored a 19/30 on Moca indicative of impairment. Other than that he is stable to still function somewhat independently, able to pay his bills.  He is already on, galantamine 16 mg daily, I will go ahead and add Namenda 10 mg twice daily.  We will also obtain a TSH, his latest one was abnormal and B12 level.  We will follow-up in 6 months at that time we will repeat Moca.   1. Mild late onset Alzheimer's dementia without behavioral disturbance, psychotic disturbance, mood disturbance, or anxiety (HCC)      Patient Instructions  Continue current medications Start with Namenda 10 mg twice daily TSH and Vitamin B 12 level today   Continue to follow up with PCP   Return in 6 months or sooner if worse     There are well-accepted and sensible ways to reduce risk for Alzheimers disease and other degenerative brain disorders .  Exercise Daily Walk A daily 20 minute walk should be part of your routine. Disease related apathy can be a significant roadblock to exercise and the only way to overcome this is to make it a daily routine and perhaps have a reward at the end (something your loved one loves to eat or drink perhaps) or a personal trainer coming to the home can also be very useful. Most importantly, the patient is much more likely to exercise if the caregiver / spouse does it with him/her. In general a structured, repetitive schedule is best.  General Health: Any diseases which effect your body will effect your brain such as a pneumonia, urinary infection, blood clot, heart attack or stroke. Keep contact with your primary  care doctor for regular follow ups.  Sleep. A good nights sleep is  healthy for the brain. Seven hours is recommended. If you have insomnia or poor sleep habits we can give you some instructions. If you have sleep apnea wear your mask.  Diet: Eating a heart healthy diet is also a good idea; fish and poultry instead of red meat, nuts (mostly non-peanuts), vegetables, fruits, olive oil or canola oil (instead of butter), minimal salt (use other spices to flavor foods), whole grain rice, bread, cereal and pasta and wine in moderation.Research is now showing that the MIND diet, which is a combination of The Mediterranean diet and the DASH diet, is beneficial for cognitive processing and longevity. Information about this diet can be found in The MIND Diet, a book by Doyne Keel, MS, RDN, and online at NotebookDistributors.si  Finances, Power of Attorney and Advance Directives: You should consider putting legal safeguards in place with regard to financial and medical decision making. While the spouse always has power of attorney for medical and financial issues in the absence of any form, you should consider what you want in case the spouse / caregiver is no longer around or capable of making decisions.   Orders Placed This Encounter  Procedures   TSH   Vitamin B12    Meds ordered this encounter  Medications   memantine (NAMENDA) 10 MG tablet    Sig: Take 1 tablet (10 mg total) by mouth 2 (two) times daily.    Dispense:  60 tablet    Refill:  6    Return in about 6 months (around 05/24/2022).  I have spent a total of 50 minutes dedicated to this patient today, preparing to see patient, performing a medically appropriate examination and evaluation, ordering tests and/or medications and procedures, and counseling and educating the patient/family/caregiver; independently interpreting result and communicating results to the family/patient/caregiver; and documenting clinical information in the electronic medical record.   Alric Ran, MD  11/23/2021, 4:40 PM  [facility] [address], [place], [place] [address] [phone]

## 2021-11-21 NOTE — Patient Instructions (Addendum)
Continue current medications Start with Namenda 10 mg twice daily TSH and Vitamin B 12 level today   Continue to follow up with PCP   Return in 6 months or sooner if worse     There are well-accepted and sensible ways to reduce risk for Alzheimers disease and other degenerative brain disorders .  Exercise Daily Walk A daily 20 minute walk should be part of your routine. Disease related apathy can be a significant roadblock to exercise and the only way to overcome this is to make it a daily routine and perhaps have a reward at the end (something your loved one loves to eat or drink perhaps) or a personal trainer coming to the home can also be very useful. Most importantly, the patient is much more likely to exercise if the caregiver / spouse does it with him/her. In general a structured, repetitive schedule is best.  General Health: Any diseases which effect your body will effect your brain such as a pneumonia, urinary infection, blood clot, heart attack or stroke. Keep contact with your primary care doctor for regular follow ups.  Sleep. A good nights sleep is healthy for the brain. Seven hours is recommended. If you have insomnia or poor sleep habits we can give you some instructions. If you have sleep apnea wear your mask.  Diet: Eating a heart healthy diet is also a good idea; fish and poultry instead of red meat, nuts (mostly non-peanuts), vegetables, fruits, olive oil or canola oil (instead of butter), minimal salt (use other spices to flavor foods), whole grain rice, bread, cereal and pasta and wine in moderation.Research is now showing that the MIND diet, which is a combination of The Mediterranean diet and the DASH diet, is beneficial for cognitive processing and longevity. Information about this diet can be found in The MIND Diet, a book by Doyne Keel, MS, RDN, and online at [employer]bookDist[employer].si  Finances, Power of Attorney and Advance Directives: You should  consider putting legal safeguards in place with regard to financial and medical decision making. While the spouse always has power of attorney for medical and financial issues in the absence of any form, you should consider what you want in case the spouse / caregiver is no longer around or capable of making decisions.

## 2021-11-22 ENCOUNTER — Telehealth: Payer: Self-pay | Admitting: *Deleted

## 2021-11-22 LAB — VITAMIN B12: Vitamin B-12: 459 pg/mL (ref 232–1245)

## 2021-11-22 LAB — TSH: TSH: 4.49 u[IU]/mL (ref 0.450–4.500)

## 2021-11-22 NOTE — Progress Notes (Signed)
Please call and advise the patient that the recent labs we checked were within normal limits. We checked vitamin B12 level and thyroid function.. No further action is required on these tests at this time. Please remind patient to keep any upcoming appointments or tests and to call us with any interim questions, concerns, problems or updates. Thanks,   Jolon Degante, MD  

## 2021-11-22 NOTE — Telephone Encounter (Signed)
-----   Message from Alric Ran, MD sent at 11/22/2021  8:46 AM EDT ----- Please call and advise the patient that the recent labs we checked were within normal limits. We checked  vitamin B12 level and thyroid function. No further action is required on these tests at this time. Please remind patient to keep any upcoming appointments or tests and to call us with any interim questions, concerns, problems or updates. Thanks,   Alric Ran, MD

## 2021-11-22 NOTE — Telephone Encounter (Signed)
I spoke to the patient's wife on DPR. She is aware of the results.

## 2021-12-23 NOTE — Progress Notes (Unsigned)
HISTORY AND PHYSICAL     CC:  follow up. Requesting Provider:  Lavone Orn, MD  HPI: This is a 86 y.o. male who is here today for follow up for PAD.  Pt has hx of angiogram with right common and external iliac artery stenting on 08/19/2021 by Dr. Stanford Breed for lifestyle limiting claudication.  Pt was last seen 6/6/202 and at that time, he was doing better, but still had some disabling claudication on prolonged ambulation. Right was worse than left.  He did not have wounds or rest pain. His ABI was improved and he was scheduled for 3 month follow up.    The pt returns today for follow up.  He reports some discomfort in his right > left leg. This occurs mostly on going up stairs or an incline. It is improved after rest. He describes it as just a soreness. He denies any pain at rest or pain that wakes him up from sleep. He has no tissue changes. He says he does not walk much at all. He has been compliant with his aspirin and statin. He took Plavix x 1 month as directed and is no longer on it  The pt is on a statin for cholesterol management.    The pt is on an aspirin.  Other AC:  None The pt is on CCB, BB for hypertension.  The pt does not have diabetes. Tobacco hx:  former  Pt does not have family hx of AAA.  Past Medical History:  Diagnosis Date   BPH (benign prostatic hyperplasia)    Colon polyps    Coronary artery disease    Dyslipidemia    ED (erectile dysfunction)    Hydrocele    Hyperlipidemia    Hypertension    MI (myocardial infarction) ([place]) 1992   Nocturnal enuresis     Past Surgical History:  Procedure Laterality Date   ABDOMINAL AORTOGRAM W/LOWER EXTREMITY Bilateral 08/19/2021   Procedure: ABDOMINAL AORTOGRAM W/LOWER EXTREMITY;  Surgeon: Cherre Robins, MD;  Location: [facility];  Service: Cardiovascular;  Laterality: Bilateral;   COLONOSCOPY W/ POLYPECTOMY     CORONARY ANGIOPLASTY     with stents   KNEE ARTHROSCOPY     PERIPHERAL VASCULAR INTERVENTION   08/19/2021   Procedure: PERIPHERAL VASCULAR INTERVENTION;  Surgeon: Cherre Robins, MD;  Location: [facility];  Service: Cardiovascular;;  Right Iliac   RECTAL VILLOUS ADENOMA EXCISION     VASECTOMY      No Known Allergies  Current Outpatient Medications  Medication Sig Dispense Refill   amLODipine (NORVASC) 5 MG tablet Take 5 mg by mouth at bedtime.     aspirin 325 MG tablet Take 325 mg by mouth at bedtime.     atorvastatin (LIPITOR) 40 MG tablet Take 40 mg by mouth at bedtime.     Cholecalciferol (VITAMIN D3) 50 MCG (2000 UT) capsule Take 2,000 Units by mouth at bedtime.     citalopram (CELEXA) 20 MG tablet Take 20 mg by mouth at bedtime.     fluocinonide (LIDEX) 0.05 % external solution Apply 1 application. topically daily as needed (eczema).     fluticasone (FLONASE) 50 MCG/ACT nasal spray Place 1 spray into both nostrils daily as needed for congestion.     galantamine (RAZADYNE ER) 8 MG 24 hr capsule Take 16 mg by mouth at bedtime.     memantine (NAMENDA) 10 MG tablet Take 1 tablet (10 mg total) by mouth 2 (two) times daily. 60 tablet 6  metoprolol succinate (TOPROL-XL) 50 MG 24 hr tablet Take 50 mg by mouth at bedtime.     Multiple Vitamin (MULTIVITAMIN WITH MINERALS) TABS Take 1 tablet by mouth at bedtime.     clopidogrel (PLAVIX) 75 MG tablet TAKE 1 TABLET(75 MG) BY MOUTH DAILY (Patient not taking: Reported on 12/27/2021) 30 tablet 0   No current facility-administered medications for this visit.    No family history on file.  Social History   Socioeconomic History   Marital status: Married    Spouse name: Not on file   Number of children: Not on file   Years of education: Not on file   Highest education level: Not on file  Occupational History   Not on file  Tobacco Use   Smoking status: Former    Packs/day: 1.00    Years: 60.00    Total pack years: 60.00    Types: Cigarettes    Quit date: 08/17/1990    Years since quitting: 31.3    Passive exposure: Never    Smokeless tobacco: Current    Types: Snuff   Tobacco comments:    quit 1992  Substance and Sexual Activity   Alcohol use: Yes    Comment: occasionally   Drug use: No   Sexual activity: Not on file  Other Topics Concern   Not on file  Social History Narrative   Not on file   Social Determinants of Health   Financial Resource Strain: Not on file  Food Insecurity: Not on file  Transportation Needs: Not on file  Physical Activity: Not on file  Stress: Not on file  Social Connections: Not on file  Intimate Partner Violence: Not on file     REVIEW OF SYSTEMS:  '[X]'$  denotes positive finding, '[ ]'$  denotes negative finding Cardiac  Comments:  Chest pain or chest pressure:    Shortness of breath upon exertion:    Short of breath when lying flat:    Irregular heart rhythm:        Vascular    Pain in calf, thigh, or hip brought on by ambulation:    Pain in feet at night that wakes you up from your sleep:     Blood clot in your veins:    Leg swelling:         Pulmonary    Oxygen at home:    Productive cough:     Wheezing:         Neurologic    Sudden weakness in arms or legs:     Sudden numbness in arms or legs:     Sudden onset of difficulty speaking or slurred speech:    Temporary loss of vision in one eye:     Problems with dizziness:         Gastrointestinal    Blood in stool:     Vomited blood:         Genitourinary    Burning when urinating:     Blood in urine:        Psychiatric    Major depression:         Hematologic    Bleeding problems:    Problems with blood clotting too easily:        Skin    Rashes or ulcers:        Constitutional    Fever or chills:      PHYSICAL EXAMINATION:  General:  WDWN in NAD; vital signs documented above Gait: Normal HENT: WNL, normocephalic  Pulmonary: normal non-labored breathing , without wheezing Cardiac: regular HR, without carotid bruit Abdomen: soft, NT; aortic pulse is not palpable Vascular  Exam/Pulses:  Right Left  Radial 2+ (normal) 2+ (normal)  Femoral 2+ (normal) 2+ (normal)  Popliteal 2+ (normal) 2+ (normal)  DP 2+ (normal) absent  PT 1+ (weak) 1+ (weak)   Extremities: without ischemic changes, without Gangrene , without cellulitis; without open wounds Musculoskeletal: no muscle wasting or atrophy  Neurologic: A&O X 3 Psychiatric:  The pt has Normal affect.   Non-Invasive Vascular Imaging:    Previous ABI's/TBI's on 09/27/2021: Right:  1.15/0.75 - Great toe pressure: 114 Left:  1.12/0.58 - Great toe pressure: 88  +-------+-----------+-----------+------------+------------+  ABI/TBIToday's ABIToday's TBIPrevious ABIPrevious TBI  +-------+-----------+-----------+------------+------------+  Right  0.97       0.80       1.15        0.75          +-------+-----------+-----------+------------+------------+  Left   0.91       0.74       1.12        0.58          +-------+-----------+-----------+------------+------------+   VAS US Aorta/ IVC/ iliacs: Right common and right external iliac artery stents patent without stenosis  ASSESSMENT/PLAN:: 86 y.o. male here for follow up for PAD with hx of angiogram with right common and external iliac artery stenting on 08/19/2021 by Dr. Stanford Breed for lifestyle limiting claudication. He continues to have some claudication symptoms on stairs and inclines. This is not disabling. He has no rest pain or tissue loss.  - ABIs today show slight decrease bilaterally from last study. TBIs essentially unchanged - Duplex shows patent right common and external iliac artery stents - continue Aspirin, statin - encourage daily exercise/ walking regimen  - pt will f/u in 3 months with ABI and Aorto/iliac/ IVC duplex.   Paulo Fruit PA-C [facility] [phone]  Clinic MD:   [facility]

## 2021-12-27 ENCOUNTER — Other Ambulatory Visit (HOSPITAL_COMMUNITY): Payer: Self-pay | Admitting: Vascular Surgery

## 2021-12-27 ENCOUNTER — Ambulatory Visit (HOSPITAL_COMMUNITY)
Admission: RE | Admit: 2021-12-27 | Discharge: 2021-12-27 | Disposition: A | Discharge status: Home / Self Care | Payer: PPO | Source: Ambulatory Visit | Attending: Vascular Surgery | Admitting: Vascular Surgery

## 2021-12-27 ENCOUNTER — Ambulatory Visit: Payer: PPO | Admitting: Physician Assistant

## 2021-12-27 ENCOUNTER — Ambulatory Visit (INDEPENDENT_AMBULATORY_CARE_PROVIDER_SITE_OTHER)
Admission: RE | Admit: 2021-12-27 | Discharge: 2021-12-27 | Disposition: A | Discharge status: Home / Self Care | Payer: PPO | Source: Ambulatory Visit | Attending: Vascular Surgery | Admitting: Vascular Surgery

## 2021-12-27 VITALS — BP 128/70 | HR 54 | Temp 98.1°F | Resp 20 | Ht 71.0 in | Wt 230.0 lb

## 2021-12-27 DIAGNOSIS — I739 Peripheral vascular disease, unspecified: Secondary | ICD-10-CM

## 2021-12-27 DIAGNOSIS — I2510 Atherosclerotic heart disease of native coronary artery without angina pectoris: Secondary | ICD-10-CM | POA: Diagnosis not present

## 2021-12-27 DIAGNOSIS — R946 Abnormal results of thyroid function studies: Secondary | ICD-10-CM | POA: Diagnosis not present

## 2021-12-27 DIAGNOSIS — E782 Mixed hyperlipidemia: Secondary | ICD-10-CM | POA: Diagnosis not present

## 2021-12-27 DIAGNOSIS — Z0000 Encounter for general adult medical examination without abnormal findings: Secondary | ICD-10-CM | POA: Diagnosis not present

## 2021-12-27 DIAGNOSIS — I70213 Atherosclerosis of native arteries of extremities with intermittent claudication, bilateral legs: Secondary | ICD-10-CM | POA: Diagnosis not present

## 2021-12-27 DIAGNOSIS — Z23 Encounter for immunization: Secondary | ICD-10-CM | POA: Diagnosis not present

## 2021-12-27 DIAGNOSIS — I10 Essential (primary) hypertension: Secondary | ICD-10-CM | POA: Diagnosis not present

## 2021-12-27 DIAGNOSIS — F0390 Unspecified dementia without behavioral disturbance: Secondary | ICD-10-CM | POA: Diagnosis not present

## 2021-12-27 DIAGNOSIS — Z1331 Encounter for screening for depression: Secondary | ICD-10-CM | POA: Diagnosis not present

## 2021-12-29 ENCOUNTER — Other Ambulatory Visit: Payer: Self-pay

## 2021-12-29 DIAGNOSIS — I739 Peripheral vascular disease, unspecified: Secondary | ICD-10-CM

## 2021-12-29 DIAGNOSIS — I70211 Atherosclerosis of native arteries of extremities with intermittent claudication, right leg: Secondary | ICD-10-CM

## 2022-01-18 ENCOUNTER — Emergency Department (HOSPITAL_BASED_OUTPATIENT_CLINIC_OR_DEPARTMENT_OTHER): Payer: PPO

## 2022-01-18 ENCOUNTER — Emergency Department (HOSPITAL_BASED_OUTPATIENT_CLINIC_OR_DEPARTMENT_OTHER)
Admission: EM | Admit: 2022-01-18 | Discharge: 2022-01-19 | Disposition: A | Discharge status: Home / Self Care | Payer: PPO | Attending: Emergency Medicine | Admitting: Emergency Medicine

## 2022-01-18 ENCOUNTER — Other Ambulatory Visit: Payer: Self-pay

## 2022-01-18 DIAGNOSIS — K8689 Other specified diseases of pancreas: Secondary | ICD-10-CM

## 2022-01-18 DIAGNOSIS — Z79899 Other long term (current) drug therapy: Secondary | ICD-10-CM | POA: Insufficient documentation

## 2022-01-18 DIAGNOSIS — K760 Fatty (change of) liver, not elsewhere classified: Secondary | ICD-10-CM | POA: Diagnosis not present

## 2022-01-18 DIAGNOSIS — Z87891 Personal history of nicotine dependence: Secondary | ICD-10-CM | POA: Insufficient documentation

## 2022-01-18 DIAGNOSIS — Z955 Presence of coronary angioplasty implant and graft: Secondary | ICD-10-CM | POA: Diagnosis not present

## 2022-01-18 DIAGNOSIS — R109 Unspecified abdominal pain: Secondary | ICD-10-CM | POA: Diagnosis not present

## 2022-01-18 DIAGNOSIS — Z7902 Long term (current) use of antithrombotics/antiplatelets: Secondary | ICD-10-CM | POA: Diagnosis not present

## 2022-01-18 DIAGNOSIS — K869 Disease of pancreas, unspecified: Secondary | ICD-10-CM | POA: Diagnosis not present

## 2022-01-18 DIAGNOSIS — Z20822 Contact with and (suspected) exposure to covid-19: Secondary | ICD-10-CM | POA: Diagnosis not present

## 2022-01-18 DIAGNOSIS — R1900 Intra-abdominal and pelvic swelling, mass and lump, unspecified site: Secondary | ICD-10-CM | POA: Diagnosis not present

## 2022-01-18 DIAGNOSIS — I2510 Atherosclerotic heart disease of native coronary artery without angina pectoris: Secondary | ICD-10-CM | POA: Diagnosis not present

## 2022-01-18 DIAGNOSIS — R1084 Generalized abdominal pain: Secondary | ICD-10-CM | POA: Diagnosis not present

## 2022-01-18 DIAGNOSIS — K921 Melena: Secondary | ICD-10-CM | POA: Diagnosis not present

## 2022-01-18 DIAGNOSIS — Z7982 Long term (current) use of aspirin: Secondary | ICD-10-CM | POA: Insufficient documentation

## 2022-01-18 DIAGNOSIS — F0390 Unspecified dementia without behavioral disturbance: Secondary | ICD-10-CM | POA: Insufficient documentation

## 2022-01-18 DIAGNOSIS — I10 Essential (primary) hypertension: Secondary | ICD-10-CM | POA: Diagnosis not present

## 2022-01-18 DIAGNOSIS — R059 Cough, unspecified: Secondary | ICD-10-CM | POA: Diagnosis not present

## 2022-01-18 DIAGNOSIS — I700 Atherosclerosis of aorta: Secondary | ICD-10-CM | POA: Diagnosis not present

## 2022-01-18 LAB — CBC
HCT: 44.7 % (ref 39.0–52.0)
Hemoglobin: 15.4 g/dL (ref 13.0–17.0)
MCH: 30.8 pg (ref 26.0–34.0)
MCHC: 34.5 g/dL (ref 30.0–36.0)
MCV: 89.4 fL (ref 80.0–100.0)
Platelets: 212 10*3/uL (ref 150–400)
RBC: 5 MIL/uL (ref 4.22–5.81)
RDW: 13.3 % (ref 11.5–15.5)
WBC: 7.1 10*3/uL (ref 4.0–10.5)
nRBC: 0 % (ref 0.0–0.2)

## 2022-01-18 LAB — COMPREHENSIVE METABOLIC PANEL
ALT: 26 U/L (ref 0–44)
AST: 30 U/L (ref 15–41)
Albumin: 4.1 g/dL (ref 3.5–5.0)
Alkaline Phosphatase: 134 U/L — ABNORMAL HIGH (ref 38–126)
Anion gap: 7 (ref 5–15)
BUN: 17 mg/dL (ref 8–23)
CO2: 28 mmol/L (ref 22–32)
Calcium: 10.1 mg/dL (ref 8.9–10.3)
Chloride: 102 mmol/L (ref 98–111)
Creatinine, Ser: 1.16 mg/dL (ref 0.61–1.24)
GFR, Estimated: >60 mL/min (ref >60)
Glucose, Bld: 115 mg/dL — ABNORMAL HIGH (ref 70–99)
Potassium: 4.3 mmol/L (ref 3.5–5.1)
Sodium: 137 mmol/L (ref 135–145)
Total Bilirubin: 1.3 mg/dL — ABNORMAL HIGH (ref 0.3–1.2)
Total Protein: 8.2 g/dL — ABNORMAL HIGH (ref 6.5–8.1)

## 2022-01-18 LAB — SARS CORONAVIRUS 2 BY RT PCR: SARS Coronavirus 2 by RT PCR: NEGATIVE

## 2022-01-18 LAB — OCCULT BLOOD X 1 CARD TO LAB, STOOL: Fecal Occult Bld: NEGATIVE

## 2022-01-18 MED ORDER — IOHEXOL 300 MG/ML  SOLN
125.0000 mL | Freq: Once | INTRAMUSCULAR | Status: AC | PRN
  Administered 2022-01-18: 125 mL via INTRAVENOUS

## 2022-01-18 NOTE — Discharge Instructions (Addendum)
COVID test is pending.  Follow-up with Dr. Delene Ruffini office and call tomorrow to see the results if not familiar with using MyChart.  Chest x-ray here today without any acute findings.  No evidence of pneumonia.  CT scan of the abdomen without any acute abdominal abnormalities he does have a lot of peripheral vascular disease.  Stool negative for blood this probably black due to the Pepto-Bismol.  Labs very reassuring without significant abnormalities.  The dental finding on the CT scan of the abdomen raise concerns for possible pancreatic mass.  Recommend following back up with primary care doctor for additional work-up.

## 2022-01-18 NOTE — ED Triage Notes (Signed)
Via POV from home, sent by PCP Dr. Laurann Montana for eval due to 1 week of abdominal pain and black stool. Pt reports upper abdominal pain rated 5/10 intermittently. Pt denies n/v but reports diarrhea several times since symptom onset a week ago. Pt does not take blood thinners.

## 2022-01-18 NOTE — ED Provider Notes (Signed)
[facility] EMERGENCY DEPARTMENT Provider Note   CSN: [id number] Arrival date & time: 01/18/22  1916     History  Chief Complaint  Patient presents with   Abdominal Pain    Timothy Arias is a 86 y.o. male.  Patient with a history of dementia.  Patient sent in from Drs. Griffin's office for concerns for black bowel movements that been going on for a number of days.  Patient has been taking Pepto-Bismol at home for about a week.  No evidence of any red blood in the bowel movements.  Patient's wife states that he has been a little bit more fatigued than usual sleeping more than usual has had loose bowel movements which is kind of baseline for him complaining of some generalized abdominal pain but no nausea or vomiting.  But patient's wife tells me that he did have 1 episode of nausea and vomiting a week ago.  It was complaining of some abdominal pain Tuesday through Sunday but has not complained of any abdominal pain since Sunday.  States he has had a little bit of a cough but no fevers.  Past medical history significant for patient not being on any blood thinners.  History of hypertension coronary artery disease hyperlipidemia peripheral vascular disease patient is a former smoker quit 1992.  Patient's vital signs very reassuring temp 98.4 heart rate 77 blood pressure 146/95 respirations 18 oxygen saturation is 97% on room air.       Home Medications Prior to Admission medications   Medication Sig Start Date End Date Taking? Authorizing Provider  amLODipine (NORVASC) 5 MG tablet Take 5 mg by mouth at bedtime. 09/27/20   [provider]  aspirin 325 MG tablet Take 325 mg by mouth at bedtime.    [provider]  atorvastatin (LIPITOR) 40 MG tablet Take 40 mg by mouth at bedtime. 09/27/20   [provider]  Cholecalciferol (VITAMIN D3) 50 MCG (2000 UT) capsule Take 2,000 Units by mouth at bedtime.    [provider]  citalopram (CELEXA) 20 MG  tablet Take 20 mg by mouth at bedtime. 09/27/20   [provider]  clopidogrel (PLAVIX) 75 MG tablet TAKE 1 TABLET(75 MG) BY MOUTH DAILY Patient not taking: Reported on 12/27/2021 09/30/21   Waynetta Sandy, MD  fluocinonide (LIDEX) 0.05 % external solution Apply 1 application. topically daily as needed (eczema).    [provider]  fluticasone (FLONASE) 50 MCG/ACT nasal spray Place 1 spray into both nostrils daily as needed for congestion. 09/17/14   [provider]  galantamine (RAZADYNE ER) 8 MG 24 hr capsule Take 16 mg by mouth at bedtime. 09/27/20   [provider]  memantine (NAMENDA) 10 MG tablet Take 1 tablet (10 mg total) by mouth 2 (two) times daily. 11/21/21 06/19/22  Alric Ran, MD  metoprolol succinate (TOPROL-XL) 50 MG 24 hr tablet Take 50 mg by mouth at bedtime. 09/27/20   [provider]  Multiple Vitamin (MULTIVITAMIN WITH MINERALS) TABS Take 1 tablet by mouth at bedtime.    [provider]      Allergies    Patient has no known allergies.    Review of Systems   Review of Systems  Unable to perform ROS: Dementia    Physical Exam Updated Vital Signs BP (!) 150/73   Pulse (!) 49   Temp 97.9 F (36.6 C) (Oral)   Resp 19   Ht 1.803 m (_0 )   Wt 99.8 kg  SpO2 96%   BMI 30.68 kg/m  Physical Exam Vitals and nursing note reviewed.  Constitutional:      General: He is not in acute distress.    Appearance: Normal appearance. He is well-developed. He is not ill-appearing or toxic-appearing.  HENT:     Head: Normocephalic and atraumatic.  Eyes:     Extraocular Movements: Extraocular movements intact.     Conjunctiva/sclera: Conjunctivae normal.     Pupils: Pupils are equal, round, and reactive to light.  Cardiovascular:     Rate and Rhythm: Normal rate and regular rhythm.     Heart sounds: No murmur heard. Pulmonary:     Effort: Pulmonary effort is normal. No respiratory distress.     Breath sounds: Normal  breath sounds.  Abdominal:     General: There is no distension.     Palpations: Abdomen is soft.     Tenderness: There is no abdominal tenderness. There is no guarding.  Genitourinary:    Rectum: Normal. Guaiac result negative.     Comments: Stool black in color but Hemoccult negative. Musculoskeletal:        General: No swelling.     Cervical back: Neck supple.  Skin:    General: Skin is warm and dry.     Capillary Refill: Capillary refill takes less than 2 seconds.  Neurological:     Mental Status: He is alert. Mental status is at baseline.  Psychiatric:        Mood and Affect: Mood normal.     ED Results / Procedures / Treatments   Labs (all labs ordered are listed, but only abnormal results are displayed) Labs Reviewed  COMPREHENSIVE METABOLIC PANEL - Abnormal; Notable for the following components:      Result Value   Glucose, Bld 115 (*)    Total Protein 8.2 (*)    Alkaline Phosphatase 134 (*)    Total Bilirubin 1.3 (*)    All other components within normal limits  SARS CORONAVIRUS 2 BY RT PCR  CBC  OCCULT BLOOD X 1 CARD TO LAB, STOOL  POC OCCULT BLOOD, ED  ABO/RH    EKG EKG Interpretation  Date/Time:  Wednesday January 18 2022 19:45:23 EDT Ventricular Rate:  74 PR Interval:  148 QRS Duration: 92 QT Interval:  418 QTC Calculation: 463 R Axis:   3 Text Interpretation: Normal sinus rhythm Inferior infarct , age undetermined ST & T wave abnormality, consider lateral ischemia Abnormal ECG When compared with ECG of 19-Aug-2021 09:53, PREVIOUS ECG IS PRESENT Confirmed by Fredia Sorrow [phone]) on 01/18/2022 9:31:57 PM  Radiology CT Abdomen Pelvis W Contrast  Result Date: 01/18/2022 CLINICAL DATA:  Abdominal pain, acute, nonlocalized.  Melena. EXAM: CT ABDOMEN AND PELVIS WITH CONTRAST TECHNIQUE: Multidetector CT imaging of the abdomen and pelvis was performed using the standard protocol following bolus administration of intravenous contrast. RADIATION DOSE  REDUCTION: This exam was performed according to the departmental dose-optimization program which includes automated exposure control, adjustment of the mA and/or kV according to patient size and/or use of iterative reconstruction technique. CONTRAST:  170m OMNIPAQUE IOHEXOL 300 MG/ML  SOLN COMPARISON:  01/13/2012 FINDINGS: Lower chest: The visualized lung bases are clear. Extensive multi-vessel coronary artery calcification. Global cardiac size within normal limits. No pericardial effusion. Hepatobiliary: Mild hepatic steatosis. No enhancing intrahepatic mass. Gallbladder unremarkable. No intra or extrahepatic biliary ductal dilation. Pancreas: The pancreatic parenchyma is relatively well preserved. Despite this, there is more focal geographic solidity within the head of the pancreas and  an underlying mass is not excluded though this may simply represent relative sparing of fatty infiltration. This does appear more prominent than on prior examination. The pancreatic duct is not dilated. No peripancreatic inflammatory changes or lymphadenopathy is seen. Spleen: Unremarkable Adrenals/Urinary Tract: Adrenal glands are unremarkable. Kidneys are normal, without renal calculi, focal lesion, or hydronephrosis. Bladder is unremarkable. Stomach/Bowel: Stomach is within normal limits. Appendix appears normal. No evidence of bowel wall thickening, distention, or inflammatory changes. Vascular/Lymphatic: Serial stents are seen within the right common and proximal right external iliac artery. There is moderate perivascular inflammatory stranding involving the stented segment which is nonspecific. This may represent perivascular hemorrhage in the setting acute intervention. Less likely, acute infection could appear similarly. No pseudoaneurysm or active extravasation is identified. Wide patency of the stented segment. Extensive aortoiliac mixed atherosclerotic plaque. Hemodynamically significant stenoses of the renal artery  origins are noted bilaterally. Moderate stenosis of the inferior mesenteric artery origin noted. Less than 50% stenosis of the left common iliac artery origin. 50% stenosis of the right common femoral artery noted. No pathologic adenopathy within the abdomen and pelvis. Reproductive: Marked prostatic enlargement. Other: Small fat containing bilateral inguinal hernias. Musculoskeletal: Advanced degenerative changes are seen within the lumbar spine and hips bilaterally. No acute bone abnormality. IMPRESSION: 1. Extensive multi-vessel coronary artery calcification. 2. Mild hepatic steatosis. 3. Focal geographic solidity within the head of the pancreas. While this may simply represent relative sparing of fatty infiltration, an underlying mass is not excluded. This does appear more prominent than on prior examination. This could be better assessed with dedicated pancreatic mass protocol MRI examination once the patient's acute issues have resolved. 4. Stented right common and external iliac arteries with moderate perivascular inflammatory stranding involving the stented segment. This may represent perivascular hemorrhage in the setting of acute intervention. Less likely, acute infection could appear similarly. No pseudoaneurysm or active extravasation is identified. Correlation with surgical history is recommended. 5. Extensive aortoiliac atherosclerotic disease. Hemodynamically significant stenoses of the renal artery origins bilaterally. Moderate stenosis of the inferior mesenteric artery origin. Correlation for evidence of clinically significant renal artery stenosis or lower extremity arterial insufficiency/claudication may be helpful. 6. Marked prostatic enlargement. Aortic Atherosclerosis (ICD10-I70.0). Electronically Signed   By: Fidela Salisbury M.D.   On: 01/18/2022 22:30   DG Chest Port 1 View  Result Date: 01/18/2022 CLINICAL DATA:  Cough EXAM: PORTABLE CHEST 1 VIEW COMPARISON:  06/09/2019 FINDINGS: Heart  and mediastinal contours are within normal limits. No focal opacities or effusions. No acute bony abnormality. Aortic atherosclerosis. IMPRESSION: No active disease. Electronically Signed   By: Rolm Baptise M.D.   On: 01/18/2022 22:29    Procedures Procedures    Medications Ordered in ED Medications  iohexol (OMNIPAQUE) 300 MG/ML solution 125 mL (125 mLs Intravenous Contrast Given 01/18/22 2153)    ED Course/ Medical Decision Making/ A&P                           Medical Decision Making Amount and/or Complexity of Data Reviewed Labs: ordered. Radiology: ordered.  Risk Prescription drug management.   The black stool most likely secondary to the Pepto-Bismol.  Because he is Hemoccult negative.  Patient's hemoglobin very normal complete metabolic panel without significant abnormalities other than total bili up to 1.3 and alk phos of 134.  CBC white count was 7.1 hemoglobin 15.4 platelets are 212,000.  Chest x-ray without any acute findings.  CT of the abdomen without any  acute intra-abdominal process but does have significant peripheral vascular disease and extensive coronary artery disease calcifications.  Finding of questionable pancreatic mass and the recommending MRI follow-up for that.  Nothing acute that needs to be done tonight.   Final Clinical Impression(s) / ED Diagnoses Final diagnoses:  Generalized abdominal pain  Pancreatic mass    Rx / DC Orders ED Discharge Orders     None         Fredia Sorrow, MD 01/18/22 2316

## 2022-01-31 ENCOUNTER — Telehealth: Payer: Self-pay | Admitting: Neurology

## 2022-01-31 MED ORDER — MEMANTINE HCL 10 MG PO TABS: 10.0000 mg | ORAL_TABLET | Freq: Two times a day (BID) | ORAL | 6 refills | Status: DC

## 2022-01-31 NOTE — Telephone Encounter (Signed)
[facility]) requesting refill for memantine (NAMENDA) 10 MG tablet

## 2022-01-31 NOTE — Telephone Encounter (Signed)
Refill has been sent.  °

## 2022-02-21 DIAGNOSIS — R935 Abnormal findings on diagnostic imaging of other abdominal regions, including retroperitoneum: Secondary | ICD-10-CM | POA: Diagnosis not present

## 2022-02-21 DIAGNOSIS — Z23 Encounter for immunization: Secondary | ICD-10-CM | POA: Diagnosis not present

## 2022-03-04 ENCOUNTER — Other Ambulatory Visit: Payer: Self-pay | Admitting: Internal Medicine

## 2022-03-04 DIAGNOSIS — R935 Abnormal findings on diagnostic imaging of other abdominal regions, including retroperitoneum: Secondary | ICD-10-CM

## 2022-03-28 ENCOUNTER — Ambulatory Visit (INDEPENDENT_AMBULATORY_CARE_PROVIDER_SITE_OTHER): Payer: PPO | Admitting: Physician Assistant

## 2022-03-28 ENCOUNTER — Ambulatory Visit (INDEPENDENT_AMBULATORY_CARE_PROVIDER_SITE_OTHER)
Admission: RE | Admit: 2022-03-28 | Discharge: 2022-03-28 | Disposition: A | Discharge status: Home / Self Care | Payer: PPO | Source: Ambulatory Visit | Attending: Vascular Surgery | Admitting: Vascular Surgery

## 2022-03-28 ENCOUNTER — Encounter: Payer: Self-pay | Admitting: Physician Assistant

## 2022-03-28 ENCOUNTER — Ambulatory Visit (HOSPITAL_COMMUNITY)
Admission: RE | Admit: 2022-03-28 | Discharge: 2022-03-28 | Disposition: A | Discharge status: Home / Self Care | Payer: PPO | Source: Ambulatory Visit | Attending: Vascular Surgery | Admitting: Vascular Surgery

## 2022-03-28 VITALS — BP 144/85 | HR 67 | Temp 98.3°F | Resp 20 | Ht 71.0 in | Wt 226.3 lb

## 2022-03-28 DIAGNOSIS — I739 Peripheral vascular disease, unspecified: Secondary | ICD-10-CM | POA: Insufficient documentation

## 2022-03-28 DIAGNOSIS — I70211 Atherosclerosis of native arteries of extremities with intermittent claudication, right leg: Secondary | ICD-10-CM | POA: Diagnosis not present

## 2022-03-28 NOTE — Progress Notes (Signed)
[facility] [place] HISTORY AND PHYSICAL   History of Present Illness:  This is a 86 y.o. male who is here today for follow up for PAD.  Pt has hx of angiogram with right common and external iliac artery stenting on 08/19/2021 by Dr. Stanford Breed for lifestyle limiting claudication.   He had previous complaints of right > left LE discomfort with stairs and inclines. This improves with rest.  He denies rest pain or non healing wounds.  He does feel like his pain has improved since the stents where placed.  He is not very active on a daily basis.   He is medically managed on ASA and Statin daily.    Past Medical History:  Diagnosis Date   BPH (benign prostatic hyperplasia)    Colon polyps    Coronary artery disease    Dyslipidemia    ED (erectile dysfunction)    Hydrocele    Hyperlipidemia    Hypertension    MI (myocardial infarction) ([place]) 1992   Nocturnal enuresis     Past Surgical History:  Procedure Laterality Date   ABDOMINAL AORTOGRAM W/LOWER EXTREMITY Bilateral 08/19/2021   Procedure: ABDOMINAL AORTOGRAM W/LOWER EXTREMITY;  Surgeon: Cherre Robins, MD;  Location: [facility];  Service: Cardiovascular;  Laterality: Bilateral;   COLONOSCOPY W/ POLYPECTOMY     CORONARY ANGIOPLASTY     with stents   KNEE ARTHROSCOPY     PERIPHERAL VASCULAR INTERVENTION  08/19/2021   Procedure: PERIPHERAL VASCULAR INTERVENTION;  Surgeon: Cherre Robins, MD;  Location: [facility];  Service: Cardiovascular;;  Right Iliac   RECTAL VILLOUS ADENOMA EXCISION     VASECTOMY      ROS:   General:  No weight loss, Fever, chills  HEENT: No recent headaches, no nasal bleeding, no visual changes, no sore throat  Neurologic: No dizziness, blackouts, seizures. No recent symptoms of stroke or mini- stroke. No recent episodes of slurred speech, or temporary blindness.  Cardiac: No recent episodes of chest pain/pressure, no shortness of breath at rest.  No shortness of breath  with exertion.  Denies history of atrial fibrillation or irregular heartbeat  Vascular: No history of rest pain in feet.  No history of claudication.  No history of non-healing ulcer, No history of DVT   Pulmonary: No home oxygen, no productive cough, no hemoptysis,  No asthma or wheezing  Musculoskeletal:  '[ ]'$  Arthritis, '[ ]'$  Low back pain,  '[ ]'$  Joint pain  Hematologic:No history of hypercoagulable state.  No history of easy bleeding.  No history of anemia  Gastrointestinal: No hematochezia or melena,  No gastroesophageal reflux, no trouble swallowing  Urinary: '[ ]'$  chronic Kidney disease, '[ ]'$  on HD - '[ ]'$  MWF or '[ ]'$  TTHS, '[ ]'$  Burning with urination, '[ ]'$  Frequent urination, '[ ]'$  Difficulty urinating;   Skin: No rashes  Psychological: No history of anxiety,  No history of depression  Social History Social History   Tobacco Use   Smoking status: Former    Packs/day: 1.00    Years: 60.00    Total pack years: 60.00    Types: Cigarettes    Quit date: 08/17/1990    Years since quitting: 31.6    Passive exposure: Never   Smokeless tobacco: Current    Types: Snuff   Tobacco comments:    quit 1992  Substance Use Topics   Alcohol use: Yes    Comment: occasionally   Drug use: No  Family History No family history on file.  Allergies  No Known Allergies   Current Outpatient Medications  Medication Sig Dispense Refill   amLODipine (NORVASC) 5 MG tablet Take 5 mg by mouth at bedtime.     aspirin 325 MG tablet Take 325 mg by mouth at bedtime.     atorvastatin (LIPITOR) 40 MG tablet Take 40 mg by mouth at bedtime.     Cholecalciferol (VITAMIN D3) 50 MCG (2000 UT) capsule Take 2,000 Units by mouth at bedtime.     citalopram (CELEXA) 20 MG tablet Take 20 mg by mouth at bedtime.     fluocinonide (LIDEX) 0.05 % external solution Apply 1 application. topically daily as needed (eczema).     fluticasone (FLONASE) 50 MCG/ACT nasal spray Place 1 spray into both nostrils daily as needed  for congestion.     galantamine (RAZADYNE ER) 8 MG 24 hr capsule Take 16 mg by mouth at bedtime.     memantine (NAMENDA) 10 MG tablet Take 1 tablet (10 mg total) by mouth 2 (two) times daily. 60 tablet 6   metoprolol succinate (TOPROL-XL) 50 MG 24 hr tablet Take 50 mg by mouth at bedtime.     Multiple Vitamin (MULTIVITAMIN WITH MINERALS) TABS Take 1 tablet by mouth at bedtime.     clopidogrel (PLAVIX) 75 MG tablet TAKE 1 TABLET(75 MG) BY MOUTH DAILY (Patient not taking: Reported on 03/28/2022) 30 tablet 0   No current facility-administered medications for this visit.    Physical Examination  Vitals:   03/28/22 0928  BP: (!) 144/85  Pulse: 67  Resp: 20  Temp: 98.3 F (36.8 C)  TempSrc: Temporal  SpO2: 95%  Weight: 226 lb 4.8 oz (102.6 kg)  Height: '5\' 11"'$  (1.803 m)    Body mass index is 31.56 kg/m.  General:  Alert and oriented, no acute distress HEENT: Normal Neck: No bruit or JVD Pulmonary: Clear to auscultation bilaterally Cardiac: Regular Rate and Rhythm without murmur Abdomen: Soft, non-tender, non-distended, no mass, no scars Skin: No rash Extremity Pulses:  2+ radial, brachial, femoral, dorsalis pedis, posterior tibial pulses bilaterally Musculoskeletal: No deformity or edema  Neurologic: Upper and lower extremity motor 5/5 and symmetric  DATA:  ABI Findings:  +---------+------------------+-----+--------+--------+  Right   Rt Pressure (mmHg)IndexWaveformComment   +---------+------------------+-----+--------+--------+  Brachial 155                                      +---------+------------------+-----+--------+--------+  PTA     148               0.95 biphasic          +---------+------------------+-----+--------+--------+  DP      145               0.94 biphasic          +---------+------------------+-----+--------+--------+  Great Toe120               0.77                   +---------+------------------+-----+--------+--------+    +---------+------------------+-----+--------+-------+  Left    Lt Pressure (mmHg)IndexWaveformComment  +---------+------------------+-----+--------+-------+  Brachial 152                                     +---------+------------------+-----+--------+-------+  PTA     179  1.15 biphasic         +---------+------------------+-----+--------+-------+  DP      188               1.21 biphasic         +---------+------------------+-----+--------+-------+  Great Toe112               0.72                  +---------+------------------+-----+--------+-------+   +-------+-----------+-----------+------------+------------+  ABI/TBIToday's ABIToday's TBIPrevious ABIPrevious TBI  +-------+-----------+-----------+------------+------------+  Right 0.95       0.77       0.97        0.80          +-------+-----------+-----------+------------+------------+  Left  1.21       0.72       0.91        0.74          +-------+-----------+-----------+------------+------------+    Right ABIs appear essentially unchanged. Left ABIs appear increased  compared to prior study on 12/27/2021.    Summary:  Right: Resting right ankle-brachial index is within normal range. The  right toe-brachial index is normal.   Left: Resting left ankle-brachial index is within normal range. The left  toe-brachial index is normal.   Right Stent(s):  +---------------+--------+--------+--------+--------------+  CIA           PSV cm/sStenosisWaveformComments        +---------------+--------+--------+--------+--------------+  Proximal Stent                         Not visualized  +---------------+--------+--------+--------+--------------+  Mid Stent      129                                     +---------------+--------+--------+--------+--------------+  Distal Stent   126                                      +---------------+--------+--------+--------+--------------+  Distal to Stent125                                     +---------------+--------+--------+--------+--------------+           Right Stent(s):  +---------------+--------+--------+--------+--------+  EIA           PSV cm/sStenosisWaveformComments  +---------------+--------+--------+--------+--------+  Prox to Stent  125                               +---------------+--------+--------+--------+--------+  Proximal Stent 100                               +---------------+--------+--------+--------+--------+  Mid Stent      117                               +---------------+--------+--------+--------+--------+  Distal Stent   78                                +---------------+--------+--------+--------+--------+  Distal to Stent83                                +---------------+--------+--------+--------+--------+  Summary:  Stenosis: +--------------------+-----------+  Location            Stent        +--------------------+-----------+  Right Common Iliac  no stenosis  +--------------------+-----------+  Right External Iliacno stenosis  +--------------------+-----------+   ASSESSMENT/PLAN:  86 y.o. male here for follow up for PAD with hx of angiogram with right common and external iliac artery stenting on 08/19/2021 by Dr. Stanford Breed for lifestyle limiting claudication.  His claudication symptoms have improved some post stent placement.   The duplex demonstrates no stenosis within the stent and the ABI is stable with evidence of calcifies left LE arteries.  I suggested he start a walking program and he will f/u in 9 month for repeat surveillance studies. If he develops rest pain or non healing wounds he will call our office.  He does have a seed callus plantar 5th metatarsal head I will refer him to [facility] for foot care.       Roxy Horseman PA-C [facility] [place] Office: [phone]  MD in clinic [place]

## 2022-03-30 ENCOUNTER — Ambulatory Visit
Admission: RE | Admit: 2022-03-30 | Discharge: 2022-03-30 | Disposition: A | Discharge status: Home / Self Care | Payer: PPO | Source: Ambulatory Visit | Attending: Internal Medicine | Admitting: Internal Medicine

## 2022-03-30 DIAGNOSIS — K869 Disease of pancreas, unspecified: Secondary | ICD-10-CM | POA: Diagnosis not present

## 2022-03-30 DIAGNOSIS — R935 Abnormal findings on diagnostic imaging of other abdominal regions, including retroperitoneum: Secondary | ICD-10-CM

## 2022-03-30 MED ORDER — GADOPICLENOL 0.5 MMOL/ML IV SOLN
10.0000 mL | Freq: Once | INTRAVENOUS | Status: AC | PRN
  Administered 2022-03-30: 10 mL via INTRAVENOUS

## 2022-04-03 ENCOUNTER — Other Ambulatory Visit: Payer: Self-pay

## 2022-04-03 DIAGNOSIS — I739 Peripheral vascular disease, unspecified: Secondary | ICD-10-CM

## 2022-04-03 DIAGNOSIS — I70213 Atherosclerosis of native arteries of extremities with intermittent claudication, bilateral legs: Secondary | ICD-10-CM

## 2022-05-29 ENCOUNTER — Ambulatory Visit (INDEPENDENT_AMBULATORY_CARE_PROVIDER_SITE_OTHER): Payer: HMO | Admitting: Neurology

## 2022-05-29 ENCOUNTER — Encounter: Payer: Self-pay | Admitting: Neurology

## 2022-05-29 VITALS — BP 140/68 | HR 55 | Ht 72.0 in | Wt 231.0 lb

## 2022-05-29 DIAGNOSIS — G301 Alzheimer's disease with late onset: Secondary | ICD-10-CM | POA: Diagnosis not present

## 2022-05-29 DIAGNOSIS — F02A0 Dementia in other diseases classified elsewhere, mild, without behavioral disturbance, psychotic disturbance, mood disturbance, and anxiety: Secondary | ICD-10-CM

## 2022-05-29 MED ORDER — GALANTAMINE HYDROBROMIDE ER 8 MG PO CP24: 16.0000 mg | ORAL_CAPSULE | Freq: Every day | ORAL | 4 refills | Status: DC

## 2022-05-29 MED ORDER — MEMANTINE HCL 10 MG PO TABS: 10.0000 mg | ORAL_TABLET | Freq: Two times a day (BID) | ORAL | 4 refills | Status: DC

## 2022-05-29 NOTE — Patient Instructions (Signed)
Continue with Galantamine 16 mg daily  Continue with Namenda 10 mg twice daily  We will check ATN profile to look for Alzheimer Biomarker's  Follow up in a year or sooner if worse

## 2022-05-29 NOTE — Progress Notes (Signed)
[facility]  PATIENT: Timothy Arias DOB: 1935-07-02  REQUESTING CLINICIAN: Lavone Orn, MD HISTORY FROM: Patient and spouse  REASON FOR VISIT: Worsening memory for the past year    HISTORICAL  CHIEF COMPLAINT:  Chief Complaint  Patient presents with   Follow-up    Rm 12. Patient is with wife, patient believes he has not been here before despite being here 6 months ago, wife states memory has declined since last visit. Wife is requesting help with patient but unsure where to start.    INTERVAL HISTORY 05/29/2022: Patient presents today, he is accompanied by wife. Last visit was a 6 months ago, since then, wife reports that he has been stable but he still is forgetful about recent events. For example needs constant reminder regarding today's appointment. He also needs reminder regarding self care. Wife took over the finance because he forget to pay some of the bills. He still drive, no recent accident and has not been lost in familiar places. Sleep is good, denies any agitation or irritability and no hallucinations.     HISTORY OF PRESENT ILLNESS:  This is a 87 year old man with past medical history of dementia, on galantamine, hypertension hyperlipidemia presenting with worsening memory.  Wife reported the memory has been worse in the past year, he has difficulty with short-term memory and patient reported remembering date is very hard for me for him.  He still drive and sometimes will forget his way to familiar places.  1 thing that he noted that he is sleeps a lot.  He still independent somewhat, still pays bills on time.    TBI:   No past history of TBI Stroke:   no past history of stroke Seizures:   no past history of seizures Sleep:   no history of sleep apnea.   Mood:   patient denies anxiety and depression  Functional status: dependent in most ADLs and IADLs Patient lives with spouse  Cooking: no issues  Cleaning: no issues  Shopping: no issues   Bathing: needs reminders Toileting: No help needed  Driving: Still drives but forget how to get to familiar places Bills: Denies   Ever left the stove on by accident?: no  Forget how to use items around the house?: no Getting lost going to familiar places?: yes  Forgetting loved ones names?: no  Word finding difficulty? Yes Sleep: Sleeps a lot    OTHER MEDICAL CONDITIONS: Hypertension, Hyperlipidemia,    REVIEW OF SYSTEMS: Full 14 system review of systems performed and negative with exception of: as noted in the HPI   ALLERGIES: No Known Allergies  HOME MEDICATIONS: Outpatient Medications Prior to Visit  Medication Sig Dispense Refill   amLODipine (NORVASC) 5 MG tablet Take 5 mg by mouth at bedtime.     aspirin 325 MG tablet Take 325 mg by mouth at bedtime.     atorvastatin (LIPITOR) 40 MG tablet Take 40 mg by mouth at bedtime.     Cholecalciferol (VITAMIN D3) 50 MCG (2000 UT) capsule Take 2,000 Units by mouth at bedtime.     citalopram (CELEXA) 20 MG tablet Take 20 mg by mouth at bedtime.     fluocinonide (LIDEX) 0.05 % external solution Apply 1 application. topically daily as needed (eczema).     fluticasone (FLONASE) 50 MCG/ACT nasal spray Place 1 spray into both nostrils daily as needed for congestion.     metoprolol succinate (TOPROL-XL) 50 MG 24 hr tablet Take 50 mg by mouth at  bedtime.     Multiple Vitamin (MULTIVITAMIN WITH MINERALS) TABS Take 1 tablet by mouth at bedtime.     galantamine (RAZADYNE ER) 8 MG 24 hr capsule Take 16 mg by mouth at bedtime.     memantine (NAMENDA) 10 MG tablet Take 1 tablet (10 mg total) by mouth 2 (two) times daily. 60 tablet 6   clopidogrel (PLAVIX) 75 MG tablet TAKE 1 TABLET(75 MG) BY MOUTH DAILY (Patient not taking: Reported on 03/28/2022) 30 tablet 0   No facility-administered medications prior to visit.    PAST MEDICAL HISTORY: Past Medical History:  Diagnosis Date   BPH (benign prostatic hyperplasia)    Colon polyps    Coronary  artery disease    Dyslipidemia    ED (erectile dysfunction)    Hydrocele    Hyperlipidemia    Hypertension    MI (myocardial infarction) ([place]) 1992   Nocturnal enuresis     PAST SURGICAL HISTORY: Past Surgical History:  Procedure Laterality Date   ABDOMINAL AORTOGRAM W/LOWER EXTREMITY Bilateral 08/19/2021   Procedure: ABDOMINAL AORTOGRAM W/LOWER EXTREMITY;  Surgeon: Cherre Robins, MD;  Location: [facility];  Service: Cardiovascular;  Laterality: Bilateral;   COLONOSCOPY W/ POLYPECTOMY     CORONARY ANGIOPLASTY     with stents   KNEE ARTHROSCOPY     PERIPHERAL VASCULAR INTERVENTION  08/19/2021   Procedure: PERIPHERAL VASCULAR INTERVENTION;  Surgeon: Cherre Robins, MD;  Location: [place];  Service: Cardiovascular;;  Right Iliac   RECTAL VILLOUS ADENOMA EXCISION     VASECTOMY      FAMILY HISTORY: History reviewed. No pertinent family history.  SOCIAL HISTORY: Social History   Socioeconomic History   Marital status: Married    Spouse name: Not on file   Number of children: Not on file   Years of education: Not on file   Highest education level: Not on file  Occupational History   Not on file  Tobacco Use   Smoking status: Former    Packs/day: 1.00    Years: 60.00    Total pack years: 60.00    Types: Cigarettes    Quit date: 08/17/1990    Years since quitting: 31.8    Passive exposure: Never   Smokeless tobacco: Current    Types: Snuff   Tobacco comments:    quit 1992  Substance and Sexual Activity   Alcohol use: Yes    Comment: occasionally   Drug use: No   Sexual activity: Not on file  Other Topics Concern   Not on file  Social History Narrative   Not on file   Social Determinants of Health   Financial Resource Strain: Not on file  Food Insecurity: Not on file  Transportation Needs: Not on file  Physical Activity: Not on file  Stress: Not on file  Social Connections: Not on file  Intimate Partner Violence: Not on file     PHYSICAL EXAM  GENERAL EXAM/CONSTITUTIONAL: Vitals:  Vitals:   05/29/22 1354 05/29/22 1406  BP: (!) 154/76 (!) 140/68  Pulse: (!) 56 (!) 55  Weight: 231 lb (104.8 kg)   Height: 6' (1.829 m)     Body mass index is 31.33 kg/m. Wt Readings from Last 3 Encounters:  05/29/22 231 lb (104.8 kg)  03/28/22 226 lb 4.8 oz (102.6 kg)  01/18/22 220 lb (99.8 kg)   Patient is in no distress; well developed, nourished and groomed; neck is supple   EYES: Visual fields full to confrontation, Extraocular  movements intacts,   MUSCULOSKELETAL: Gait, strength, tone, movements noted in Neurologic exam below  NEUROLOGIC: MENTAL STATUS:      No data to display            05/29/2022    2:00 PM 11/21/2021    2:19 PM  Montreal Cognitive Assessment   Visuospatial/ Executive (0/5) 5 4  Naming (0/3) 3 2  Attention: Read list of digits (0/2) 2 2  Attention: Read list of letters (0/1) 1 1  Attention: Serial 7 subtraction starting at 100 (0/3) 3 3  Language: Repeat phrase (0/2) 0 2  Language : Fluency (0/1) 0 0  Abstraction (0/2) 1 2  Delayed Recall (0/5) 4 0  Orientation (0/6) 1 2  Total 20 18  Adjusted Score (based on education)  19     CRANIAL NERVE:  2nd, 3rd, 4th, 6th - visual fields full to confrontation, extraocular muscles intact, no nystagmus 5th - facial sensation symmetric 7th - facial strength symmetric 8th - hearing intact 9th - palate elevates symmetrically, uvula midline 11th - shoulder shrug symmetric 12th - tongue protrusion midline  MOTOR:  normal bulk and tone, full strength in the BUE, BLE  SENSORY:  normal and symmetric to light touch, vibration  COORDINATION:  finger-nose-finger, fine finger movements normal  REFLEXES:  deep tendon reflexes present and symmetric  GAIT/STATION:  normal   DIAGNOSTIC DATA (LABS, IMAGING, TESTING) - I reviewed patient records, labs, notes, testing and imaging myself where available.  Lab Results  Component Value  Date   WBC 7.1 01/18/2022   HGB 15.4 01/18/2022   HCT 44.7 01/18/2022   MCV 89.4 01/18/2022   PLT 212 01/18/2022      Component Value Date/Time   NA 137 01/18/2022 1938   K 4.3 01/18/2022 1938   CL 102 01/18/2022 1938   CO2 28 01/18/2022 1938   GLUCOSE 115 (H) 01/18/2022 1938   BUN 17 01/18/2022 1938   CREATININE 1.16 01/18/2022 1938   CALCIUM 10.1 01/18/2022 1938   PROT 8.2 (H) 01/18/2022 1938   ALBUMIN 4.1 01/18/2022 1938   AST 30 01/18/2022 1938   ALT 26 01/18/2022 1938   ALKPHOS 134 (H) 01/18/2022 1938   BILITOT 1.3 (H) 01/18/2022 1938   GFRNONAA >60 01/18/2022 1938   GFRAA >60 06/09/2019 1705   No results found for: "CHOL", "HDL", "LDLCALC", "LDLDIRECT", "TRIG", "CHOLHDL" No results found for: "HGBA1C" Lab Results  Component Value Date   KCLEXNTZ00 174 11/21/2021   Lab Results  Component Value Date   TSH 4.490 11/21/2021      ASSESSMENT AND PLAN  87 y.o. year old male with history of dementia, hypertension, hyperlipidemia who is presenting for follow up for his dementia.  Since last visit in July we had added Namenda, he tolerated the medication very well and wife reported his memory is stable.  At this time, we will continue current medication but will also obtain the ATN profile to confirm the diagnosis of Alzheimer disease.  I will see him in 1 year for follow-up.  Advised wife to contact me if there is increased agitation, increased irritability, difficulty with sleep or hallucinations.  She voiced understanding.    1. Mild late onset Alzheimer's dementia without behavioral disturbance, psychotic disturbance, mood disturbance, or anxiety ([place])       Patient Instructions  Continue with Galantamine 16 mg daily  Continue with Namenda 10 mg twice daily  We will check ATN profile to look for Alzheimer Biomarker's  Follow up in  a year or sooner if worse   Orders Placed This Encounter  Procedures   ATN PROFILE    Meds ordered this encounter  Medications    memantine (NAMENDA) 10 MG tablet    Sig: Take 1 tablet (10 mg total) by mouth 2 (two) times daily.    Dispense:  180 tablet    Refill:  4   galantamine (RAZADYNE ER) 8 MG 24 hr capsule    Sig: Take 2 capsules (16 mg total) by mouth at bedtime.    Dispense:  180 capsule    Refill:  4    Return in about 1 year (around 05/30/2023).   I have spent a total of 45 minutes dedicated to this patient today, preparing to see patient, performing a medically appropriate examination and evaluation, ordering tests and/or medications and procedures, and counseling and educating the patient/family/caregiver; independently interpreting result and communicating results to the family/patient/caregiver; and documenting clinical information in the electronic medical record.   Alric Ran, MD 05/29/2022, 4:29 PM  [facility] [address] [place], [place] [address] [phone]

## 2022-06-01 LAB — ATN PROFILE
A -- Beta-amyloid 42/40 Ratio: 0.098 — ABNORMAL LOW (ref >0.102)
Beta-amyloid 40: 201.81 pg/mL
Beta-amyloid 42: 19.78 pg/mL
N -- NfL, Plasma: 4.33 pg/mL (ref 0.00–11.55)
T -- p-tau181: 2.08 pg/mL — ABNORMAL HIGH (ref 0.00–0.97)

## 2022-06-26 ENCOUNTER — Inpatient Hospital Stay (HOSPITAL_COMMUNITY)
Admission: EM | Admit: 2022-06-26 | Discharge: 2022-06-29 | DRG: 271 | Disposition: A | Discharge status: Home / Self Care | Payer: HMO | Attending: Vascular Surgery | Admitting: Vascular Surgery

## 2022-06-26 ENCOUNTER — Other Ambulatory Visit: Payer: Self-pay

## 2022-06-26 ENCOUNTER — Emergency Department (HOSPITAL_COMMUNITY): Payer: HMO

## 2022-06-26 ENCOUNTER — Inpatient Hospital Stay (HOSPITAL_COMMUNITY)
Admission: EM | Disposition: A | Discharge status: Home / Self Care | Payer: Self-pay | Source: Home / Self Care | Attending: Vascular Surgery

## 2022-06-26 DIAGNOSIS — I252 Old myocardial infarction: Secondary | ICD-10-CM

## 2022-06-26 DIAGNOSIS — Z7902 Long term (current) use of antithrombotics/antiplatelets: Secondary | ICD-10-CM

## 2022-06-26 DIAGNOSIS — I10 Essential (primary) hypertension: Secondary | ICD-10-CM

## 2022-06-26 DIAGNOSIS — Z87891 Personal history of nicotine dependence: Secondary | ICD-10-CM | POA: Diagnosis not present

## 2022-06-26 DIAGNOSIS — Z955 Presence of coronary angioplasty implant and graft: Secondary | ICD-10-CM | POA: Diagnosis not present

## 2022-06-26 DIAGNOSIS — F03911 Unspecified dementia, unspecified severity, with agitation: Secondary | ICD-10-CM | POA: Diagnosis present

## 2022-06-26 DIAGNOSIS — Y831 Surgical operation with implant of artificial internal device as the cause of abnormal reaction of the patient, or of later complication, without mention of misadventure at the time of the procedure: Secondary | ICD-10-CM | POA: Diagnosis present

## 2022-06-26 DIAGNOSIS — T82898A Other specified complication of vascular prosthetic devices, implants and grafts, initial encounter: Principal | ICD-10-CM | POA: Diagnosis present

## 2022-06-26 DIAGNOSIS — I2510 Atherosclerotic heart disease of native coronary artery without angina pectoris: Secondary | ICD-10-CM | POA: Diagnosis present

## 2022-06-26 DIAGNOSIS — N400 Enlarged prostate without lower urinary tract symptoms: Secondary | ICD-10-CM | POA: Diagnosis present

## 2022-06-26 DIAGNOSIS — Z79899 Other long term (current) drug therapy: Secondary | ICD-10-CM | POA: Diagnosis not present

## 2022-06-26 DIAGNOSIS — I70221 Atherosclerosis of native arteries of extremities with rest pain, right leg: Secondary | ICD-10-CM | POA: Diagnosis present

## 2022-06-26 DIAGNOSIS — E785 Hyperlipidemia, unspecified: Secondary | ICD-10-CM | POA: Diagnosis present

## 2022-06-26 DIAGNOSIS — Z7982 Long term (current) use of aspirin: Secondary | ICD-10-CM | POA: Diagnosis not present

## 2022-06-26 DIAGNOSIS — I998 Other disorder of circulatory system: Secondary | ICD-10-CM

## 2022-06-26 DIAGNOSIS — Z1152 Encounter for screening for COVID-19: Secondary | ICD-10-CM | POA: Diagnosis not present

## 2022-06-26 DIAGNOSIS — I743 Embolism and thrombosis of arteries of the lower extremities: Secondary | ICD-10-CM

## 2022-06-26 HISTORY — PX: PERIPHERAL VASCULAR THROMBECTOMY: CATH118306

## 2022-06-26 LAB — COMPREHENSIVE METABOLIC PANEL
ALT: 16 U/L (ref 0–44)
AST: 26 U/L (ref 15–41)
Albumin: 4 g/dL (ref 3.5–5.0)
Alkaline Phosphatase: 96 U/L (ref 38–126)
Anion gap: 16 — ABNORMAL HIGH (ref 5–15)
BUN: 16 mg/dL (ref 8–23)
CO2: 20 mmol/L — ABNORMAL LOW (ref 22–32)
Calcium: 10.6 mg/dL — ABNORMAL HIGH (ref 8.9–10.3)
Chloride: 103 mmol/L (ref 98–111)
Creatinine, Ser: 1.31 mg/dL — ABNORMAL HIGH (ref 0.61–1.24)
GFR, Estimated: 54 mL/min — ABNORMAL LOW (ref >60)
Glucose, Bld: 170 mg/dL — ABNORMAL HIGH (ref 70–99)
Potassium: 3.7 mmol/L (ref 3.5–5.1)
Sodium: 139 mmol/L (ref 135–145)
Total Bilirubin: 1.2 mg/dL (ref 0.3–1.2)
Total Protein: 7.5 g/dL (ref 6.5–8.1)

## 2022-06-26 LAB — CBC
HCT: 43.9 % (ref 39.0–52.0)
HCT: 37.8 % — ABNORMAL LOW (ref 39.0–52.0)
Hemoglobin: 15 g/dL (ref 13.0–17.0)
Hemoglobin: 13.1 g/dL (ref 13.0–17.0)
MCH: 30.8 pg (ref 26.0–34.0)
MCH: 31.6 pg (ref 26.0–34.0)
MCHC: 34.2 g/dL (ref 30.0–36.0)
MCHC: 34.7 g/dL (ref 30.0–36.0)
MCV: 90.1 fL (ref 80.0–100.0)
MCV: 91.3 fL (ref 80.0–100.0)
Platelets: 162 10*3/uL (ref 150–400)
Platelets: 150 10*3/uL (ref 150–400)
RBC: 4.87 MIL/uL (ref 4.22–5.81)
RBC: 4.14 MIL/uL — ABNORMAL LOW (ref 4.22–5.81)
RDW: 12.9 % (ref 11.5–15.5)
RDW: 13 % (ref 11.5–15.5)
WBC: 9.3 10*3/uL (ref 4.0–10.5)
WBC: 7.3 10*3/uL (ref 4.0–10.5)
nRBC: 0 % (ref 0.0–0.2)
nRBC: 0 % (ref 0.0–0.2)

## 2022-06-26 LAB — CBC WITH DIFFERENTIAL/PLATELET
Abs Immature Granulocytes: 0.05 10*3/uL (ref 0.00–0.07)
Basophils Absolute: 0 10*3/uL (ref 0.0–0.1)
Basophils Relative: 0 %
Eosinophils Absolute: 0 10*3/uL (ref 0.0–0.5)
Eosinophils Relative: 0 %
HCT: 44.8 % (ref 39.0–52.0)
Hemoglobin: 15.9 g/dL (ref 13.0–17.0)
Immature Granulocytes: 0 %
Lymphocytes Relative: 23 %
Lymphs Abs: 2.7 10*3/uL (ref 0.7–4.0)
MCH: 31.7 pg (ref 26.0–34.0)
MCHC: 35.5 g/dL (ref 30.0–36.0)
MCV: 89.4 fL (ref 80.0–100.0)
Monocytes Absolute: 0.8 10*3/uL (ref 0.1–1.0)
Monocytes Relative: 7 %
Neutro Abs: 8.3 10*3/uL — ABNORMAL HIGH (ref 1.7–7.7)
Neutrophils Relative %: 70 %
Platelets: 152 10*3/uL (ref 150–400)
RBC: 5.01 MIL/uL (ref 4.22–5.81)
RDW: 12.7 % (ref 11.5–15.5)
WBC: 12 10*3/uL — ABNORMAL HIGH (ref 4.0–10.5)
nRBC: 0 % (ref 0.0–0.2)

## 2022-06-26 LAB — TROPONIN I (HIGH SENSITIVITY)
Troponin I (High Sensitivity): 59 ng/L — ABNORMAL HIGH (ref <18)
Troponin I (High Sensitivity): 44 ng/L — ABNORMAL HIGH (ref <18)

## 2022-06-26 LAB — I-STAT CHEM 8, ED
BUN: 19 mg/dL (ref 8–23)
Calcium, Ion: 1.18 mmol/L (ref 1.15–1.40)
Chloride: 105 mmol/L (ref 98–111)
Creatinine, Ser: 1.2 mg/dL (ref 0.61–1.24)
Glucose, Bld: 165 mg/dL — ABNORMAL HIGH (ref 70–99)
HCT: 45 % (ref 39.0–52.0)
Hemoglobin: 15.3 g/dL (ref 13.0–17.0)
Potassium: 3.6 mmol/L (ref 3.5–5.1)
Sodium: 140 mmol/L (ref 135–145)
TCO2: 21 mmol/L — ABNORMAL LOW (ref 22–32)

## 2022-06-26 LAB — FIBRINOGEN
Fibrinogen: 506 mg/dL — ABNORMAL HIGH (ref 210–475)
Fibrinogen: 424 mg/dL (ref 210–475)

## 2022-06-26 LAB — TYPE AND SCREEN
ABO/RH(D): O POS
Antibody Screen: NEGATIVE

## 2022-06-26 LAB — ABO/RH: ABO/RH(D): O POS

## 2022-06-26 LAB — RESP PANEL BY RT-PCR (RSV, FLU A&B, COVID)  RVPGX2
Influenza A by PCR: NEGATIVE
Influenza B by PCR: NEGATIVE
Resp Syncytial Virus by PCR: NEGATIVE
SARS Coronavirus 2 by RT PCR: NEGATIVE

## 2022-06-26 LAB — PROTIME-INR
INR: 1 (ref 0.8–1.2)
Prothrombin Time: 12.9 seconds (ref 11.4–15.2)

## 2022-06-26 LAB — HEPARIN LEVEL (UNFRACTIONATED)
Heparin Unfractionated: 0.64 IU/mL (ref 0.30–0.70)
Heparin Unfractionated: >1.1 IU/mL — ABNORMAL HIGH (ref 0.30–0.70)
Heparin Unfractionated: 0.44 IU/mL (ref 0.30–0.70)

## 2022-06-26 LAB — GLUCOSE, CAPILLARY: Glucose-Capillary: 115 mg/dL — ABNORMAL HIGH (ref 70–99)

## 2022-06-26 SURGERY — PERIPHERAL VASCULAR THROMBECTOMY
Anesthesia: LOCAL

## 2022-06-26 MED ORDER — MIDAZOLAM HCL 2 MG/2ML IJ SOLN
INTRAMUSCULAR | Status: DC | PRN
  Administered 2022-06-26: 1 mg via INTRAVENOUS

## 2022-06-26 MED ORDER — MORPHINE SULFATE (PF) 4 MG/ML IV SOLN
5.0000 mg | INTRAVENOUS | Status: DC | PRN
  Administered 2022-06-26: 5 mg via INTRAVENOUS
  Administered 2022-06-27: 2 mg via INTRAVENOUS
  Administered 2022-06-28: 5 mg via INTRAVENOUS
  Filled 2022-06-26 (×2): qty 2

## 2022-06-26 MED ORDER — MIDAZOLAM HCL 2 MG/2ML IJ SOLN
INTRAMUSCULAR | Status: AC
  Filled 2022-06-26: qty 2

## 2022-06-26 MED ORDER — SODIUM CHLORIDE 0.9 % IV SOLN
1.0000 mg/h | INTRAVENOUS | Status: DC
  Administered 2022-06-26 – 2022-06-27 (×2): 1 mg/h
  Filled 2022-06-26 (×7): qty 10

## 2022-06-26 MED ORDER — HEPARIN BOLUS VIA INFUSION
4000.0000 [IU] | Freq: Once | INTRAVENOUS | Status: AC
  Administered 2022-06-26: 4000 [IU] via INTRAVENOUS
  Filled 2022-06-26: qty 4000

## 2022-06-26 MED ORDER — FENTANYL CITRATE (PF) 100 MCG/2ML IJ SOLN
INTRAMUSCULAR | Status: AC
  Filled 2022-06-26: qty 2

## 2022-06-26 MED ORDER — HYDROMORPHONE HCL 1 MG/ML IJ SOLN
0.5000 mg | Freq: Once | INTRAMUSCULAR | Status: AC
  Administered 2022-06-26: 0.5 mg via INTRAVENOUS
  Filled 2022-06-26: qty 1

## 2022-06-26 MED ORDER — LIDOCAINE HCL (PF) 1 % IJ SOLN
INTRAMUSCULAR | Status: AC
  Filled 2022-06-26: qty 30

## 2022-06-26 MED ORDER — ONDANSETRON HCL 4 MG/2ML IJ SOLN
4.0000 mg | Freq: Once | INTRAMUSCULAR | Status: AC
  Administered 2022-06-26: 4 mg via INTRAVENOUS
  Filled 2022-06-26: qty 2

## 2022-06-26 MED ORDER — SODIUM CHLORIDE 0.9 % IV SOLN: INTRAVENOUS | Status: DC

## 2022-06-26 MED ORDER — LIDOCAINE HCL (PF) 1 % IJ SOLN
INTRAMUSCULAR | Status: DC | PRN
  Administered 2022-06-26: 20 mL

## 2022-06-26 MED ORDER — IODIXANOL 320 MG/ML IV SOLN
INTRAVENOUS | Status: DC | PRN
  Administered 2022-06-26: 100 mL

## 2022-06-26 MED ORDER — HEPARIN (PORCINE) 25000 UT/250ML-% IV SOLN
1650.0000 [IU]/h | INTRAVENOUS | Status: DC
  Administered 2022-06-26: 1700 [IU]/h via INTRAVENOUS
  Filled 2022-06-26: qty 250

## 2022-06-26 MED ORDER — CHLORHEXIDINE GLUCONATE CLOTH 2 % EX PADS
6.0000 | MEDICATED_PAD | Freq: Every day | CUTANEOUS | Status: DC
  Administered 2022-06-26 – 2022-06-28 (×3): 6 via TOPICAL

## 2022-06-26 MED ORDER — SODIUM CHLORIDE 0.9% FLUSH: 3.0000 mL | INTRAVENOUS | Status: DC | PRN

## 2022-06-26 MED ORDER — IOHEXOL 350 MG/ML SOLN
100.0000 mL | Freq: Once | INTRAVENOUS | Status: AC | PRN
  Administered 2022-06-26: 100 mL via INTRAVENOUS

## 2022-06-26 MED ORDER — HYDRALAZINE HCL 20 MG/ML IJ SOLN
INTRAMUSCULAR | Status: AC
  Filled 2022-06-26: qty 1

## 2022-06-26 MED ORDER — HEPARIN (PORCINE) IN NACL 1000-0.9 UT/500ML-% IV SOLN
INTRAVENOUS | Status: DC | PRN
  Administered 2022-06-26 (×2): 500 mL

## 2022-06-26 MED ORDER — METOPROLOL TARTRATE 5 MG/5ML IV SOLN
5.0000 mg | Freq: Four times a day (QID) | INTRAVENOUS | Status: DC
  Administered 2022-06-26 – 2022-06-28 (×6): 5 mg via INTRAVENOUS
  Filled 2022-06-26 (×6): qty 5

## 2022-06-26 MED ORDER — ASPIRIN 81 MG PO CHEW
CHEWABLE_TABLET | ORAL | Status: AC
  Filled 2022-06-26: qty 1

## 2022-06-26 MED ORDER — MORPHINE SULFATE (PF) 4 MG/ML IV SOLN
4.0000 mg | Freq: Once | INTRAVENOUS | Status: AC
  Administered 2022-06-26: 4 mg via INTRAVENOUS
  Filled 2022-06-26: qty 1

## 2022-06-26 MED ORDER — HEPARIN SODIUM (PORCINE) 1000 UNIT/ML IJ SOLN
INTRAMUSCULAR | Status: AC
  Filled 2022-06-26: qty 10

## 2022-06-26 MED ORDER — SODIUM CHLORIDE 0.9 % IV SOLN: 250.0000 mL | INTRAVENOUS | Status: DC | PRN

## 2022-06-26 MED ORDER — ONDANSETRON HCL 4 MG/2ML IJ SOLN: 4.0000 mg | Freq: Four times a day (QID) | INTRAMUSCULAR | Status: DC | PRN

## 2022-06-26 MED ORDER — HEPARIN (PORCINE) 25000 UT/250ML-% IV SOLN
800.0000 [IU]/h | INTRAVENOUS | Status: DC
  Administered 2022-06-26 (×2): 800 [IU]/h via INTRAVENOUS
  Filled 2022-06-26: qty 250

## 2022-06-26 MED ORDER — SODIUM CHLORIDE 0.9% FLUSH
3.0000 mL | Freq: Two times a day (BID) | INTRAVENOUS | Status: DC
  Administered 2022-06-26 – 2022-06-28 (×5): 3 mL via INTRAVENOUS

## 2022-06-26 MED ORDER — LABETALOL HCL 5 MG/ML IV SOLN
INTRAVENOUS | Status: AC
  Filled 2022-06-26: qty 4

## 2022-06-26 MED ORDER — ASPIRIN 81 MG PO CHEW
CHEWABLE_TABLET | ORAL | Status: DC | PRN
  Administered 2022-06-26: 81 mg via ORAL

## 2022-06-26 MED ORDER — METOPROLOL TARTRATE 5 MG/5ML IV SOLN: 2.0000 mg | INTRAVENOUS | Status: DC | PRN

## 2022-06-26 MED ORDER — FENTANYL CITRATE (PF) 100 MCG/2ML IJ SOLN
INTRAMUSCULAR | Status: DC | PRN
  Administered 2022-06-26 (×2): 50 ug via INTRAVENOUS

## 2022-06-26 MED ORDER — HYDRALAZINE HCL 20 MG/ML IJ SOLN
INTRAMUSCULAR | Status: DC | PRN
  Administered 2022-06-26 (×2): 10 mg via INTRAVENOUS

## 2022-06-26 MED ORDER — MIDAZOLAM HCL 2 MG/2ML IJ SOLN
1.0000 mg | INTRAMUSCULAR | Status: DC | PRN
  Administered 2022-06-27: 1 mg via INTRAVENOUS
  Filled 2022-06-26: qty 2

## 2022-06-26 SURGICAL SUPPLY — 16 items
CATH ANGIO 5F PIGTAIL 65CM (CATHETERS) IMPLANT
CATH INFUS 135CMX50CM (CATHETERS) IMPLANT
CATH OMNI FLUSH 5F 65CM (CATHETERS) IMPLANT
CATH PULSE SPRAY 45CMX15CM 5FR (CATHETERS) IMPLANT
GLIDEWIRE ADV .035X260CM (WIRE) IMPLANT
KIT MICROPUNCTURE NIT STIFF (SHEATH) IMPLANT
KIT PV (KITS) IMPLANT
SHEATH BRITE TIP 6FR 35CM (SHEATH) IMPLANT
SHEATH PINNACLE 5F 10CM (SHEATH) IMPLANT
SHEATH PROBE COVER 6X72 (BAG) IMPLANT
STOPCOCK MORSE 400PSI 3WAY (MISCELLANEOUS) IMPLANT
SYR MEDRAD MARK 7 150ML (SYRINGE) IMPLANT
TRANSDUCER W/MONITORING KIT (MISCELLANEOUS) IMPLANT
TRAY PV CATH (CUSTOM PROCEDURE TRAY) IMPLANT
TUBING CIL FLEX 10 FLL-RA (TUBING) IMPLANT
WIRE BENTSON .035X145CM (WIRE) IMPLANT

## 2022-06-26 NOTE — Progress Notes (Signed)
ANTICOAGULATION CONSULT NOTE - Initial Consult  Pharmacy Consult for Heparin Indication: Lower extremity arterial occulusion   No Known Allergies  Patient Measurements: Height: 6' (182.9 cm) Weight: 104.3 kg (230 lb) IBW/kg (Calculated) : 77.6 Heparin Dosing Weight: 99 Kg  Vital Signs: Temp: 98.3 F (36.8 C) (03/04 1000) Temp Source: Oral (03/04 1000) BP: 156/82 (03/04 1000) Pulse Rate: 97 (03/04 1000)  Labs: Recent Labs    06/26/22 0135 06/26/22 0139 06/26/22 0349 06/26/22 1000  HGB 15.9 15.3  --   --   HCT 44.8 45.0  --   --   PLT 152  --   --   --   LABPROT 12.9  --   --   --   INR 1.0  --   --   --   HEPARINUNFRC  --   --   --  0.64  CREATININE 1.31* 1.20  --   --   TROPONINIHS 59*  --  44*  --      Estimated Creatinine Clearance: 57.2 mL/min (by C-G formula based on SCr of 1.2 mg/dL).   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 Patient is known to the [facility] service with PMH of PAD. No oral anticoagulation reported prior to admission. Pharmacy has been consulted to dose heparin.  Heparin level 0.64 units/mL (therapeutic) on heparin 1700 units/hr. CBC stable with no signs of bleeding.  Goal of Therapy:  Heparin level 0.3-0.7 units/ml Monitor platelets by anticoagulation protocol: Yes   Plan:  Slightly reduce heparin to 1650 units/hr to avoid accumulation Check heparin level at 2000 F/u vascular procedure and lytic plans  Erskine Speed, PharmD Clinical Pharmacist 06/26/2022 11:08 AM

## 2022-06-26 NOTE — H&P (Signed)
H&P    Reason for Consult:  Ischemic right leg Referring Physician:  ED MRN #:  [id number]  History of Present Illness: This is a 87 y.o. male with history of hypertension, hyperlipidemia, previous MI that vascular surgery has been consulted for ischemic right leg.  Patient was getting out of his recliner and developed acute severe pain and discoloration in his right lower extremity.  He is well-known to vascular surgery and had a right common and external iliac stenting by Dr. Stanford Breed on 08/19/2021.  States Dr. Stanford Breed stopped his Plavix but he is taking aspirin.  Able to move his foot.  Some decrease sensation.  Past Medical History:  Diagnosis Date   BPH (benign prostatic hyperplasia)    Colon polyps    Coronary artery disease    Dyslipidemia    ED (erectile dysfunction)    Hydrocele    Hyperlipidemia    Hypertension    MI (myocardial infarction) ([place]) 1992   Nocturnal enuresis     Past Surgical History:  Procedure Laterality Date   ABDOMINAL AORTOGRAM W/LOWER EXTREMITY Bilateral 08/19/2021   Procedure: ABDOMINAL AORTOGRAM W/LOWER EXTREMITY;  Surgeon: Cherre Robins, MD;  Location: [facility];  Service: Cardiovascular;  Laterality: Bilateral;   COLONOSCOPY W/ POLYPECTOMY     CORONARY ANGIOPLASTY     with stents   KNEE ARTHROSCOPY     PERIPHERAL VASCULAR INTERVENTION  08/19/2021   Procedure: PERIPHERAL VASCULAR INTERVENTION;  Surgeon: Cherre Robins, MD;  Location: [facility];  Service: Cardiovascular;;  Right Iliac   RECTAL VILLOUS ADENOMA EXCISION     VASECTOMY      No Known Allergies  Prior to Admission medications   Medication Sig Start Date End Date Taking? Authorizing Provider  amLODipine (NORVASC) 5 MG tablet Take 5 mg by mouth daily. 09/27/20  Yes [provider]  aspirin 325 MG tablet Take 325 mg by mouth daily.   Yes [provider]  atorvastatin (LIPITOR) 40 MG tablet Take 40 mg by mouth daily. 09/27/20  Yes [provider]  Cholecalciferol (VITAMIN D3) 50 MCG (2000 UT) capsule Take 2,000 Units by mouth at bedtime.   Yes [provider]  citalopram (CELEXA) 20 MG tablet Take 20 mg by mouth daily. 09/27/20  Yes [provider]  fluocinonide (LIDEX) 0.05 % external solution Apply 1 application. topically daily as needed (eczema).   Yes [provider]  fluticasone (FLONASE) 50 MCG/ACT nasal spray Place 1 spray into both nostrils daily as needed for congestion. 09/17/14  Yes [provider]  galantamine (RAZADYNE ER) 8 MG 24 hr capsule Take 2 capsules (16 mg total) by mouth at bedtime. Patient taking differently: Take 16 mg by mouth daily at 12 noon. 05/29/22 08/22/23 Yes Camara, Maryan Puls, MD  memantine (NAMENDA) 10 MG tablet Take 1 tablet (10 mg total) by mouth 2 (two) times daily. 05/29/22 08/22/23 Yes Camara, Maryan Puls, MD  metoprolol succinate (TOPROL-XL) 50 MG 24 hr tablet Take 50 mg by mouth daily. 09/27/20  Yes [provider]  Multiple Vitamin (MULTIVITAMIN WITH MINERALS) TABS Take 1 tablet by mouth at bedtime.   Yes [provider]  clopidogrel (PLAVIX) 75 MG tablet TAKE 1 TABLET(75 MG) BY MOUTH DAILY Patient not taking: Reported on 03/28/2022 09/30/21   Waynetta Sandy, MD    Social History   Socioeconomic History   Marital status: Married    Spouse name: Not on file   Number of children: Not on file  Years of education: Not on file   Highest education level: Not on file  Occupational History   Not on file  Tobacco Use   Smoking status: Former    Packs/day: 1.00    Years: 60.00    Total pack years: 60.00    Types: Cigarettes    Quit date: 08/17/1990    Years since quitting: 31.8    Passive exposure: Never   Smokeless tobacco: Current    Types: Snuff   Tobacco comments:    quit 1992  Substance and Sexual Activity   Alcohol use: Yes    Comment: occasionally   Drug use: No   Sexual activity: Not on file  Other Topics Concern   Not on file   Social History Narrative   Not on file   Social Determinants of Health   Financial Resource Strain: Not on file  Food Insecurity: Not on file  Transportation Needs: Not on file  Physical Activity: Not on file  Stress: Not on file  Social Connections: Not on file  Intimate Partner Violence: Not on file     No family history on file.  ROS: '[x]'$  Positive   '[ ]'$  Negative   '[ ]'$  All sytems reviewed and are negative  Cardiovascular: '[]'$  chest pain/pressure '[]'$  palpitations '[]'$  SOB lying flat '[]'$  DOE '[]'$  pain in legs while walking '[x]'$  pain in legs at rest - right foot '[]'$  pain in legs at night '[]'$  non-healing ulcers '[]'$  hx of DVT '[]'$  swelling in legs  Pulmonary: '[]'$  productive cough '[]'$  asthma/wheezing '[]'$  home O2  Neurologic: '[]'$  weakness in '[]'$  arms '[]'$  legs '[]'$  numbness in '[]'$  arms '[]'$  legs '[]'$  hx of CVA '[]'$  mini stroke '[]'$ difficulty speaking or slurred speech '[]'$  temporary loss of vision in one eye '[]'$  dizziness  Hematologic: '[]'$  hx of cancer '[]'$  bleeding problems '[]'$  problems with blood clotting easily  Endocrine:   '[]'$  diabetes '[]'$  thyroid disease  GI '[]'$  vomiting blood '[]'$  blood in stool  GU: '[]'$  CKD/renal failure '[]'$  HD--'[]'$  M/W/F or '[]'$  T/T/S '[]'$  burning with urination '[]'$  blood in urine  Psychiatric: '[]'$  anxiety '[]'$  depression  Musculoskeletal: '[]'$  arthritis '[]'$  joint pain  Integumentary: '[]'$  rashes '[]'$  ulcers  Constitutional: '[]'$  fever '[]'$  chills   Physical Examination  Vitals:   06/26/22 0201 06/26/22 0230  BP:  (!) 183/100  Pulse:  88  Resp:  (!) 24  Temp: 97.7 F (36.5 C)   SpO2:  100%   Body mass index is 31.19 kg/m.  General:  NAD Gait: Not observed HENT: WNL, normocephalic Pulmonary: normal non-labored breathing Cardiac: regular, without  Murmurs, rubs or gallops Abdomen:  soft, NT/ND Vascular Exam/Pulses: Left femoral pulse palpable Right femoral pulse nonpalpable Left DP palpable Right PT monophasic by Doppler Extremities: Right foot cool but able  to wiggle toes and plantar and dorsiflex with some decrease sensation Musculoskeletal: no muscle wasting or atrophy  Neurologic: A&O X 3; Appropriate Affect ; SENSATION: normal; MOTOR FUNCTION:  moving all extremities equally. Speech is fluent/normal   CBC    Component Value Date/Time   WBC 12.0 (H) 06/26/2022 0135   RBC 5.01 06/26/2022 0135   HGB 15.3 06/26/2022 0139   HCT 45.0 06/26/2022 0139   PLT 152 06/26/2022 0135   MCV 89.4 06/26/2022 0135   MCH 31.7 06/26/2022 0135   MCHC 35.5 06/26/2022 0135   RDW 12.7 06/26/2022 0135   LYMPHSABS 2.7 06/26/2022 0135   MONOABS 0.8 06/26/2022 0135   EOSABS 0.0 06/26/2022 0135  BASOSABS 0.0 06/26/2022 0135    BMET    Component Value Date/Time   NA 140 06/26/2022 0139   K 3.6 06/26/2022 0139   CL 105 06/26/2022 0139   CO2 20 (L) 06/26/2022 0135   GLUCOSE 165 (H) 06/26/2022 0139   BUN 19 06/26/2022 0139   CREATININE 1.20 06/26/2022 0139   CALCIUM 10.6 (H) 06/26/2022 0135   GFRNONAA 54 (L) 06/26/2022 0135   GFRAA >60 06/09/2019 1705    COAGS: Lab Results  Component Value Date   INR 1.0 06/26/2022     Non-Invasive Vascular Imaging:     CTA reviewed today and the right common and external iliac artery including the stents are occluded.  He reconstitutes common femoral and profunda.  Distally the SFA popliteal artery appears occluded and he reconstitutes a posterior tibial in the calf.  ASSESSMENT/PLAN: This is a 87 y.o. with history of hypertension, hyperlipidemia, previous MI that vascular surgery has been consulted for ischemic right leg.  Patient was getting out of his recliner and developed acute severe pain and discoloration in his right lower extremity around 11 pm last night.  He is well-known to vascular surgery and had a right common and external iliac stenting by Dr. Stanford Breed on 08/19/2021 for claudication.   Discussed that his right common and external iliac artery are occluded including his previous stents.  He does  reconstitute his common femoral and profunda.  He also appears to have occluded SFA popliteal artery with a posterior tibial that is reconstituted distally.  I have recommended admission for thrombolysis of the right lower extremity including his previous stents  He does have a monophasic PT signal at the ankle.  His right foot appears motor intact.  I discussed risk and benefits including risk of hemorrhage including retroperitoneal bleed bleed and intracranial bleed and vessel injury.  Please keep NPO.  Continue heparin.  Will need to go to the ICU after his procedure today.  Marty Heck, MD [facility] Office: [place]

## 2022-06-26 NOTE — ED Notes (Signed)
Report received from Night RN assumed care at this time pt resting on stretcher NAD will continue to monitor

## 2022-06-26 NOTE — TOC Initial Note (Signed)
Transition of Care [facility]) - Initial/Assessment Note    Patient Details  Name: Timothy Arias MRN: [id number] Date of Birth: June 01, 1935  Transition of Care [facility]) CM/SW Contact:    Erenest Rasher, RN Phone Number: [phone] 06/26/2022, 4:49 PM  Clinical Narrative:                 CM spoke to pt and wife at bedside. Pt was having problems with walking. Will need PT/OT evaluation. Pt has RW and canes at home.   Expected Discharge Plan: Skilled Nursing Facility Barriers to Discharge: Continued Medical Work up   Patient Goals and CMS Choice Patient states their goals for this hospitalization and ongoing recovery are:: wants to recover and be able to walk again CMS Medicare.gov Compare Post Acute Care list provided to:: Patient        Expected Discharge Plan and Services   Discharge Planning Services: CM Consult   Living arrangements for the past 2 months: Single Family Home                                      Prior Living Arrangements/Services Living arrangements for the past 2 months: Single Family Home Lives with:: Spouse Patient language and need for interpreter reviewed:: Yes Do you feel safe going back to the place where you live?: Yes      Need for Family Participation in Patient Care: Yes (Comment) Care giver support system in place?: Yes (comment)   Criminal Activity/Legal Involvement Pertinent to Current Situation/Hospitalization: No - Comment as needed  Activities of Daily Living      Permission Sought/Granted Permission sought to share information with : Case Manager, Family Supports, PCP Permission granted to share information with : Yes, Verbal Permission Granted  Share Information with NAME: Timothy Arias     Permission granted to share info w Relationship: wife  Permission granted to share info w Contact Information: [phone]  Emotional Assessment Appearance:: Appears stated age Attitude/Demeanor/Rapport: Engaged Affect (typically  observed): Accepting Orientation: : Oriented to Self, Oriented to Place, Oriented to  Time, Oriented to Situation   Psych Involvement: No (comment)  Admission diagnosis:  Ischemic leg [I99.8] Acute lower limb ischemia [I99.8] Patient Active Problem List   Diagnosis Date Noted   Acute lower limb ischemia 06/26/2022   Osteoarthritis of knee 08/29/2018   Trochanteric bursitis of left hip 08/29/2018   Ischemic heart disease 01/23/2014   Hypercholesterolemia 01/23/2014   Essential hypertension 01/23/2014   Paresthesias/numbness 01/23/2014   Osteoarthritis of both knees 01/23/2014   Dyspnea on exertion 01/23/2014   PCP:  Seward Carol, MD Pharmacy:   [id number] [id number]: [phone]: [phone]    Social Determinants of Health (SDOH) Social History: SDOH Screenings   Tobacco Use: High Risk (05/29/2022)   SDOH Interventions:     Readmission Risk Interventions     No data to display

## 2022-06-26 NOTE — ED Notes (Signed)
Pt resting on stretcher with monitor.  Heparin gtt infusing to R AC without difficulty.  Awake, alert, and conversing appropriately at this time.  Resp even unlabored. C/O pain to R leg "7"/10 burning ache.  Leg is cool to touch and pallor to toes and sole of foot.

## 2022-06-26 NOTE — Progress Notes (Signed)
ANTICOAGULATION CONSULT NOTE - Initial Consult  Pharmacy Consult for Heparin Indication: Lower extremity arterial occulusion   No Known Allergies  Patient Measurements: Height: 6' (182.9 cm) Weight: 104.8 kg (231 lb) IBW/kg (Calculated) : 77.6 Heparin Dosing Weight: 99 Kg  Vital Signs: BP: 186/104 (03/04 0136) Pulse Rate: 90 (03/04 0137)  Labs: Recent Labs    06/26/22 0135 06/26/22 0139  HGB 15.9 15.3  HCT 44.8 45.0  PLT 152  --   CREATININE  --  1.20    Estimated Creatinine Clearance: 57.4 mL/min (by C-G formula based on SCr of 1.2 mg/dL).   Medical History: Past Medical History:  Diagnosis Date   BPH (benign prostatic hyperplasia)    Colon polyps    Coronary artery disease    Dyslipidemia    ED (erectile dysfunction)    Hydrocele    Hyperlipidemia    Hypertension    MI (myocardial infarction) ([place]) 1992   Nocturnal enuresis      Assessment: 57 yoM presenting to the ED found to have an arterial occlusion in in leg. Patient is known to the [facility] service with PMH of PAD. No oral anticoagulation     reported prior to admission. Pharmacy has been consulted to dose heparin. CBC    WNL   Goal of Therapy:  Heparin level 0.3-0.7 units/ml Monitor platelets by anticoagulation protocol: Yes   Plan:  Give 4000 units bolus x 1 Start heparin infusion at 1700 units/hr Check anti-Xa level in 8 hours and daily while on heparin Continue to monitor H&H and platelets  Georga Bora, PharmD Clinical Pharmacist 06/26/2022 2:07 AM Please check AMION for all [facility] numbers

## 2022-06-26 NOTE — ED Provider Notes (Signed)
[facility] Provider Note   CSN: [id number] Arrival date & time: 06/26/22  0132     History  Chief Complaint  Patient presents with   Leg Pain    Timothy Arias is a 87 y.o. male.  87 year old male brought in by EMS from home, called at 1240am with severe pain in the right leg. EMS felt had a pulse on arrival, loss DP pulse in route to the ER, right foot pale and cool compared to left. Now complaining of right groin pain. History of right common and external iliac arterial stent on 08/19/21 with Dr. Stanford Breed. ABI on 03/28/22 with biphasic flow to both feet.        Home Medications Prior to Admission medications   Medication Sig Start Date End Date Taking? Authorizing Provider  amLODipine (NORVASC) 5 MG tablet Take 5 mg by mouth daily. 09/27/20  Yes [provider]  aspirin 325 MG tablet Take 325 mg by mouth daily.   Yes [provider]  atorvastatin (LIPITOR) 40 MG tablet Take 40 mg by mouth daily. 09/27/20  Yes [provider]  Cholecalciferol (VITAMIN D3) 50 MCG (2000 UT) capsule Take 2,000 Units by mouth at bedtime.   Yes [provider]  citalopram (CELEXA) 20 MG tablet Take 20 mg by mouth daily. 09/27/20  Yes [provider]  fluocinonide (LIDEX) 0.05 % external solution Apply 1 application. topically daily as needed (eczema).   Yes [provider]  fluticasone (FLONASE) 50 MCG/ACT nasal spray Place 1 spray into both nostrils daily as needed for congestion. 09/17/14  Yes [provider]  galantamine (RAZADYNE ER) 8 MG 24 hr capsule Take 2 capsules (16 mg total) by mouth at bedtime. Patient taking differently: Take 16 mg by mouth daily at 12 noon. 05/29/22 08/22/23 Yes Camara, Maryan Puls, MD  memantine (NAMENDA) 10 MG tablet Take 1 tablet (10 mg total) by mouth 2 (two) times daily. 05/29/22 08/22/23 Yes Camara, Maryan Puls, MD  metoprolol succinate (TOPROL-XL) 50 MG 24 hr tablet Take 50 mg by  mouth daily. 09/27/20  Yes [provider]  Multiple Vitamin (MULTIVITAMIN WITH MINERALS) TABS Take 1 tablet by mouth at bedtime.   Yes [provider]  clopidogrel (PLAVIX) 75 MG tablet TAKE 1 TABLET(75 MG) BY MOUTH DAILY Patient not taking: Reported on 03/28/2022 09/30/21   Waynetta Sandy, MD      Allergies    Patient has no known allergies.    Review of Systems   Review of Systems Level 5 caveat for dementia  Physical Exam Updated Vital Signs BP (!) 188/92   Pulse 88   Temp 98.1 F (36.7 C) (Oral)   Resp 15   Ht 6' (1.829 m)   Wt 104.3 kg   SpO2 96%   BMI 31.19 kg/m  Physical Exam Vitals and nursing note reviewed.  Constitutional:      General: He is not in acute distress.    Appearance: He is well-developed. He is not diaphoretic.     Comments: Appears to be in pain  HENT:     Head: Normocephalic and atraumatic.  Pulmonary:     Effort: Pulmonary effort is normal.  Musculoskeletal:        General: Tenderness present.  Skin:    Coloration: Skin is pale.     Comments: Right lower leg/foot white/pale, left lower leg pale. DP, PT, popliteal absent with doppler. Left DP present by doppler (marked)  Neurological:  Mental Status: He is alert and oriented to person, place, and time.  Psychiatric:        Behavior: Behavior normal.     ED Results / Procedures / Treatments   Labs (all labs ordered are listed, but only abnormal results are displayed) Labs Reviewed  CBC WITH DIFFERENTIAL/PLATELET - Abnormal; Notable for the following components:      Result Value   WBC 12.0 (*)    Neutro Abs 8.3 (*)    All other components within normal limits  COMPREHENSIVE METABOLIC PANEL - Abnormal; Notable for the following components:   CO2 20 (*)    Glucose, Bld 170 (*)    Creatinine, Ser 1.31 (*)    Calcium 10.6 (*)    GFR, Estimated 54 (*)    Anion gap 16 (*)    All other components within normal limits  I-STAT CHEM 8, ED - Abnormal; Notable for  the following components:   Glucose, Bld 165 (*)    TCO2 21 (*)    All other components within normal limits  TROPONIN I (HIGH SENSITIVITY) - Abnormal; Notable for the following components:   Troponin I (High Sensitivity) 59 (*)    All other components within normal limits  TROPONIN I (HIGH SENSITIVITY) - Abnormal; Notable for the following components:   Troponin I (High Sensitivity) 44 (*)    All other components within normal limits  RESP PANEL BY RT-PCR (RSV, FLU A&B, COVID)  RVPGX2  PROTIME-INR  HEPARIN LEVEL (UNFRACTIONATED)  TYPE AND SCREEN  ABO/RH    EKG None  Radiology CT Angio Aortobifemoral W and/or Wo Contrast  Result Date: 06/26/2022 CLINICAL DATA:  Peripheral artery disease, asymptomatic. EXAM: CT ANGIOGRAPHY OF ABDOMINAL AORTA WITH ILIOFEMORAL RUNOFF TECHNIQUE: Multidetector CT imaging of the abdomen, pelvis and lower extremities was performed using the standard protocol during bolus administration of intravenous contrast. Multiplanar CT image reconstructions and MIPs were obtained to evaluate the vascular anatomy. RADIATION DOSE REDUCTION: This exam was performed according to the departmental dose-optimization program which includes automated exposure control, adjustment of the mA and/or kV according to patient size and/or use of iterative reconstruction technique. CONTRAST:  114m OMNIPAQUE IOHEXOL 350 MG/ML SOLN COMPARISON:  08/12/2021. FINDINGS: VASCULAR Aorta: Normal caliber aorta without aneurysm, dissection, vasculitis or significant stenosis. Aortic atherosclerosis with mural thrombus. Celiac: Patent without evidence of aneurysm, dissection, vasculitis or significant stenosis. Mild atherosclerotic changes at the origin. SMA: Patent without evidence of aneurysm, dissection, vasculitis or significant stenosis. Renals: Atherosclerotic calcification with soft plaque at the renal ostia bilaterally with high-grade stenosis bilaterally. An accessory renal artery is noted on the  left. IMA: Patent without evidence of aneurysm, dissection, vasculitis or significant stenosis. RIGHT Lower Extremity Inflow: There is a common iliac stent on the right which is occluded. The external iliac artery on the right is occluded with reconstitution at the level of the common femoral. The external iliac artery is patent. Outflow: There is reconstitution of the common femoral artery. The proximal superficial femoral and profunda artery are patent. There is atherosclerotic calcification and mural thrombus with multifocal high-grade stenosis of the mid superficial femoral artery and complete occlusion of the distal superficial femoral and popliteal arteries. Runoff: Atherosclerotic calcification. The three-vessel superior occluded on the right. LEFT Lower Extremity Inflow: Common, internal and external iliac arteries are patent without evidence of aneurysm, dissection, vasculitis or significant stenosis. Mild atherosclerotic changes. Outflow: The common femoral, superficial and profunda femoral arteries are patent with mild atherosclerotic calcification. There is multifocal high-grade stenosis  of the popliteal artery. Runoff: Atherosclerotic calcification. Patent three vessel runoff to the ankle. Veins: Review of the MIP images confirms the above findings. NON-VASCULAR Lower chest: Excluded from the field of view. Hepatobiliary: No focal liver abnormality is seen. No gallstones, gallbladder wall thickening, or biliary dilatation. Pancreas: Unremarkable. No pancreatic ductal dilatation or surrounding inflammatory changes. Spleen: Normal in size without focal abnormality. Adrenals/Urinary Tract: The adrenal glands are within normal limits. The kidneys enhance symmetrically. No renal calculus or hydronephrosis. The bladder is unremarkable. Stomach/Bowel: Stomach is within normal limits. Appendix appears normal. No evidence of bowel wall thickening, distention, or inflammatory changes. No free air or pneumatosis.  A few scattered diverticula are present along the colon without evidence of diverticulitis. Lymphatic: No abdominal or pelvic lymphadenopathy. Reproductive: The prostate gland is enlarged measuring 6.5 cm. Other: No abdominopelvic ascites. Fat containing inguinal hernias are present bilaterally. Musculoskeletal: Degenerative changes are present in the lumbar spine and bilateral hips. No acute osseous abnormality. IMPRESSION: VASCULAR 1. Common iliac artery stent on the right with complete occlusion of the mid to distal stent and internal iliac artery on the right. 2. Complete occlusion of the distal superficial femoral, popliteal, posterior tibial, anterior tibial, and peroneal arteries on the right. 3. Multifocal high-grade stenosis involving the mid superficial femoral artery on the right. 4. Multifocal high-grade stenosis involving the popliteal artery on the left. 5. High-grade stenosis at the renal ostia bilaterally. NON-VASCULAR 1. No acute intra-abdominal process. 2. Enlarged prostate gland. Electronically Signed   By: Brett Fairy M.D.   On: 06/26/2022 02:46    Procedures .Critical Care  Performed by: Tacy Learn, PA-C Authorized by: Tacy Learn, PA-C   Critical care provider statement:    Critical care time (minutes):  30   Critical care was time spent personally by me on the following activities:  Development of treatment plan with patient or surrogate, discussions with consultants, evaluation of patient's response to treatment, examination of patient, ordering and review of laboratory studies, ordering and review of radiographic studies, ordering and performing treatments and interventions, pulse oximetry, re-evaluation of patient's condition and review of old charts     Medications Ordered in ED Medications  heparin ADULT infusion 100 units/mL (25000 units/260m) (1,700 Units/hr Intravenous New Bag/Given 06/26/22 0225)  heparin bolus via infusion 4,000 Units (4,000 Units  Intravenous Bolus from Bag 06/26/22 0224)  ondansetron (ZOFRAN) injection 4 mg (4 mg Intravenous Given 06/26/22 0223)  HYDROmorphone (DILAUDID) injection 0.5 mg (0.5 mg Intravenous Given 06/26/22 0223)  iohexol (OMNIPAQUE) 350 MG/ML injection 100 mL (100 mLs Intravenous Contrast Given 06/26/22 0223)  morphine (PF) 4 MG/ML injection 4 mg (4 mg Intravenous Given 06/26/22 0306)  morphine (PF) 4 MG/ML injection 4 mg (4 mg Intravenous Given 06/26/22 0420)    ED Course/ Medical Decision Making/ A&P                             Medical Decision Making Amount and/or Complexity of Data Reviewed Labs: ordered. Radiology: ordered.  Risk Prescription drug management. Decision regarding hospitalization.   This patient presents to the ED for concern of sudden onset severe right leg pain, this involves an extensive number of treatment options, and is a complaint that carries with it a high risk of complications and morbidity.  The differential diagnosis includes but not limited to art occlusion, dissection   Co morbidities that complicate the patient evaluation  HTN, PAD, MI, hyperlipidemia,  Right common  and external iliac arterial stent placed 08/19/21 with Dr. Stanford Breed   Additional history obtained:  Additional history obtained from EMS who contributes to history as above External records from outside source obtained and reviewed including ABI 03/28/22 with biphasic flow to right and lower extremities    Lab Tests:  I Ordered, and personally interpreted labs.  The pertinent results include: COVID/flu negative.  CBC with mild extensive white count of 12.0.  CMP with mildly elevated creatinine at 1.31, GFR 54.  INR within normal limits.  Troponin elevated at 59 and 44 on repeat   Imaging Studies ordered:  I ordered imaging studies including CTA lower extremities   I independently visualized and interpreted imaging which showed occlusion right lower extremity at the right femoral artery  I agree with  the radiologist interpretation   Cardiac Monitoring: / EKG:  The patient was maintained on a cardiac monitor.  I personally viewed and interpreted the cardiac monitored which showed an underlying rhythm of: Sinus tach, rate 100   Consultations Obtained:  I requested consultation with the vascular surgery, Dr. Carlis Abbott,  and discussed lab and imaging findings as well as pertinent plan - they recommend: heparin, CTA lower ext. as planned   Problem List / ED Course / Critical interventions / Medication management  87 year old male presents with complaint of right lower leg pain, sudden onset tonight with history of prior right side stent.  Arrives in the ER visibly uncomfortable, holding his right groin and right leg is cold to the touch, white compared to left.  Unable to Doppler DP, PT, popliteal pulses, unable to palpate femoral pulse.  Sensation intact.  Left DP pulse, marked.  Vascular surgery, Dr. Carlis Abbott, called on arrival PA and will follow patient, recommend starting heparin.  CT shows occlusion of stent. Plan is for vascular management.  I ordered medication including heparin  for arterial occlusion right lower extremity.  Dilaudid, morphine, Zofran.  Pain better controlled with morphine. Reevaluation of the patient after these medicines showed that the patient improved I have reviewed the patients home medicines and have made adjustments as needed   Social Determinants of Health:  Lives at home with wife   Test / Admission - Considered:  admit         Final Clinical Impression(s) / ED Diagnoses Final diagnoses:  Ischemic leg    Rx / DC Orders ED Discharge Orders     None         Roque Lias 06/26/22 0636    Palumbo, April, MD 06/26/22 [id number]

## 2022-06-26 NOTE — Op Note (Signed)
    NAME: Timothy Arias    MRN: [id number] DOB: July 05, 1935    DATE OF OPERATION: 06/26/2022  PREOP DIAGNOSIS:    Right lower extremity acute limb ischemia  POSTOP DIAGNOSIS:    Same  PROCEDURE:    Ultrasound-guided micropuncture access of the left common femoral artery Aortogram Third order cannulation, right lower extremity angiogram Right lower extremity iliofemoral lytic catheter placement, thrombolysis initiation  SURGEON: Broadus John  ASSIST: None  ANESTHESIA: Moderate   EBL: 59m  INDICATIONS:    JGURFATEH FAGERBERGis a 87y.o. male who presented overnight with right lower extremity acute limb ischemia.  Previous iliac stents thrombosed on CT.  He was sensory and motor intact.  After discussing the risk and benefits of right-sided angiogram with possible thrombolysis catheter placement, [name] to proceed  FINDINGS:   No flow-limiting disease in the infrarenal aorta Left iliac system widely patent, right common iliac and external iliac thrombosed with reconstitution of the common femoral artery.  Stents appreciated in the common and proximal external iliac artery Widely patent common femoral artery, profunda.  The superficial femoral artery is patent, however there are multiple areas of tandem, 50% stenoses.  Widely patent popliteal artery.  The tibioperoneal trunk exhibits significant disease extending into the ostia of the tibial vessels.  There is slow outflow that is posterior tibial artery dominant.  3 vessels to the level of the ankle with the posterior tibial artery continuing into the foot.  TECHNIQUE:   Patient is brought to the Cath Lab and laid in the supine position.  He was prepped and draped in standard fashion a timeout was performed.  The case began with ultrasound insonation of the left common femoral artery.  Local anesthesia was administered and a ultrasound image was taken.  Next, an ultrasound-guided micropuncture needle was used to access the  left common femoral artery in retrograde fashion.  A wire was run into the infrarenal aorta followed by an Omni flush catheter.  Aortogram followed see results above.  Next, I was able to use a Glidewire advantage to navigate through the occluded right-sided iliac system.  True lumen was confirmed distally with angiography.  Angiography of the right lower extremity followed from the catheter was parked in the right common femoral artery.  See results above.  I elected to place a thrombolysis catheter in the right iliac system.  Left-sided access was dilated to a 6 French sheath that was parked in the proximal right common iliac artery. Thrombolysis catheter sizes were limited and therefore a 50 cm infusion length was utilized.  This extended from the right common iliac artery into the superficial femoral artery.  Angiography was used to confirm placement.  tPA through the lytic catheter was initiated at 1 mg/h.  Heparin was running through the sheath at 800 units/h.  Plan for return to OR tomorrow.  NPO midnight.   BP control is vital. Pt needs to be normotensive to lower risk of intracranial bleeding.   [name] MD [facility] AllianceDATE OF DICTATION:   06/26/2022

## 2022-06-26 NOTE — ED Triage Notes (Signed)
Pt arrived by ems from home w/ co rt leg pain. Rt leg pale with no pedal pulse. Hx stents.

## 2022-06-27 ENCOUNTER — Inpatient Hospital Stay (HOSPITAL_COMMUNITY)
Admission: EM | Disposition: A | Discharge status: Home / Self Care | Payer: Self-pay | Source: Home / Self Care | Attending: Vascular Surgery

## 2022-06-27 ENCOUNTER — Encounter (HOSPITAL_COMMUNITY): Payer: Self-pay | Admitting: Vascular Surgery

## 2022-06-27 ENCOUNTER — Ambulatory Visit (HOSPITAL_COMMUNITY): Admission: RE | Admit: 2022-06-27 | Payer: HMO | Source: Home / Self Care | Admitting: Surgery

## 2022-06-27 DIAGNOSIS — I743 Embolism and thrombosis of arteries of the lower extremities: Secondary | ICD-10-CM

## 2022-06-27 HISTORY — PX: PERIPHERAL VASCULAR INTERVENTION: CATH118257

## 2022-06-27 HISTORY — PX: PERIPHERAL VASCULAR THROMBECTOMY: CATH118306

## 2022-06-27 LAB — CBC
HCT: 36.3 % — ABNORMAL LOW (ref 39.0–52.0)
HCT: 35.8 % — ABNORMAL LOW (ref 39.0–52.0)
HCT: 41.1 % (ref 39.0–52.0)
Hemoglobin: 12.4 g/dL — ABNORMAL LOW (ref 13.0–17.0)
Hemoglobin: 12.3 g/dL — ABNORMAL LOW (ref 13.0–17.0)
Hemoglobin: 13.5 g/dL (ref 13.0–17.0)
MCH: 31.2 pg (ref 26.0–34.0)
MCH: 31.3 pg (ref 26.0–34.0)
MCH: 30.5 pg (ref 26.0–34.0)
MCHC: 34.2 g/dL (ref 30.0–36.0)
MCHC: 34.4 g/dL (ref 30.0–36.0)
MCHC: 32.8 g/dL (ref 30.0–36.0)
MCV: 91.2 fL (ref 80.0–100.0)
MCV: 91.1 fL (ref 80.0–100.0)
MCV: 92.8 fL (ref 80.0–100.0)
Platelets: 142 10*3/uL — ABNORMAL LOW (ref 150–400)
Platelets: 129 10*3/uL — ABNORMAL LOW (ref 150–400)
Platelets: 150 10*3/uL (ref 150–400)
RBC: 3.98 MIL/uL — ABNORMAL LOW (ref 4.22–5.81)
RBC: 3.93 MIL/uL — ABNORMAL LOW (ref 4.22–5.81)
RBC: 4.43 MIL/uL (ref 4.22–5.81)
RDW: 13.2 % (ref 11.5–15.5)
RDW: 13.2 % (ref 11.5–15.5)
RDW: 13.1 % (ref 11.5–15.5)
WBC: 7 10*3/uL (ref 4.0–10.5)
WBC: 6.9 10*3/uL (ref 4.0–10.5)
WBC: 9.9 10*3/uL (ref 4.0–10.5)
nRBC: 0 % (ref 0.0–0.2)
nRBC: 0 % (ref 0.0–0.2)
nRBC: 0 % (ref 0.0–0.2)

## 2022-06-27 LAB — HEPARIN LEVEL (UNFRACTIONATED): Heparin Unfractionated: 0.15 IU/mL — ABNORMAL LOW (ref 0.30–0.70)

## 2022-06-27 LAB — POCT ACTIVATED CLOTTING TIME
Activated Clotting Time: 179 seconds
Activated Clotting Time: 255 seconds
Activated Clotting Time: 206 seconds
Activated Clotting Time: 190 seconds
Activated Clotting Time: 174 seconds

## 2022-06-27 LAB — GLUCOSE, CAPILLARY: Glucose-Capillary: 110 mg/dL — ABNORMAL HIGH (ref 70–99)

## 2022-06-27 LAB — BASIC METABOLIC PANEL
Anion gap: 5 (ref 5–15)
BUN: 12 mg/dL (ref 8–23)
CO2: 23 mmol/L (ref 22–32)
Calcium: 8.8 mg/dL — ABNORMAL LOW (ref 8.9–10.3)
Chloride: 109 mmol/L (ref 98–111)
Creatinine, Ser: 1.17 mg/dL (ref 0.61–1.24)
GFR, Estimated: >60 mL/min (ref >60)
Glucose, Bld: 117 mg/dL — ABNORMAL HIGH (ref 70–99)
Potassium: 3.7 mmol/L (ref 3.5–5.1)
Sodium: 137 mmol/L (ref 135–145)

## 2022-06-27 LAB — FIBRINOGEN
Fibrinogen: 447 mg/dL (ref 210–475)
Fibrinogen: 430 mg/dL (ref 210–475)

## 2022-06-27 LAB — SURGICAL PCR SCREEN
MRSA, PCR: NEGATIVE
Staphylococcus aureus: NEGATIVE

## 2022-06-27 SURGERY — PERIPHERAL VASCULAR THROMBECTOMY
Anesthesia: LOCAL

## 2022-06-27 MED ORDER — MIDAZOLAM HCL 5 MG/5ML IJ SOLN
INTRAMUSCULAR | Status: AC
  Filled 2022-06-27: qty 5

## 2022-06-27 MED ORDER — HEPARIN (PORCINE) IN NACL 1000-0.9 UT/500ML-% IV SOLN
INTRAVENOUS | Status: DC | PRN
  Administered 2022-06-27 (×2): 500 mL

## 2022-06-27 MED ORDER — MUPIROCIN 2 % EX OINT
1.0000 | TOPICAL_OINTMENT | Freq: Two times a day (BID) | CUTANEOUS | Status: DC
  Administered 2022-06-27 – 2022-06-29 (×3): 1 via NASAL
  Filled 2022-06-27 (×2): qty 22

## 2022-06-27 MED ORDER — ORAL CARE MOUTH RINSE: 15.0000 mL | OROMUCOSAL | Status: DC | PRN

## 2022-06-27 MED ORDER — MORPHINE SULFATE (PF) 2 MG/ML IV SOLN
INTRAVENOUS | Status: AC
  Filled 2022-06-27: qty 1

## 2022-06-27 MED ORDER — HYDRALAZINE HCL 20 MG/ML IJ SOLN: 5.0000 mg | INTRAMUSCULAR | Status: DC | PRN

## 2022-06-27 MED ORDER — ONDANSETRON HCL 4 MG/2ML IJ SOLN: 4.0000 mg | Freq: Four times a day (QID) | INTRAMUSCULAR | Status: DC | PRN

## 2022-06-27 MED ORDER — FENTANYL CITRATE (PF) 100 MCG/2ML IJ SOLN
INTRAMUSCULAR | Status: AC
  Filled 2022-06-27: qty 2

## 2022-06-27 MED ORDER — HEPARIN SODIUM (PORCINE) 1000 UNIT/ML IJ SOLN
INTRAMUSCULAR | Status: DC | PRN
  Administered 2022-06-27: 8000 [IU] via INTRAVENOUS

## 2022-06-27 MED ORDER — HYDRALAZINE HCL 20 MG/ML IJ SOLN
INTRAMUSCULAR | Status: DC | PRN
  Administered 2022-06-27: 10 mg via INTRAVENOUS

## 2022-06-27 MED ORDER — LIDOCAINE HCL (PF) 1 % IJ SOLN
INTRAMUSCULAR | Status: DC | PRN
  Administered 2022-06-27: 10 mL

## 2022-06-27 MED ORDER — SODIUM CHLORIDE 0.9% FLUSH: 3.0000 mL | INTRAVENOUS | Status: DC | PRN

## 2022-06-27 MED ORDER — HALOPERIDOL LACTATE 5 MG/ML IJ SOLN: 2.0000 mg | Freq: Once | INTRAMUSCULAR | Status: AC

## 2022-06-27 MED ORDER — IODIXANOL 320 MG/ML IV SOLN
INTRAVENOUS | Status: DC | PRN
  Administered 2022-06-27: 120 mL

## 2022-06-27 MED ORDER — ASPIRIN 81 MG PO TBEC
81.0000 mg | DELAYED_RELEASE_TABLET | Freq: Every day | ORAL | Status: DC
  Administered 2022-06-27 – 2022-06-29 (×3): 81 mg via ORAL
  Filled 2022-06-27 (×3): qty 1

## 2022-06-27 MED ORDER — HEPARIN SODIUM (PORCINE) 1000 UNIT/ML IJ SOLN
INTRAMUSCULAR | Status: AC
  Filled 2022-06-27: qty 10

## 2022-06-27 MED ORDER — SODIUM CHLORIDE 0.9% FLUSH
3.0000 mL | Freq: Two times a day (BID) | INTRAVENOUS | Status: DC
  Administered 2022-06-28 – 2022-06-29 (×3): 3 mL via INTRAVENOUS

## 2022-06-27 MED ORDER — MORPHINE SULFATE (PF) 2 MG/ML IV SOLN
2.0000 mg | INTRAVENOUS | Status: DC | PRN
  Administered 2022-06-27 – 2022-06-28 (×3): 2 mg via INTRAVENOUS
  Filled 2022-06-27 (×2): qty 1

## 2022-06-27 MED ORDER — SODIUM CHLORIDE 0.9 % IV SOLN: 250.0000 mL | INTRAVENOUS | Status: DC | PRN

## 2022-06-27 MED ORDER — LIDOCAINE HCL (PF) 1 % IJ SOLN
INTRAMUSCULAR | Status: AC
  Filled 2022-06-27: qty 30

## 2022-06-27 MED ORDER — FENTANYL CITRATE (PF) 100 MCG/2ML IJ SOLN
INTRAMUSCULAR | Status: DC | PRN
  Administered 2022-06-27: 25 ug via INTRAVENOUS
  Administered 2022-06-27: 50 ug via INTRAVENOUS
  Administered 2022-06-27 (×2): 25 ug via INTRAVENOUS

## 2022-06-27 MED ORDER — MIDAZOLAM HCL 2 MG/2ML IJ SOLN
INTRAMUSCULAR | Status: DC | PRN
  Administered 2022-06-27: 2 mg via INTRAVENOUS
  Administered 2022-06-27: 1 mg via INTRAVENOUS

## 2022-06-27 MED ORDER — HALOPERIDOL LACTATE 5 MG/ML IJ SOLN
INTRAMUSCULAR | Status: AC
  Administered 2022-06-27: 2 mg via INTRAVENOUS
  Filled 2022-06-27: qty 1

## 2022-06-27 MED ORDER — CEFAZOLIN SODIUM-DEXTROSE 1-4 GM/50ML-% IV SOLN
1.0000 g | Freq: Three times a day (TID) | INTRAVENOUS | Status: DC
  Administered 2022-06-27 – 2022-06-29 (×5): 1 g via INTRAVENOUS
  Filled 2022-06-27 (×8): qty 50

## 2022-06-27 MED ORDER — SODIUM CHLORIDE 0.9 % WEIGHT BASED INFUSION: 1.0000 mL/kg/h | INTRAVENOUS | Status: AC

## 2022-06-27 MED ORDER — OXYCODONE HCL 5 MG PO TABS: 5.0000 mg | ORAL_TABLET | ORAL | Status: DC | PRN

## 2022-06-27 MED ORDER — LABETALOL HCL 5 MG/ML IV SOLN
10.0000 mg | INTRAVENOUS | Status: DC | PRN
  Administered 2022-06-27: 10 mg via INTRAVENOUS

## 2022-06-27 MED ORDER — ACETAMINOPHEN 325 MG PO TABS: 650.0000 mg | ORAL_TABLET | ORAL | Status: DC | PRN

## 2022-06-27 MED ORDER — LABETALOL HCL 5 MG/ML IV SOLN
INTRAVENOUS | Status: AC
  Filled 2022-06-27: qty 4

## 2022-06-27 MED FILL — Heparin Sodium (Porcine) Inj 1000 Unit/ML: INTRAMUSCULAR | Qty: 10 | Status: AC

## 2022-06-27 SURGICAL SUPPLY — 18 items
BALLN MUSTANG 10X20X75 (BALLOONS) ×2
BALLN MUSTANG 10X40X135 (BALLOONS) ×2
BALLOON MUSTANG 10X20X75 (BALLOONS) IMPLANT
BALLOON MUSTANG 10X40X135 (BALLOONS) IMPLANT
CANISTER PENUMBRA MAX (MISCELLANEOUS) IMPLANT
CATH ANGIO 5F BER2 65CM (CATHETERS) IMPLANT
CATH LIGHTNING 7 XTORQ 130 (CATHETERS) IMPLANT
CLOSURE PERCLOSE PROSTYLE (VASCULAR PRODUCTS) IMPLANT
DEVICE TORQUE H2O (MISCELLANEOUS) IMPLANT
GUIDEWIRE ANGLED .035X150CM (WIRE) IMPLANT
KIT ENCORE 26 ADVANTAGE (KITS) IMPLANT
MAT PREVALON FULL STRYKER (MISCELLANEOUS) IMPLANT
SHEATH CATAPULT 7FR 45 (SHEATH) IMPLANT
SHEATH PINNACLE 7F 10CM (SHEATH) IMPLANT
STENT VIABAHN 8X39X80 VBX (Permanent Stent) IMPLANT
STENT VIABAHNBX 8X59X80 (Permanent Stent) IMPLANT
WIRE BENTSON .035X145CM (WIRE) IMPLANT
WIRE ROSEN-J .035X260CM (WIRE) IMPLANT

## 2022-06-27 NOTE — Progress Notes (Addendum)
  Progress Note    06/27/2022 7:28 AM 1 Day Post-Op  Subjective:  denies any pain in his right foot    Vitals:   06/27/22 0615 06/27/22 0625  BP: 125/81 (!) 143/68  Pulse: 71 74  Resp: 14 20  Temp:    SpO2: 95% 97%    Physical Exam: General:  NAD Cardiac:  regular rate and rhythm Lungs:  nonlabored Extremities:  lytic catheter secure in L thigh, no bleeding. RLE exam unchanged, monophasic PT signal   CBC    Component Value Date/Time   WBC 7.0 06/27/2022 0431   RBC 3.98 (L) 06/27/2022 0431   HGB 12.4 (L) 06/27/2022 0431   HCT 36.3 (L) 06/27/2022 0431   PLT 142 (L) 06/27/2022 0431   MCV 91.2 06/27/2022 0431   MCH 31.2 06/27/2022 0431   MCHC 34.2 06/27/2022 0431   RDW 13.2 06/27/2022 0431   LYMPHSABS 2.7 06/26/2022 0135   MONOABS 0.8 06/26/2022 0135   EOSABS 0.0 06/26/2022 0135   BASOSABS 0.0 06/26/2022 0135    BMET    Component Value Date/Time   NA 137 06/27/2022 0431   K 3.7 06/27/2022 0431   CL 109 06/27/2022 0431   CO2 23 06/27/2022 0431   GLUCOSE 117 (H) 06/27/2022 0431   BUN 12 06/27/2022 0431   CREATININE 1.17 06/27/2022 0431   CALCIUM 8.8 (L) 06/27/2022 0431   GFRNONAA >60 06/27/2022 0431   GFRAA >60 06/09/2019 1705    INR    Component Value Date/Time   INR 1.0 06/26/2022 0135     Intake/Output Summary (Last 24 hours) at 06/27/2022 0728 Last data filed at 06/27/2022 0600 Gross per 24 hour  Intake 2435.17 ml  Output 700 ml  Net 1735.17 ml      Assessment/Plan:  87 y.o. male with occluded iliac stents  -Currently no change in exam but pain is controlled. Soft monophasic PT on the left. Can still move his foot -Lytics catheter secure in L thigh -Patient is on heparin gtt. Pt is NPO for takeback to cath lab this afternoon  Vicente Serene, [facility] [phone] 06/27/2022 7:28 AM  I have seen and evaluated the patient. I agree with the PA note as documented above.  More brisk pedal signals in the right foot  with improvement in pain after initiation of thrombolysis yesterday for occluded right iliac stents.  Plan return to the Cath Lab today with Dr. Trula Slade.  Left transfemoral sheath looks fine.  Continue tPA and heparin this morning.  NPO.  Marty Heck, MD [facility] Office: [phone]

## 2022-06-27 NOTE — Progress Notes (Addendum)
Site area- left groin, with prior hematoma  Site Prior to Removal-level 2 hematoma; hemodynamically stable, no volume need; MD aware of pre-existing hematoma.   Pressure Applied For-  25 MInutes   Bedrest Beginning at - 1622   Manual- Yes   Patient Status During Pull- Stable    Post Pull Groin Site-  level 2 hematoma mostly expelled during manual hold. Left groin mostly soft, purple bruising around insertion site.  Post Pull Instructions Given- Yes   Post Pull Pulses Present- Yes left dp palpable   Dressing Applied- Tegaderm and Gauze Dressing    Comments:  Pt verbalizes understanding of groin care instructions.

## 2022-06-27 NOTE — Progress Notes (Signed)
[place] for Heparin  Indication: Lower extremity arterial occulusion   No Known Allergies  Patient Measurements: Height: 6' (182.9 cm) Weight: 104.3 kg (230 lb) IBW/kg (Calculated) : 77.6 Heparin Dosing Weight: 99 Kg  Vital Signs: Temp: 99 F (37.2 C) (03/04 2300) Temp Source: Oral (03/04 2300) BP: 132/66 (03/05 0100) Pulse Rate: 70 (03/05 0100)  Labs: Recent Labs    06/26/22 0135 06/26/22 0139 06/26/22 0349 06/26/22 1000 06/26/22 1705 06/26/22 2255  HGB 15.9 15.3  --   --  15.0 13.1  HCT 44.8 45.0  --   --  43.9 37.8*  PLT 152  --   --   --  162 150  LABPROT 12.9  --   --   --   --   --   INR 1.0  --   --   --   --   --   HEPARINUNFRC  --   --   --  0.64 >1.10* 0.44  CREATININE 1.31* 1.20  --   --   --   --   TROPONINIHS 59*  --  44*  --   --   --      Estimated Creatinine Clearance: 57.2 mL/min (by C-G formula based on SCr of 1.2 mg/dL).   Medical History: Past Medical History:  Diagnosis Date   BPH (benign prostatic hyperplasia)    Colon polyps    Coronary artery disease    Dyslipidemia    ED (erectile dysfunction)    Hydrocele    Hyperlipidemia    Hypertension    MI (myocardial infarction) ([place]) 1992   Nocturnal enuresis      Assessment:  75 yoM presenting to the ED found to have an arterial occlusion in in leg. Patient is known to the VVS service with PMH of PAD. No oral anticoagulation reported prior to admission. Pharmacy has been consulted to dose heparin.  Heparin level 0.44 units/mL (therapeutic) -catheter directed thrombolytic therapy initiated 06/26/22. Vascular team is planning for return OR trip 06/27/22. RN reports surgical site looks appropriate.   Goal of Therapy:  Heparin level 0.2-0.5 units/ml Monitor platelets by anticoagulation protocol: Yes   Plan:  Continue heparin at 800 units/hr  Check heparin level at 0900 F/u vascular procedure and lytic plans  Georga Bora, PharmD Clinical  Pharmacist 06/27/2022 1:24 AM Please check AMION for all [facility] [facility] numbers

## 2022-06-27 NOTE — Progress Notes (Signed)
Event Note:  Called to patient's room by primary RN Ryland d/t pt confusion, agitation that progressed to aggression (pt swinging at nurses). On arrival, pt standing without clothing or monitors in back of room yelling at staff to "get out of my house!" Pt unsteady on feet, obvious fall risk.   Security called to assist in deescalation.  Pt not responsive to deescalation by staff or security, refused to sit down, verbally and physically threatening.   VVS MD on call Dr. Trula Slade called by Ryland, gave orders for '2mg'$  IV haldol and restraints. By then security assisted the pt to sit on edge of bed so he did not fall, at which time I administered the haldol. We waiting ~5-23mn, but there was no effect. Pt continued to try standing back up and being aggressive. Noted to have morphine on MAR, so gave that. It had no effect either. Versed noted to be on PRNs as well, so gave that, which finally calmed pt enough to allow security and RNs to help him back to bed, after which we placed pt in bilateral soft wrist restraints per Dr. BTrula Slade VS taken and pt assessed afterwards, WDL. Vascular fem site unchanged from prior assessment.   Primary RN to continue monitoring per protocol. Pt now resting, RASS -1, VSS.     RHenreitta Leber RN 2H Charge Nurse 06/27/22

## 2022-06-27 NOTE — Interval H&P Note (Signed)
History and Physical Interval Note:  06/27/2022 10:23 AM  Timothy Arias  has presented today for surgery, with the diagnosis of lysis recheck.  The various methods of treatment have been discussed with the patient and family. After consideration of risks, benefits and other options for treatment, the patient has consented to  Procedure(s): LYSIS RECHECK (N/A) as a surgical intervention.  The patient's history has been reviewed, patient examined, no change in status, stable for surgery.  I have reviewed the patient's chart and labs.  Questions were answered to the patient's satisfaction.     Annamarie Major

## 2022-06-27 NOTE — H&P (View-Only) (Signed)
  Progress Note    06/27/2022 7:28 AM 1 Day Post-Op  Subjective:  denies any pain in his right foot    Vitals:   06/27/22 0615 06/27/22 0625  BP: 125/81 (!) 143/68  Pulse: 71 74  Resp: 14 20  Temp:    SpO2: 95% 97%    Physical Exam: General:  NAD Cardiac:  regular rate and rhythm Lungs:  nonlabored Extremities:  lytic catheter secure in L thigh, no bleeding. RLE exam unchanged, monophasic PT signal   CBC    Component Value Date/Time   WBC 7.0 06/27/2022 0431   RBC 3.98 (L) 06/27/2022 0431   HGB 12.4 (L) 06/27/2022 0431   HCT 36.3 (L) 06/27/2022 0431   PLT 142 (L) 06/27/2022 0431   MCV 91.2 06/27/2022 0431   MCH 31.2 06/27/2022 0431   MCHC 34.2 06/27/2022 0431   RDW 13.2 06/27/2022 0431   LYMPHSABS 2.7 06/26/2022 0135   MONOABS 0.8 06/26/2022 0135   EOSABS 0.0 06/26/2022 0135   BASOSABS 0.0 06/26/2022 0135    BMET    Component Value Date/Time   NA 137 06/27/2022 0431   K 3.7 06/27/2022 0431   CL 109 06/27/2022 0431   CO2 23 06/27/2022 0431   GLUCOSE 117 (H) 06/27/2022 0431   BUN 12 06/27/2022 0431   CREATININE 1.17 06/27/2022 0431   CALCIUM 8.8 (L) 06/27/2022 0431   GFRNONAA >60 06/27/2022 0431   GFRAA >60 06/09/2019 1705    INR    Component Value Date/Time   INR 1.0 06/26/2022 0135     Intake/Output Summary (Last 24 hours) at 06/27/2022 0728 Last data filed at 06/27/2022 0600 Gross per 24 hour  Intake 2435.17 ml  Output 700 ml  Net 1735.17 ml      Assessment/Plan:  87 y.o. male with occluded iliac stents  -Currently no change in exam but pain is controlled. Soft monophasic PT on the left. Can still move his foot -Lytics catheter secure in L thigh -Patient is on heparin gtt. Pt is NPO for takeback to cath lab this afternoon  Vicente Serene, [place] [facility] [phone] 06/27/2022 7:28 AM  I have seen and evaluated the patient. I agree with the PA note as documented above.  More brisk pedal signals in the right foot  with improvement in pain after initiation of thrombolysis yesterday for occluded right iliac stents.  Plan return to the Cath Lab today with Dr. Trula Slade.  Left transfemoral sheath looks fine.  Continue tPA and heparin this morning.  NPO.  Marty Heck, MD [facility] [place] Office: [phone]

## 2022-06-27 NOTE — Op Note (Signed)
Patient name: Timothy Arias MRN: [id number] DOB: 1936-03-15 Sex: male  06/27/2022 Pre-operative Diagnosis: follow up lysis Post-operative diagnosis:  Same Surgeon:  Annamarie Major Procedure Performed:  1.  Follow-up thrombolytic evaluation  2.  Conscious sedation, 105 minutes  3.  Mechanical thrombectomy, right common iliac artery (penumbra CAT 7)  4.  Mechanical thrombectomy, right external iliac artery (penumbra CAT 7)  5.  Stent, right common iliac artery  6.  Stent, right external iliac artery  7.  Stent, left common iliac artery  8.  Right lower extremity runoff   Indications: The patient has a history of right iliac stenting.  These have occluded.  He had a thrombolytics catheter placed yesterday for overnight administration of tPA.  He comes in today for follow-up study.  Procedure:  The patient was identified in the holding area and taken to room 8.  The patient was then placed supine on the table and prepped and draped in the usual sterile fashion.  A time out was called.  Conscious sedation was administered with the use of IV fentanyl and Versed under continuous physician and nurse monitoring.  Heart rate, blood pressure, and oxygen saturation were continuously monitored.  Total sedation time was 176mnutes.  Contrast was administered through the existing sheath after removing the infusion catheter over a Rosen wire.  This revealed that the stents were open however there did appear to be a tongue of thrombus that extended from the common iliac into the external iliac artery.  At this point, the patient was rebolus with heparin.  I upsized to a 7 x 45 catapulted sheath.  I then selected the penumbra CAT 7 device and perform mechanical thrombectomy of the right common iliac and external iliac artery with successful evacuation of thrombus.  Upon further evaluation of the previously placed stents, there did not appear to be overlap between the distal and proximal stent, further evidenced  by the fact that the stent was undersized and perfusion was still present in the right hypogastric artery.  I felt that these needed to be treated with a single stent to avoid stent separation.  I used a 8 x 59 VBX and then postdilated this with a 10 mm balloon.  Follow-up imaging showed inline flow through the common and external iliac artery with expected occlusion of the hypogastric artery.  I then performed runoff of the right leg which was unchanged from the day prior.  I then evaluated the left iliac system as there appeared to be a dissection in the common iliac artery.  I felt this should be addressed and so it was covered with an 8 x 39 VBX stent which was postdilated with a 10 mm balloon.  Follow-up imaging showed resolution of the dissection and preservation of the left hypogastric artery.  I then tried to close the artery with a ProGlide but did not get adequate hemostasis and so I 7 FPakistansheath was reinserted and the patient be taken the holding area for sheath pull once his coagulation profile corrects   Impression:  #1  Follow-up thrombolytic study reveals patent right common iliac stent with residual tunneling of thrombus extending into the external iliac artery which was able to be successfully retrieved with mechanical thrombectomy using the penumbra CAT 7.  There did appear to be separation of the previously placed stents.  In addition the distal stent was not adherent to the wall of the artery.  Therefore a 859 VBX stent was placed spanning the previous  stents and this was postdilated with a 10 mm balloon  #2  Luminal irregularity in the left common iliac artery, likely a dissection, treated using an 8 x 39 VBX stent, postdilated with a 10 mm balloon     V. Annamarie Major, M.D., [facility] [place] Office: [phone] Pager:  [phone]

## 2022-06-28 ENCOUNTER — Encounter (HOSPITAL_COMMUNITY): Payer: Self-pay | Admitting: Surgery

## 2022-06-28 DIAGNOSIS — I743 Embolism and thrombosis of arteries of the lower extremities: Secondary | ICD-10-CM | POA: Diagnosis not present

## 2022-06-28 LAB — LIPID PANEL
Cholesterol: 208 mg/dL — ABNORMAL HIGH (ref 0–200)
HDL: 39 mg/dL — ABNORMAL LOW (ref >40)
LDL Cholesterol: 143 mg/dL — ABNORMAL HIGH (ref 0–99)
Total CHOL/HDL Ratio: 5.3 RATIO
Triglycerides: 130 mg/dL (ref <150)
VLDL: 26 mg/dL (ref 0–40)

## 2022-06-28 LAB — CBC
HCT: 40 % (ref 39.0–52.0)
Hemoglobin: 13.2 g/dL (ref 13.0–17.0)
MCH: 30.7 pg (ref 26.0–34.0)
MCHC: 33 g/dL (ref 30.0–36.0)
MCV: 93 fL (ref 80.0–100.0)
Platelets: 143 10*3/uL — ABNORMAL LOW (ref 150–400)
RBC: 4.3 MIL/uL (ref 4.22–5.81)
RDW: 13 % (ref 11.5–15.5)
WBC: 11.2 10*3/uL — ABNORMAL HIGH (ref 4.0–10.5)
nRBC: 0 % (ref 0.0–0.2)

## 2022-06-28 MED ORDER — HALOPERIDOL LACTATE 5 MG/ML IJ SOLN
2.0000 mg | INTRAMUSCULAR | Status: DC | PRN
  Administered 2022-06-28: 2 mg via INTRAVENOUS
  Filled 2022-06-28: qty 1

## 2022-06-28 MED ORDER — MEMANTINE HCL 10 MG PO TABS
10.0000 mg | ORAL_TABLET | Freq: Two times a day (BID) | ORAL | Status: DC
  Administered 2022-06-28 – 2022-06-29 (×3): 10 mg via ORAL
  Filled 2022-06-28 (×4): qty 1

## 2022-06-28 MED ORDER — METOPROLOL SUCCINATE ER 50 MG PO TB24
50.0000 mg | ORAL_TABLET | Freq: Every day | ORAL | Status: DC
  Administered 2022-06-28 – 2022-06-29 (×2): 50 mg via ORAL
  Filled 2022-06-28 (×2): qty 1

## 2022-06-28 MED ORDER — CLOPIDOGREL BISULFATE 75 MG PO TABS
75.0000 mg | ORAL_TABLET | Freq: Every day | ORAL | Status: DC
  Administered 2022-06-28 – 2022-06-29 (×2): 75 mg via ORAL
  Filled 2022-06-28 (×2): qty 1

## 2022-06-28 MED ORDER — CITALOPRAM HYDROBROMIDE 20 MG PO TABS
20.0000 mg | ORAL_TABLET | Freq: Every day | ORAL | Status: DC
  Administered 2022-06-28 – 2022-06-29 (×2): 20 mg via ORAL
  Filled 2022-06-28 (×2): qty 1

## 2022-06-28 MED ORDER — GALANTAMINE HYDROBROMIDE ER 8 MG PO CP24
16.0000 mg | ORAL_CAPSULE | Freq: Every day | ORAL | Status: DC
  Administered 2022-06-28 – 2022-06-29 (×2): 16 mg via ORAL
  Filled 2022-06-28 (×2): qty 2

## 2022-06-28 NOTE — Progress Notes (Addendum)
  Progress Note    06/28/2022 8:08 AM 1 Day Post-Op  Subjective:  no complaints. does not want to be touched or looked at. Denies pain   Vitals:   06/28/22 0630 06/28/22 0700  BP: (!) 148/55 (!) 140/57  Pulse: 75 72  Resp: 10 (!) 9  Temp:    SpO2: 98% 94%    Physical Exam: General:  resting comfortably Cardiac:  regular Lungs:  nonlabored Extremities:  unable to examine. will wiggle toes on command   CBC    Component Value Date/Time   WBC 9.9 06/27/2022 1945   RBC 4.43 06/27/2022 1945   HGB 13.5 06/27/2022 1945   HCT 41.1 06/27/2022 1945   PLT 150 06/27/2022 1945   MCV 92.8 06/27/2022 1945   MCH 30.5 06/27/2022 1945   MCHC 32.8 06/27/2022 1945   RDW 13.1 06/27/2022 1945   LYMPHSABS 2.7 06/26/2022 0135   MONOABS 0.8 06/26/2022 0135   EOSABS 0.0 06/26/2022 0135   BASOSABS 0.0 06/26/2022 0135    BMET    Component Value Date/Time   NA 137 06/27/2022 0431   K 3.7 06/27/2022 0431   CL 109 06/27/2022 0431   CO2 23 06/27/2022 0431   GLUCOSE 117 (H) 06/27/2022 0431   BUN 12 06/27/2022 0431   CREATININE 1.17 06/27/2022 0431   CALCIUM 8.8 (L) 06/27/2022 0431   GFRNONAA >60 06/27/2022 0431   GFRAA >60 06/09/2019 1705    INR    Component Value Date/Time   INR 1.0 06/26/2022 0135     Intake/Output Summary (Last 24 hours) at 06/28/2022 0808 Last data filed at 06/28/2022 0600 Gross per 24 hour  Intake 2149.76 ml  Output 650 ml  Net 1499.76 ml      Assessment/Plan:  87 y.o. male is 1 day post op, s/p: thrombolysis, mechanical thrombectomy of R CIA/EIA, and stenting of right CIA, EIA, and left CIA   -The patient became agitated and combative overnight requiring haldol and versed to calm down. He is not agreeable this morning to being examined. He denies any pain in his feet -He will wiggle his toes on command  Vicente Serene, PA-C [facility] [phone] 06/28/2022 8:08 AM  I have seen and evaluated the patient. I agree with the PA note  as documented above.  87 year old male admitted with occluded right iliac stents that underwent thrombolysis and returned yesterday with placement of additional right iliac stent after percutaneous thrombectomy.  Today he is sitting up in the ICU.  Right DP palpable and pain is resolved.  He did have a hematoma in his left groin after sheath removal but this looks stable.  Very agitated overnight and has some baseline dementia.  I discussed with his wife who is concerned about discharge and being able to take care of him until he has been able to work with therapy.  Will transfer to 4 E.  PT OT ordered.  Will add Plavix to the aspirin.  Marty Heck, MD [facility] [place] Office: [phone]

## 2022-06-28 NOTE — Progress Notes (Signed)
Patient with increasing confusion/disorientation. Attempts to reorient, bargain, and verbally de-escalate failed. Multiple attempts made to contact family and were unsuccessful. Patient became verbally and physically aggressive while climbing out of bed. Swinging arms at nurses who were trying to prevent him from falling and injuring himself. Patient refused to get back in bed or listen to staff members. Security to bedside, charge nurse to bedside. Attending vascular on-call physician notified and gave orders for medications and restraints. See MAR for medication administration. See restraint documentation. See charge nurse progress note.  Brewer Hitchman Franchot Gallo, RN 06/27/22

## 2022-06-29 ENCOUNTER — Other Ambulatory Visit: Payer: Self-pay | Admitting: Physician Assistant

## 2022-06-29 DIAGNOSIS — I743 Embolism and thrombosis of arteries of the lower extremities: Secondary | ICD-10-CM | POA: Diagnosis not present

## 2022-06-29 LAB — CBC
HCT: 33.4 % — ABNORMAL LOW (ref 39.0–52.0)
Hemoglobin: 11.2 g/dL — ABNORMAL LOW (ref 13.0–17.0)
MCH: 30.7 pg (ref 26.0–34.0)
MCHC: 33.5 g/dL (ref 30.0–36.0)
MCV: 91.5 fL (ref 80.0–100.0)
Platelets: 129 10*3/uL — ABNORMAL LOW (ref 150–400)
RBC: 3.65 MIL/uL — ABNORMAL LOW (ref 4.22–5.81)
RDW: 12.9 % (ref 11.5–15.5)
WBC: 9.5 10*3/uL (ref 4.0–10.5)
nRBC: 0 % (ref 0.0–0.2)

## 2022-06-29 LAB — POCT ACTIVATED CLOTTING TIME: Activated Clotting Time: 320 seconds

## 2022-06-29 MED ORDER — CLOPIDOGREL BISULFATE 75 MG PO TABS: 75.0000 mg | ORAL_TABLET | Freq: Every day | ORAL | 3 refills | Status: DC

## 2022-06-29 MED ORDER — ATORVASTATIN CALCIUM 80 MG PO TABS: 80.0000 mg | ORAL_TABLET | Freq: Every day | ORAL | Status: DC

## 2022-06-29 MED ORDER — ATORVASTATIN CALCIUM 80 MG PO TABS: 80.0000 mg | ORAL_TABLET | Freq: Every day | ORAL | 11 refills | Status: AC

## 2022-06-29 MED FILL — Heparin Sodium (Porcine) Inj 1000 Unit/ML: INTRAMUSCULAR | Qty: 10 | Status: AC

## 2022-06-29 NOTE — Discharge Instructions (Signed)
° °  [facility] [place] ° °Discharge Instructions ° °Lower Extremity Angiogram; Angioplasty/Stenting ° °Please refer to the following instructions for your post-procedure care. Your surgeon or physician assistant will discuss any changes with you. ° °Activity ° °Avoid lifting more than 8 pounds (1 gallons of milk) for 72 hours (3 days) after your procedure. You may walk as much as you can tolerate. It's OK to drive after 72 hours. ° °Bathing/Showering ° °You may shower the day after your procedure. If you have a bandage, you may remove it at 24- 48 hours. Clean your incision site with mild soap and water. Pat the area dry with a clean towel. ° °Diet ° °Resume your pre-procedure diet. There are no special food restrictions following this procedure. All patients with peripheral vascular disease should follow a low fat/low cholesterol diet. In order to heal from your surgery, it is CRITICAL to get adequate nutrition. Your body requires vitamins, minerals, and protein. Vegetables are the best source of vitamins and minerals. Vegetables also provide the perfect balance of protein. Processed food has little nutritional value, so try to avoid this. ° °Medications ° °Resume taking all of your medications unless your doctor tells you not to. If your incision is causing pain, you may take over-the-counter pain relievers such as acetaminophen (Tylenol) ° °Follow Up ° °Follow up will be arranged at the time of your procedure. You may have an office visit scheduled or may be scheduled for surgery. Ask your surgeon if you have any questions. ° °Please call us immediately for any of the following conditions: °•Severe or worsening pain your legs or feet at rest or with walking. °•Increased pain, redness, drainage at your groin puncture site. °•Fever of 101 degrees or higher. °•If you have any mild or slow bleeding from your puncture site: lie down, apply firm constant pressure over the area with a piece of  gauze or a clean wash cloth for 30 minutes- no peeking!, call 911 right away if you are still bleeding after 30 minutes, or if the bleeding is heavy and unmanageable. ° °Reduce your risk factors of vascular disease: ° °Stop smoking. If you would like help call [facility] at 1-800-QUIT-NOW (1-800-784-8669) or [facility] at 336-586-4000. °Manage your cholesterol °Maintain a desired weight °Control your diabetes °Keep your blood pressure down ° °If you have any questions, please call the office at [phone] ° °

## 2022-06-29 NOTE — Evaluation (Signed)
Physical Therapy Evaluation Patient Details Name: Timothy Arias MRN: [id number] DOB: 12-Feb-1936 Today's Date: 06/29/2022  History of Present Illness  Pt is an 87 y/o male admitted with R LE ischemia. Underwent mechanical thrombectomy of RLE on 3/5. PMH: BPH, CAD, ED, HLD, HTN, MI  Clinical Impression  PTA pt living with wife in single story home with 5 steps to enter. Pt reports he was able to ambulate without AD, but reports wife assists with iADLs and medication management. Pt can not remember why he is in the hospital. Pt is min guard for bed mobility, transfers and ambulation with and without AD. Requires min A for ascent/descent flight of stairs. Pt very likely near his baseline for both mobility and cognition. Given his combativeness at night PT recommends return home as soon as possible. Pt has no PT or DME needs and is signing off. Referred to Mobility specialist.       Recommendations for follow up therapy are one component of a multi-disciplinary discharge planning process, led by the attending physician.  Recommendations may be updated based on patient status, additional functional criteria and insurance authorization.  Follow Up Recommendations No PT follow up      Assistance Recommended at Discharge Intermittent Supervision/Assistance  Patient can return home with the following  A little help with walking and/or transfers;Two people to help with walking and/or transfers;Direct supervision/assist for medications management;Assist for transportation;Assistance with cooking/housework    Equipment Recommendations None recommended by PT     Functional Status Assessment Patient has not had a recent decline in their functional status     Precautions / Restrictions Precautions Precautions: Fall Restrictions Weight Bearing Restrictions: No      Mobility  Bed Mobility Overal bed mobility: Needs Assistance Bed Mobility: Supine to Sit     Supine to sit: Min guard      General bed mobility comments: pt able to pull self up easily with handheld assist, tactile cues to advance LEs    Transfers Overall transfer level: Needs assistance Equipment used: Rolling walker (2 wheels) Transfers: Sit to/from Stand Sit to Stand: Min guard                Ambulation/Gait Ambulation/Gait assistance: Min guard Gait Distance (Feet): 75 Feet Assistive device: Rolling walker (2 wheels), None Gait Pattern/deviations: Step-through pattern, Decreased step length - right, Decreased step length - left, Shuffle Gait velocity: WFL Gait velocity interpretation: 1.31 - 2.62 ft/sec, indicative of limited community ambulator   General Gait Details: min guard for safety, initiated ambulation with RW however able to walk back from stairwell with only minimal balance deficits.  Stairs Stairs: Yes Stairs assistance: Min assist, Min guard Stair Management: One rail Left, Step to pattern, Forwards, With walker Number of Stairs: 12 General stair comments: pt utilized rail on L and walker supported by OT on left due to inability to reach both rails with ascent of steps, able to descend steps with min guard and use of only rail     Balance Overall balance assessment: Needs assistance Sitting-balance support: No upper extremity supported, Feet supported Sitting balance-Leahy Scale: Good     Standing balance support: No upper extremity supported, During functional activity Standing balance-Leahy Scale: Good Standing balance comment: able to progress to mobilize without AD                             Pertinent Vitals/Pain Pain Assessment Pain Assessment: Faces Faces Pain Scale:  Hurts a little bit Pain Location: R knee Pain Descriptors / Indicators: Sore Pain Intervention(s): Monitored during session, Repositioned    Home Living Family/patient expects to be discharged to:: Private residence Living Arrangements: Spouse/significant other Available Help at  Discharge: Family;Available 24 hours/day Type of Home: House Home Access: Stairs to enter Entrance Stairs-Rails: Psychiatric nurse of Steps: 5   Home Layout: One level Home Equipment: Conservation officer, nature (2 wheels);Cane - single point;Wheelchair - manual      Prior Function Prior Level of Function : Needs assist             Mobility Comments: uses cane for household ambulation ocassionally, drives locally ADLs Comments: independent in ADLs, wife does the majority of iADLs including meds management     Hand Dominance   Dominant Hand: Right    Extremity/Trunk Assessment   Upper Extremity Assessment Upper Extremity Assessment: Defer to OT evaluation    Lower Extremity Assessment Lower Extremity Assessment: Generalized weakness    Cervical / Trunk Assessment Cervical / Trunk Assessment: Normal  Communication   Communication: No difficulties  Cognition Arousal/Alertness: Awake/alert Behavior During Therapy: WFL for tasks assessed/performed Overall Cognitive Status: Impaired/Different from baseline Area of Impairment: Memory, Orientation, Awareness                 Orientation Level: Disoriented to, Situation   Memory: Decreased short-term memory     Awareness: Emergent   General Comments: can't remember why he is here, but able to report month and year with increased time. obvious memory deficits but follows commands consistently        General Comments General comments (skin integrity, edema, etc.): HR up to 117 with stair training        Assessment/Plan    PT Assessment Patient needs continued PT services  PT Problem List Decreased balance;Decreased mobility;Cardiopulmonary status limiting activity       PT Treatment Interventions DME instruction;Gait training;Functional mobility training;Therapeutic activities;Stair training;Therapeutic exercise;Balance training;Cognitive remediation;Patient/family education    PT Goals (Current  goals can be found in the Care Plan section)  Acute Rehab PT Goals Patient Stated Goal: go home PT Goal Formulation: All assessment and education complete, DC therapy    Frequency Min 3X/week     Co-evaluation PT/OT/SLP Co-Evaluation/Treatment: Yes Reason for Co-Treatment: Necessary to address cognition/behavior during functional activity;Other (comment) (agitation and physically combative overnight) PT goals addressed during session: Mobility/safety with mobility OT goals addressed during session: ADL's and self-care       AM-PAC PT "6 Clicks" Mobility  Outcome Measure Help needed turning from your back to your side while in a flat bed without using bedrails?: None Help needed moving from lying on your back to sitting on the side of a flat bed without using bedrails?: None Help needed moving to and from a bed to a chair (including a wheelchair)?: None Help needed standing up from a chair using your arms (e.g., wheelchair or bedside chair)?: None Help needed to walk in hospital room?: None Help needed climbing 3-5 steps with a railing? : A Little 6 Click Score: 23    End of Session Equipment Utilized During Treatment: Gait belt Activity Tolerance: Patient tolerated treatment well Patient left: in bed;with call bell/phone within reach;with bed alarm set Nurse Communication: Mobility status PT Visit Diagnosis: Unsteadiness on feet (R26.81);Muscle weakness (generalized) (M62.81)    Time: [id number] PT Time Calculation (min) (ACUTE ONLY): 21 min   Charges:   PT Evaluation $PT Eval Moderate Complexity: 1 Mod  Legacie Dillingham B. Migdalia Dk PT, DPT Acute Rehabilitation Services Please use secure chat or  Call Office [phone]   [place] 06/29/2022, 4:29 PM

## 2022-06-29 NOTE — TOC Transition Note (Signed)
Transition of Care (TOC) - CM/SW Discharge Note Marvetta Gibbons RN, BSN Transitions of Care Unit 4E- RN Case Manager See Treatment Team for direct phone #   Patient Details  Name: Timothy Arias MRN: [id number] Date of Birth: 12-Dec-1935  Transition of Care [facility]) CM/SW Contact:  Dawayne Patricia, RN Phone Number: 06/29/2022, 4:40 PM   Clinical Narrative:    Pt stable for transition home today, PT/OT evals with no f/u recommendations made. Plan to return home w/ wife.  No TOC needs noted for discharge, pt to follow instructions per AVS   Final next level of care: Home/Self Care Barriers to Discharge: Barriers Resolved   Patient Goals and CMS Choice CMS Medicare.gov Compare Post Acute Care list provided to:: Patient    Discharge Placement                 Home        Discharge Plan and Services Additional resources added to the After Visit Summary for     Discharge Planning Services: CM Consult Post Acute Care Choice: NA          DME Arranged: N/A DME Agency: NA       HH Arranged: NA HH Agency: NA        Social Determinants of Health (SDOH) Interventions SDOH Screenings   Food Insecurity: No Food Insecurity (06/27/2022)  Housing: Low Risk  (06/27/2022)  Transportation Needs: No Transportation Needs (06/27/2022)  Utilities: Not At Risk (06/27/2022)  Tobacco Use: Medium Risk (06/28/2022)     Readmission Risk Interventions     No data to display

## 2022-06-29 NOTE — Evaluation (Signed)
Occupational Therapy Evaluation/Discharge Patient Details Name: Timothy Arias MRN: [id number] DOB: 07-16-1935 Today's Date: 06/29/2022   History of Present Illness Pt is an 87 y/o male admitted with R LE ischemia. Underwent mechanical thrombectomy of RLE on 3/5. PMH: BPH, CAD, ED, HLD, HTN, MI   Clinical Impression   PTA, pt lives with spouse, typically Independent with ADLs/mobility and has assist for IADLs including med mgmt. Pt presents now A&Ox3 and appears close to reported baseline though with cognitive deficits noted. Overall, pt required min guard for safety with mobility, initially using RW but able to progress to no AD without LOB. Pt also able to manage a flight of stairs with limited assist (see PT eval). Supervision needed for ADLs for occasional cognitive cues though overall functional to for planned DC home today. Anticipate once pt in familiar environment that he will quickly return to full PLOF without need for OT follow up.       Recommendations for follow up therapy are one component of a multi-disciplinary discharge planning process, led by the attending physician.  Recommendations may be updated based on patient status, additional functional criteria and insurance authorization.   Follow Up Recommendations  No OT follow up     Assistance Recommended at Discharge Intermittent Supervision/Assistance  Patient can return home with the following Assistance with cooking/housework;Direct supervision/assist for medications management;Direct supervision/assist for financial management;Assist for transportation;Help with stairs or ramp for entrance    Functional Status Assessment  Patient has not had a recent decline in their functional status  Equipment Recommendations  None recommended by OT    Recommendations for Other Services       Precautions / Restrictions Precautions Precautions: Fall Restrictions Weight Bearing Restrictions: No      Mobility Bed  Mobility Overal bed mobility: Needs Assistance Bed Mobility: Supine to Sit     Supine to sit: Min guard     General bed mobility comments: pt able to pull self up easily with handheld assist, tactile cues to advance LEs    Transfers Overall transfer level: Needs assistance Equipment used: Rolling walker (2 wheels) Transfers: Sit to/from Stand Sit to Stand: Min guard                  Balance Overall balance assessment: Needs assistance Sitting-balance support: No upper extremity supported, Feet supported Sitting balance-Leahy Scale: Good     Standing balance support: No upper extremity supported, During functional activity Standing balance-Leahy Scale: Good Standing balance comment: able to progress to mobilize without AD                           ADL either performed or assessed with clinical judgement   ADL Overall ADL's : Needs assistance/impaired Eating/Feeding: Independent   Grooming: Supervision/safety;Standing   Upper Body Bathing: Supervision/ safety   Lower Body Bathing: Supervison/ safety   Upper Body Dressing : Supervision/safety   Lower Body Dressing: Supervision/safety   Toilet Transfer: Min guard;Ambulation   Toileting- Clothing Manipulation and Hygiene: Supervision/safety       Functional mobility during ADLs: Min guard General ADL Comments: min guard for safety, trialed RW and progressed away from AD without LOB. Pt able to manage stairs w/ PT without safety concerns.     Vision Ability to See in Adequate Light: 0 Adequate Patient Visual Report: No change from baseline Vision Assessment?: No apparent visual deficits     Perception     Praxis  Pertinent Vitals/Pain Pain Assessment Pain Assessment: Faces Faces Pain Scale: Hurts a little bit Pain Location: R knee Pain Descriptors / Indicators: Sore Pain Intervention(s): Monitored during session     Hand Dominance Right   Extremity/Trunk Assessment Upper  Extremity Assessment Upper Extremity Assessment: Overall WFL for tasks assessed   Lower Extremity Assessment Lower Extremity Assessment: Defer to PT evaluation   Cervical / Trunk Assessment Cervical / Trunk Assessment: Normal   Communication Communication Communication: No difficulties   Cognition Arousal/Alertness: Awake/alert Behavior During Therapy: WFL for tasks assessed/performed Overall Cognitive Status: Impaired/Different from baseline Area of Impairment: Memory, Orientation, Awareness                 Orientation Level: Disoriented to, Situation   Memory: Decreased short-term memory     Awareness: Emergent   General Comments: can't remember why he is here, but able to report month and year with increased time. obvious memory deficits but follows commands consistently     General Comments  HR up to 117s    Exercises     Shoulder Instructions      Home Living Family/patient expects to be discharged to:: Private residence Living Arrangements: Spouse/significant other Available Help at Discharge: Family;Available 24 hours/day Type of Home: House Home Access: Stairs to enter [employer] of Steps: 5 Entrance Stairs-Rails: Right;Left Home Layout: One level     Bathroom Shower/Tub: Astronomer Accessibility: Yes   Home Equipment: Conservation officer, nature (2 wheels);Cane - single point;Wheelchair - manual          Prior Functioning/Environment Prior Level of Function : Needs assist             Mobility Comments: uses cane for household ambulation ocassionally, drives locally ADLs Comments: independent in ADLs, wife does the majority of iADLs including meds management        OT Problem List: Decreased cognition      OT Treatment/Interventions:      OT Goals(Current goals can be found in the care plan section) Acute Rehab OT Goals Patient Stated Goal: home soon OT Goal Formulation: All assessment and education complete,  DC therapy  OT Frequency:      Co-evaluation PT/OT/SLP Co-Evaluation/Treatment: Yes Reason for Co-Treatment: Necessary to address cognition/behavior during functional activity;Other (comment) (agitation and physically combative overnight)   OT goals addressed during session: ADL's and self-care      AM-PAC OT "6 Clicks" Daily Activity     Outcome Measure Help from another person eating meals?: None Help from another person taking care of personal grooming?: A Little Help from another person toileting, which includes using toliet, bedpan, or urinal?: A Little Help from another person bathing (including washing, rinsing, drying)?: A Little Help from another person to put on and taking off regular upper body clothing?: A Little Help from another person to put on and taking off regular lower body clothing?: A Little 6 Click Score: 19   End of Session Equipment Utilized During Treatment: Gait belt;Rolling walker (2 wheels) Nurse Communication: Mobility status  Activity Tolerance: Patient tolerated treatment well Patient left: in bed;with call bell/phone within reach;with bed alarm set;Other (comment) (EOB with breakfast)  OT Visit Diagnosis: Other symptoms and signs involving cognitive function                Time: [id number] OT Time Calculation (min): 21 min Charges:  OT General Charges $OT Visit: 1 Visit OT Evaluation $OT Eval Moderate Complexity: 1 Mod  Wasil Wolke B, OTR/L Acute  Rehab Services Office: [phone]   Layla Maw 06/29/2022, 11:33 AM

## 2022-06-29 NOTE — Progress Notes (Addendum)
  Progress Note    06/29/2022 7:45 AM 2 Days Post-Op  Subjective:  no complaints   Vitals:   06/29/22 0300 06/29/22 0739  BP: (!) 157/68 134/70  Pulse: 72 76  Resp: 18 15  Temp: 98.2 F (36.8 C) 98.6 F (37 C)  SpO2: 98% 98%   Physical Exam: Lungs:  non labored Incisions:  L groin ecchymosis but no firm hematoma Extremities:  palpable DP pulses R>L Neurologic: A&O  CBC    Component Value Date/Time   WBC 9.5 06/29/2022 0122   RBC 3.65 (L) 06/29/2022 0122   HGB 11.2 (L) 06/29/2022 0122   HCT 33.4 (L) 06/29/2022 0122   PLT 129 (L) 06/29/2022 0122   MCV 91.5 06/29/2022 0122   MCH 30.7 06/29/2022 0122   MCHC 33.5 06/29/2022 0122   RDW 12.9 06/29/2022 0122   LYMPHSABS 2.7 06/26/2022 0135   MONOABS 0.8 06/26/2022 0135   EOSABS 0.0 06/26/2022 0135   BASOSABS 0.0 06/26/2022 0135    BMET    Component Value Date/Time   NA 137 06/27/2022 0431   K 3.7 06/27/2022 0431   CL 109 06/27/2022 0431   CO2 23 06/27/2022 0431   GLUCOSE 117 (H) 06/27/2022 0431   BUN 12 06/27/2022 0431   CREATININE 1.17 06/27/2022 0431   CALCIUM 8.8 (L) 06/27/2022 0431   GFRNONAA >60 06/27/2022 0431   GFRAA >60 06/09/2019 1705    INR    Component Value Date/Time   INR 1.0 06/26/2022 0135     Intake/Output Summary (Last 24 hours) at 06/29/2022 0745 Last data filed at 06/29/2022 0305 Gross per 24 hour  Intake 480 ml  Output 370 ml  Net 110 ml     Assessment/Plan:  87 y.o. male is s/p thrombolysis of right lower extremity with right iliac stenting as well as left iliac stenting 2 Days Post-Op   Bilateral lower extremities are well-perfused with palpable DP pulses Left groin with ecchymosis however no firm hematoma If ambulating okay with PT/OT this morning, plan will be to discharge home this afternoon.  He will be prescribed Plavix in addition to aspirin.  He will follow-up in 1 month with a aortoiliac duplex and ABIs    Dagoberto Ligas, PA-C [facility] [phone] 06/29/2022 7:45 AM  I have seen and evaluated the patient. I agree with the PA note as documented above.  Patient admitted on Sunday night with occluded right iliac stents previously placed by Dr. Stanford Breed for claudication.  Underwent thrombolysis and on thrombolytics catheter check got relining of his right iliac artery stents with additional stenting as well as a left common iliac stent.  He had some ecchymosis in the left groin but no significant hematoma today.  Palpable pedal pulses.  I added Plavix yesterday to his aspirin regimen as his Plavix was previously stopped.  Hopefully discharge today after he works with therapy.  Plan follow-up in 1 month with aortoiliac duplex and ABIs on Dr. Luan Pulling clinic today.    Marty Heck, MD [facility] [place] Office: [phone]

## 2022-06-29 NOTE — Care Management Important Message (Signed)
Important Message  Patient Details  Name: Timothy Arias MRN: [id number] Date of Birth: 09/03/1935   Medicare Important Message Given:  Yes     Shelda Altes 06/29/2022, 8:50 AM

## 2022-06-29 NOTE — Progress Notes (Signed)
PHARMACIST LIPID MONITORING   Timothy Arias is a 87 y.o. male admitted on 06/26/2022 with LE arterial occlusion.  Pharmacy has been consulted to optimize lipid-lowering therapy with the indication of secondary prevention for clinical ASCVD.  Recent Labs:  Lipid Panel (last 6 months):   Lab Results  Component Value Date   CHOL 208 (H) 06/28/2022   TRIG 130 06/28/2022   HDL 39 (L) 06/28/2022   CHOLHDL 5.3 06/28/2022   VLDL 26 06/28/2022   LDLCALC 143 (H) 06/28/2022    Hepatic function panel (last 6 months):   Lab Results  Component Value Date   AST 26 06/26/2022   ALT 16 06/26/2022   ALKPHOS 96 06/26/2022   BILITOT 1.2 06/26/2022    SCr (since admission):   Serum creatinine: 1.17 mg/dL 06/27/22 0431 Estimated creatinine clearance: 58.7 mL/min  Current therapy and lipid therapy tolerance Current lipid-lowering therapy: none ordered Previous lipid-lowering therapies (if applicable): Atorvastatin '40mg'$  PTA Documented or reported allergies or intolerances to lipid-lowering therapies (if applicable): N/A  Assessment:   Patient agrees with changes to lipid-lowering therapy  Plan:    1.Statin intensity (high intensity recommended for all patients regardless of the LDL):  Add or increase statin to high intensity. Patient agreed to increase to Atorvastatin '80mg'$  daily.   2.Add ezetimibe (if any one of the following):   Not indicated at this time.  3.Refer to lipid clinic:   No  4.Follow-up with:  Primary care provider - Seward Carol, MD  5.Follow-up labs after discharge:  Changes in lipid therapy were made. Check a lipid panel in 8-12 weeks then annually.      Sloan Leiter, PharmD, BCPS, BCCCP Clinical Pharmacist Please refer to [facility] for [place] numbers 06/29/2022, 10:50 AM

## 2022-06-30 ENCOUNTER — Telehealth: Payer: Self-pay | Admitting: Physician Assistant

## 2022-06-30 NOTE — Telephone Encounter (Signed)
-----   Message from Dagoberto Ligas, PA-C sent at 06/29/2022  7:47 AM EST -----  4-6 weeks with Aorto/iliac duplex and ABIs with [facility] or PA.  PO bilateral iliac stenting

## 2022-07-03 NOTE — Telephone Encounter (Signed)
Appt has been scheduled.

## 2022-07-03 NOTE — Discharge Summary (Signed)
Discharge Summary  Patient ID: Timothy Arias [id number] 86 y.o. [birth year]  Admit date: 06/26/2022  Discharge date and time: 06/29/2022  1:25 PM   Admitting Physician: Broadus John, MD   Discharge Physician: Dr. Carlis Abbott  Admission Diagnoses: Ischemic leg [I99.8] Acute lower limb ischemia [I99.8]  Discharge Diagnoses: same  Admission Condition: poor  Discharged Condition: fair  Indication for Admission: acute limb ischemia  Hospital Course: Mr. Timothy Arias is an 87 year old male who presented to the hospital with acute limb ischemia of the right lower extremity.  He was brought to the [facility] lab and underwent initiation of thrombolysis by Dr. Unk Lightning on 06/26/2022.  He was kept overnight in the ICU.  He was brought back to the [facility] lab on 06/27/2022 for lysis recheck as well as mechanical thrombectomy of the right iliac artery system and stenting of the right common and external iliac artery as well as stenting of the left common iliac artery by Dr. Trula Slade.  He tolerated the procedure well and was kept in the hospital.  Postoperatively, hospital stay consisted of increasing mobility.  This was delayed due to patient having dementia and requiring sedation due to agitation.  He eventually passed physical therapy and Occupational Therapy and was ready for discharge home.  At the time of discharge he had palpable DP pulses right greater than left.  He will need to be on aspirin and Plavix daily.  He will follow-up in 1 month with aortoiliac duplex and ABIs on a Dr. Stanford Breed clinic today.  He was discharged home in stable condition.  Consults: None  Treatments: surgery: Initiation of right lower extremity thrombolysis by Dr. Unk Lightning on 06/26/2022 Lysis recheck with mechanical thrombectomy of right iliac artery and stenting of the right common and external iliac artery as well as stenting of the left common iliac artery by Dr. Trula Slade on 06/27/2022  Discharge Exam: See progress note 06/29/22 Vitals:    06/29/22 0739 06/29/22 1109  BP: 134/70 128/61  Pulse: 76 71  Resp: 15 16  Temp: 98.6 F (37 C) 98.5 F (36.9 C)  SpO2: 98% 97%     Disposition: Discharge disposition: 01-Home or Self Care       Patient Instructions:  Allergies as of 06/29/2022   No Known Allergies      Medication List     TAKE these medications    amLODipine 5 MG tablet Commonly known as: NORVASC Take 5 mg by mouth daily.   aspirin 325 MG tablet Take 325 mg by mouth daily.   citalopram 20 MG tablet Commonly known as: CELEXA Take 20 mg by mouth daily.   clopidogrel 75 MG tablet Commonly known as: PLAVIX Take 1 tablet (75 mg total) by mouth daily. What changed: See the new instructions.   fluocinonide 0.05 % external solution Commonly known as: LIDEX Apply 1 application. topically daily as needed (eczema).   fluticasone 50 MCG/ACT nasal spray Commonly known as: FLONASE Place 1 spray into both nostrils daily as needed for congestion.   galantamine 8 MG 24 hr capsule Commonly known as: RAZADYNE ER Take 2 capsules (16 mg total) by mouth at bedtime. What changed: when to take this   memantine 10 MG tablet Commonly known as: Namenda Take 1 tablet (10 mg total) by mouth 2 (two) times daily.   metoprolol succinate 50 MG 24 hr tablet Commonly known as: TOPROL-XL Take 50 mg by mouth daily.   multivitamin with minerals Tabs tablet Take 1 tablet by mouth at bedtime.  Vitamin D3 50 MCG (2000 UT) Caps Generic drug: Cholecalciferol Take 2,000 Units by mouth at bedtime.       Activity: activity as tolerated Diet: regular diet Wound Care: none needed  Follow-up with [facility] in 1 month.  Signed: Dagoberto Ligas, PA-C 07/03/2022 8:52 AM [facility] Office: [phone]

## 2022-07-28 ENCOUNTER — Ambulatory Visit (HOSPITAL_COMMUNITY)
Admission: RE | Admit: 2022-07-28 | Discharge: 2022-07-28 | Disposition: A | Discharge status: Home / Self Care | Payer: HMO | Source: Ambulatory Visit | Attending: Vascular Surgery | Admitting: Vascular Surgery

## 2022-07-28 ENCOUNTER — Ambulatory Visit (INDEPENDENT_AMBULATORY_CARE_PROVIDER_SITE_OTHER)
Admission: RE | Admit: 2022-07-28 | Discharge: 2022-07-28 | Disposition: A | Discharge status: Home / Self Care | Payer: HMO | Source: Ambulatory Visit | Attending: Vascular Surgery | Admitting: Vascular Surgery

## 2022-07-28 DIAGNOSIS — I739 Peripheral vascular disease, unspecified: Secondary | ICD-10-CM

## 2022-07-28 DIAGNOSIS — I70213 Atherosclerosis of native arteries of extremities with intermittent claudication, bilateral legs: Secondary | ICD-10-CM | POA: Diagnosis not present

## 2022-07-28 LAB — VAS US ABI WITH/WO TBI
Left ABI: 1.04
Right ABI: 1.05

## 2022-08-01 ENCOUNTER — Encounter: Payer: Self-pay | Admitting: Physician Assistant

## 2022-08-01 ENCOUNTER — Ambulatory Visit (INDEPENDENT_AMBULATORY_CARE_PROVIDER_SITE_OTHER): Payer: HMO | Admitting: Physician Assistant

## 2022-08-01 VITALS — BP 127/75 | HR 56 | Temp 98.0°F | Resp 18 | Ht 72.0 in | Wt 221.3 lb

## 2022-08-01 DIAGNOSIS — I739 Peripheral vascular disease, unspecified: Secondary | ICD-10-CM

## 2022-08-01 DIAGNOSIS — I70213 Atherosclerosis of native arteries of extremities with intermittent claudication, bilateral legs: Secondary | ICD-10-CM | POA: Diagnosis not present

## 2022-08-01 NOTE — Progress Notes (Signed)
Office Note   History of Present Illness   Timothy Arias is a 87 y.o. ([birth year]) male who presents for follow up.  He recently developed acute right lower extremity ischemia which required initiation of thrombolytics on 06/26/2022.  The next day he underwent mechanical thrombectomy and stenting of the right common iliac and external iliac arteries.  He also underwent left common iliac artery stenting for an incidental dissection finding. He has a history of right common and external iliac artery stenting on 08/19/2021 by Dr. Lenell Antu.   He returns today for follow up.  He has a history of dementia and his history is hard to follow.  The patient's wife is present and provides most of the history.  She denies any recurrent events of extreme lower extremity pain or coldness.  She states that he is not walking much and not drinking much water.  He prefers to drink sweet tea or sodas.  When asked the patient why he is not walking much, he does not know why. He then states sometimes his right leg hurts when he walks. He is using a foot pedal machine sometimes when sitting down.  He denies any rest pain or tissue loss of the lower extremities.  He is still taking his aspirin, plavix, and statin.  Current Outpatient Medications  Medication Sig Dispense Refill   amLODipine (NORVASC) 5 MG tablet Take 5 mg by mouth daily.     aspirin 325 MG tablet Take 325 mg by mouth daily.     atorvastatin (LIPITOR) 80 MG tablet Take 1 tablet (80 mg total) by mouth daily. 30 tablet 11   Cholecalciferol (VITAMIN D3) 50 MCG (2000 UT) capsule Take 2,000 Units by mouth at bedtime.     citalopram (CELEXA) 20 MG tablet Take 20 mg by mouth daily.     clopidogrel (PLAVIX) 75 MG tablet Take 1 tablet (75 mg total) by mouth daily. 30 tablet 3   fluocinonide (LIDEX) 0.05 % external solution Apply 1 application. topically daily as needed (eczema).     fluticasone (FLONASE) 50 MCG/ACT nasal spray Place 1 spray into both nostrils  daily as needed for congestion.     galantamine (RAZADYNE ER) 8 MG 24 hr capsule Take 2 capsules (16 mg total) by mouth at bedtime. (Patient taking differently: Take 16 mg by mouth daily at 12 noon.) 180 capsule 4   memantine (NAMENDA) 10 MG tablet Take 1 tablet (10 mg total) by mouth 2 (two) times daily. 180 tablet 4   metoprolol succinate (TOPROL-XL) 50 MG 24 hr tablet Take 50 mg by mouth daily.     Multiple Vitamin (MULTIVITAMIN WITH MINERALS) TABS Take 1 tablet by mouth at bedtime.     No current facility-administered medications for this visit.    REVIEW OF SYSTEMS (negative unless checked):   Cardiac:  []  Chest pain or chest pressure? []  Shortness of breath upon activity? []  Shortness of breath when lying flat? []  Irregular heart rhythm?  Vascular:  [x]  Pain in calf, thigh, or hip brought on by walking? []  Pain in feet at night that wakes you up from your sleep? []  Blood clot in your veins? []  Leg swelling?  Pulmonary:  []  Oxygen at home? []  Productive cough? []  Wheezing?  Neurologic:  []  Sudden weakness in arms or legs? []  Sudden numbness in arms or legs? []  Sudden onset of difficult speaking or slurred speech? []  Temporary loss of vision in one eye? []  Problems with dizziness?  Gastrointestinal:  []   Blood in stool? []  Vomited blood?  Genitourinary:  []  Burning when urinating? []  Blood in urine?  Psychiatric:  []  Major depression  Hematologic:  []  Bleeding problems? []  Problems with blood clotting?  Dermatologic:  []  Rashes or ulcers?  Constitutional:  []  Fever or chills?  Ear/Nose/Throat:  []  Change in hearing? []  Nose bleeds? []  Sore throat?  Musculoskeletal:  []  Back pain? []  Joint pain? []  Muscle pain?   Physical Examination   Vitals:   08/01/22 1430  BP: 127/75  Pulse: (!) 56  Resp: 18  Temp: 98 F (36.7 C)  TempSrc: Temporal  SpO2: 98%  Weight: 221 lb 4.8 oz (100.4 kg)  Height: 6' (1.829 m)   Body mass index is 30.01  kg/m.  General:  WDWN in NAD; vital signs documented above Gait: Not observed HENT: WNL, normocephalic Pulmonary: normal non-labored breathing Cardiac: Regular Abdomen: soft, NT, no masses Skin: without rashes Vascular Exam/Pulses: 2+ DP pulses bilaterally Extremities: without ischemic changes, without gangrene , without cellulitis; without open wounds;  Musculoskeletal: no muscle wasting or atrophy  Neurologic: A&O X 3;  No focal weakness or paresthesias are detected Psychiatric:  The pt has Normal affect.  Non-Invasive Vascular imaging   ABI (07/28/2022) R:  ABI: 1.05 (0.95),  PT: tri DP: tri TBI:  0.61 L:  ABI: 1.04 (1.21),  PT: tri DP: tri TBI: 0.62  Aortoiliac Duplex (07/28/2022) Patent right CIA and EIA stents without stenosis.  Patent left CIA stent without stenosis.  Medical Decision Making   Timothy Arias is a 87 y.o. male who presents for PAD follow up   He recently underwent thrombolysis and mechanical thrombectomy of his right common and external iliac arteries, with right common iliac, right external iliac, and left common iliac stenting on 06/27/2022.  This was done for acute occlusion of his right iliac stents Studies performed on 4/5 demonstrated stable ABIs in bilateral lower extremities.  Right ABI is 1.05 with triphasic PT/DP flow.  Left ABI is 1.04 with triphasic PT/DP flow.  Aortoiliac duplex demonstrates patent right common iliac and external iliac artery stents without stenosis.  There is also a patent left common iliac artery stent without stenosis The patient's self-reported history is hard to follow due to his baseline dementia.  He endorses occasional pain in his right leg when walking.  His wife states that he is not walking much.  Prior to his thrombectomy and stenting, it seems like he was not walking much at baseline.  He denies any rest pain or tissue loss.  On exam he has palpable femoral pulses.  He also has palpable DP pulses 2+ I have encouraged  the patient to try to increase his walking to protect his blood flow.  I have also encouraged him to start drinking more water and avoid excessive drinking of sweet tea and sodas.  He can continue to take his aspirin, Plavix, and statin.  He can follow-up with our office in 6 months with repeat ABIs and bilateral aortoiliac duplex   Loel Dubonnet PA-C [facility] [place] Office: [phone]  Clinic MD: Steve Rattler

## 2022-08-04 ENCOUNTER — Other Ambulatory Visit: Payer: Self-pay

## 2022-08-04 DIAGNOSIS — I739 Peripheral vascular disease, unspecified: Secondary | ICD-10-CM

## 2022-08-04 DIAGNOSIS — I70213 Atherosclerosis of native arteries of extremities with intermittent claudication, bilateral legs: Secondary | ICD-10-CM

## 2022-09-08 ENCOUNTER — Other Ambulatory Visit: Payer: Self-pay

## 2022-09-08 ENCOUNTER — Emergency Department (HOSPITAL_COMMUNITY): Payer: HMO

## 2022-09-08 ENCOUNTER — Encounter (HOSPITAL_COMMUNITY): Payer: Self-pay

## 2022-09-08 ENCOUNTER — Inpatient Hospital Stay (HOSPITAL_COMMUNITY)
Admission: EM | Admit: 2022-09-08 | Discharge: 2022-09-13 | DRG: 085 | Disposition: A | Discharge status: Home Health | Payer: HMO | Attending: Neurology | Admitting: Neurology

## 2022-09-08 DIAGNOSIS — I252 Old myocardial infarction: Secondary | ICD-10-CM

## 2022-09-08 DIAGNOSIS — R4189 Other symptoms and signs involving cognitive functions and awareness: Secondary | ICD-10-CM | POA: Diagnosis present

## 2022-09-08 DIAGNOSIS — S065X0A Traumatic subdural hemorrhage without loss of consciousness, initial encounter: Secondary | ICD-10-CM | POA: Diagnosis present

## 2022-09-08 DIAGNOSIS — Z7982 Long term (current) use of aspirin: Secondary | ICD-10-CM | POA: Diagnosis not present

## 2022-09-08 DIAGNOSIS — E669 Obesity, unspecified: Secondary | ICD-10-CM | POA: Diagnosis present

## 2022-09-08 DIAGNOSIS — Y92014 Private driveway to single-family (private) house as the place of occurrence of the external cause: Secondary | ICD-10-CM

## 2022-09-08 DIAGNOSIS — R471 Dysarthria and anarthria: Secondary | ICD-10-CM | POA: Diagnosis not present

## 2022-09-08 DIAGNOSIS — Z95820 Peripheral vascular angioplasty status with implants and grafts: Secondary | ICD-10-CM | POA: Diagnosis not present

## 2022-09-08 DIAGNOSIS — Y9301 Activity, walking, marching and hiking: Secondary | ICD-10-CM | POA: Diagnosis present

## 2022-09-08 DIAGNOSIS — F03A11 Unspecified dementia, mild, with agitation: Secondary | ICD-10-CM | POA: Diagnosis not present

## 2022-09-08 DIAGNOSIS — R402412 Glasgow coma scale score 13-15, at arrival to emergency department: Secondary | ICD-10-CM | POA: Diagnosis present

## 2022-09-08 DIAGNOSIS — I614 Nontraumatic intracerebral hemorrhage in cerebellum: Secondary | ICD-10-CM

## 2022-09-08 DIAGNOSIS — Z8719 Personal history of other diseases of the digestive system: Secondary | ICD-10-CM | POA: Diagnosis not present

## 2022-09-08 DIAGNOSIS — S065XAA Traumatic subdural hemorrhage with loss of consciousness status unknown, initial encounter: Secondary | ICD-10-CM | POA: Diagnosis not present

## 2022-09-08 DIAGNOSIS — E441 Mild protein-calorie malnutrition: Secondary | ICD-10-CM | POA: Diagnosis present

## 2022-09-08 DIAGNOSIS — Z6830 Body mass index (BMI) 30.0-30.9, adult: Secondary | ICD-10-CM | POA: Diagnosis not present

## 2022-09-08 DIAGNOSIS — S0181XA Laceration without foreign body of other part of head, initial encounter: Secondary | ICD-10-CM | POA: Diagnosis present

## 2022-09-08 DIAGNOSIS — W010XXA Fall on same level from slipping, tripping and stumbling without subsequent striking against object, initial encounter: Secondary | ICD-10-CM | POA: Diagnosis present

## 2022-09-08 DIAGNOSIS — S06370A Contusion, laceration, and hemorrhage of cerebellum without loss of consciousness, initial encounter: Secondary | ICD-10-CM | POA: Diagnosis present

## 2022-09-08 DIAGNOSIS — Z7902 Long term (current) use of antithrombotics/antiplatelets: Secondary | ICD-10-CM | POA: Diagnosis not present

## 2022-09-08 DIAGNOSIS — E785 Hyperlipidemia, unspecified: Secondary | ICD-10-CM | POA: Diagnosis present

## 2022-09-08 DIAGNOSIS — I2510 Atherosclerotic heart disease of native coronary artery without angina pectoris: Secondary | ICD-10-CM | POA: Diagnosis present

## 2022-09-08 DIAGNOSIS — Z79899 Other long term (current) drug therapy: Secondary | ICD-10-CM

## 2022-09-08 DIAGNOSIS — I10 Essential (primary) hypertension: Secondary | ICD-10-CM | POA: Diagnosis present

## 2022-09-08 DIAGNOSIS — N400 Enlarged prostate without lower urinary tract symptoms: Secondary | ICD-10-CM | POA: Diagnosis present

## 2022-09-08 DIAGNOSIS — I619 Nontraumatic intracerebral hemorrhage, unspecified: Secondary | ICD-10-CM | POA: Diagnosis present

## 2022-09-08 DIAGNOSIS — I6389 Other cerebral infarction: Secondary | ICD-10-CM | POA: Diagnosis not present

## 2022-09-08 DIAGNOSIS — S06360A Traumatic hemorrhage of cerebrum, unspecified, without loss of consciousness, initial encounter: Secondary | ICD-10-CM | POA: Diagnosis present

## 2022-09-08 DIAGNOSIS — D649 Anemia, unspecified: Secondary | ICD-10-CM | POA: Diagnosis present

## 2022-09-08 DIAGNOSIS — S0636AA Traumatic hemorrhage of cerebrum, unspecified, with loss of consciousness status unknown, initial encounter: Principal | ICD-10-CM

## 2022-09-08 DIAGNOSIS — Z87891 Personal history of nicotine dependence: Secondary | ICD-10-CM

## 2022-09-08 LAB — CBC
HCT: 38.8 % — ABNORMAL LOW (ref 39.0–52.0)
Hemoglobin: 12.7 g/dL — ABNORMAL LOW (ref 13.0–17.0)
MCH: 29.9 pg (ref 26.0–34.0)
MCHC: 32.7 g/dL (ref 30.0–36.0)
MCV: 91.3 fL (ref 80.0–100.0)
Platelets: 154 10*3/uL (ref 150–400)
RBC: 4.25 MIL/uL (ref 4.22–5.81)
RDW: 13.6 % (ref 11.5–15.5)
WBC: 6.9 10*3/uL (ref 4.0–10.5)
nRBC: 0 % (ref 0.0–0.2)

## 2022-09-08 LAB — URINALYSIS, ROUTINE W REFLEX MICROSCOPIC
Bilirubin Urine: NEGATIVE
Glucose, UA: NEGATIVE mg/dL
Hgb urine dipstick: NEGATIVE
Ketones, ur: 5 mg/dL — AB
Leukocytes,Ua: NEGATIVE
Nitrite: NEGATIVE
Protein, ur: NEGATIVE mg/dL
Specific Gravity, Urine: 1.019 (ref 1.005–1.030)
pH: 6 (ref 5.0–8.0)

## 2022-09-08 LAB — COMPREHENSIVE METABOLIC PANEL
ALT: 19 U/L (ref 0–44)
AST: 27 U/L (ref 15–41)
Albumin: 3.6 g/dL (ref 3.5–5.0)
Alkaline Phosphatase: 102 U/L (ref 38–126)
Anion gap: 13 (ref 5–15)
BUN: 10 mg/dL (ref 8–23)
CO2: 22 mmol/L (ref 22–32)
Calcium: 9.6 mg/dL (ref 8.9–10.3)
Chloride: 105 mmol/L (ref 98–111)
Creatinine, Ser: 1.01 mg/dL (ref 0.61–1.24)
GFR, Estimated: >60 mL/min (ref >60)
Glucose, Bld: 157 mg/dL — ABNORMAL HIGH (ref 70–99)
Potassium: 3.7 mmol/L (ref 3.5–5.1)
Sodium: 140 mmol/L (ref 135–145)
Total Bilirubin: 0.7 mg/dL (ref 0.3–1.2)
Total Protein: 6.7 g/dL (ref 6.5–8.1)

## 2022-09-08 LAB — I-STAT CHEM 8, ED
BUN: 13 mg/dL (ref 8–23)
Calcium, Ion: 1.26 mmol/L (ref 1.15–1.40)
Chloride: 104 mmol/L (ref 98–111)
Creatinine, Ser: 0.9 mg/dL (ref 0.61–1.24)
Glucose, Bld: 155 mg/dL — ABNORMAL HIGH (ref 70–99)
HCT: 39 % (ref 39.0–52.0)
Hemoglobin: 13.3 g/dL (ref 13.0–17.0)
Potassium: 3.8 mmol/L (ref 3.5–5.1)
Sodium: 140 mmol/L (ref 135–145)
TCO2: 26 mmol/L (ref 22–32)

## 2022-09-08 LAB — ETHANOL: Alcohol, Ethyl (B): <10 mg/dL (ref <10)

## 2022-09-08 LAB — MRSA NEXT GEN BY PCR, NASAL: MRSA by PCR Next Gen: NOT DETECTED

## 2022-09-08 LAB — SAMPLE TO BLOOD BANK

## 2022-09-08 MED ORDER — SENNOSIDES-DOCUSATE SODIUM 8.6-50 MG PO TABS
1.0000 | ORAL_TABLET | Freq: Two times a day (BID) | ORAL | Status: DC
  Administered 2022-09-08 – 2022-09-13 (×8): 1 via ORAL
  Filled 2022-09-08 (×8): qty 1

## 2022-09-08 MED ORDER — STROKE: EARLY STAGES OF RECOVERY BOOK
Freq: Once | Status: AC
  Filled 2022-09-08: qty 1

## 2022-09-08 MED ORDER — ACETAMINOPHEN 650 MG RE SUPP: 650.0000 mg | RECTAL | Status: DC | PRN

## 2022-09-08 MED ORDER — ORAL CARE MOUTH RINSE: 15.0000 mL | OROMUCOSAL | Status: DC | PRN

## 2022-09-08 MED ORDER — LIDOCAINE HCL (PF) 1 % IJ SOLN
30.0000 mL | Freq: Once | INTRAMUSCULAR | Status: AC
  Administered 2022-09-08: 30 mL via INTRADERMAL
  Filled 2022-09-08: qty 30

## 2022-09-08 MED ORDER — ACETAMINOPHEN 160 MG/5ML PO SOLN: 650.0000 mg | ORAL | Status: DC | PRN

## 2022-09-08 MED ORDER — CHLORHEXIDINE GLUCONATE CLOTH 2 % EX PADS
6.0000 | MEDICATED_PAD | Freq: Every day | CUTANEOUS | Status: DC
  Administered 2022-09-08 – 2022-09-11 (×4): 6 via TOPICAL

## 2022-09-08 MED ORDER — PANTOPRAZOLE SODIUM 40 MG IV SOLR
40.0000 mg | Freq: Every day | INTRAVENOUS | Status: DC
  Administered 2022-09-08 – 2022-09-09 (×2): 40 mg via INTRAVENOUS
  Filled 2022-09-08 (×2): qty 10

## 2022-09-08 MED ORDER — MORPHINE SULFATE (PF) 2 MG/ML IV SOLN
2.0000 mg | Freq: Once | INTRAVENOUS | Status: AC
  Administered 2022-09-08: 2 mg via INTRAVENOUS
  Filled 2022-09-08: qty 1

## 2022-09-08 MED ORDER — CLEVIDIPINE BUTYRATE 0.5 MG/ML IV EMUL: 0.0000 mg/h | INTRAVENOUS | Status: DC

## 2022-09-08 MED ORDER — LABETALOL HCL 5 MG/ML IV SOLN
20.0000 mg | Freq: Once | INTRAVENOUS | Status: AC
  Administered 2022-09-08: 20 mg via INTRAVENOUS
  Filled 2022-09-08: qty 4

## 2022-09-08 MED ORDER — CLEVIDIPINE BUTYRATE 0.5 MG/ML IV EMUL
INTRAVENOUS | Status: AC
  Administered 2022-09-08: 2 mg/h via INTRAVENOUS
  Filled 2022-09-08: qty 100

## 2022-09-08 MED ORDER — ACETAMINOPHEN 325 MG PO TABS
650.0000 mg | ORAL_TABLET | ORAL | Status: DC | PRN
  Administered 2022-09-09 – 2022-09-12 (×4): 650 mg via ORAL
  Filled 2022-09-08 (×4): qty 2

## 2022-09-08 NOTE — ED Triage Notes (Signed)
Was walking to the mailbox his house when he slipped per spouse and face planted on plavix spouse reports lethargy and confusion.  No LOC reported pt is not oriented to year or situation

## 2022-09-08 NOTE — H&P (Signed)
Neurology H&P  CC: Fall   History is obtained from:Patient, wife- Timothy Arias  HPI: Timothy Arias is a 87 y.o. male with a past medical history of ischemic heart disease, HTN, acute lower limb ischemia s/p stenting who initially presented as a level 2 trauma. He was walking to the mailbox when he fell. Housekeeper saw him on the ground outside and yelled for patients wife to call EMS. When she got outside he was laying on the ground with blood coming from his head. He remembers the entire event, states his legs came out from underneath him. Does not remember tripping over anything, but does not know exactly why he fell. His wife tells me that she has noticed him shuffling when he walks recently. He also has not been eating or drinking as much. She does have to tell him the date every morning. She has attributed this to his dementia. He has been taking all of his medications as prescribed.   NIHSS:  1a Level of Conscious.: 0 1b LOC Questions: 1 1c LOC Commands: 0 2 Best Gaze: 0 3 Visual: 0 4 Facial Palsy: 0 5a Motor Arm - left: 0 5b Motor Arm - Right: 0 6a Motor Leg - Left: 0 6b Motor Leg - Right: 0 7 Limb Ataxia: 0 8 Sensory: 0 9 Best Language: 0 10 Dysarthria: 0 11 Extinct and Inattention.: 0 TOTAL: 1     ROS: A complete ROS was performed and is negative except as noted in the HPI.   Past Medical History:  Diagnosis Date   BPH (benign prostatic hyperplasia)    Colon polyps    Coronary artery disease    Dyslipidemia    ED (erectile dysfunction)    Hydrocele    Hyperlipidemia    Hypertension    MI (myocardial infarction) (HCC) 1992   Nocturnal enuresis      History reviewed. No pertinent family history.  Social History:  reports that he quit smoking about 32 years ago. His smoking use included cigarettes. He has a 60.00 pack-year smoking history. He has never been exposed to tobacco smoke. He has quit using smokeless tobacco.  His smokeless tobacco use included snuff. He  reports current alcohol use. He reports that he does not use drugs.   Prior to Admission medications   Medication Sig Start Date End Date Taking? Authorizing Provider  amLODipine (NORVASC) 5 MG tablet Take 5 mg by mouth daily. 09/27/20   [provider]  aspirin 325 MG tablet Take 325 mg by mouth daily.    [provider]  atorvastatin (LIPITOR) 80 MG tablet Take 1 tablet (80 mg total) by mouth daily. 06/29/22 06/29/23  Baglia, Corrina, PA-C  Cholecalciferol (VITAMIN D3) 50 MCG (2000 UT) capsule Take 2,000 Units by mouth at bedtime.    [provider]  citalopram (CELEXA) 20 MG tablet Take 20 mg by mouth daily. 09/27/20   [provider]  clopidogrel (PLAVIX) 75 MG tablet Take 1 tablet (75 mg total) by mouth daily. 06/29/22   Emilie Rutter, PA-C  fluocinonide (LIDEX) 0.05 % external solution Apply 1 application. topically daily as needed (eczema).    [provider]  fluticasone (FLONASE) 50 MCG/ACT nasal spray Place 1 spray into both nostrils daily as needed for congestion. 09/17/14   [provider]  galantamine (RAZADYNE ER) 8 MG 24 hr capsule Take 2 capsules (16 mg total) by mouth at bedtime. Patient taking differently: Take 16 mg by mouth daily at 12 noon. 05/29/22 08/22/23  Windell Norfolk, MD  memantine (NAMENDA) 10 MG tablet Take 1 tablet (10 mg total) by mouth 2 (two) times daily. 05/29/22 08/22/23  Windell Norfolk, MD  metoprolol succinate (TOPROL-XL) 50 MG 24 hr tablet Take 50 mg by mouth daily. 09/27/20   [provider]  Multiple Vitamin (MULTIVITAMIN WITH MINERALS) TABS Take 1 tablet by mouth at bedtime.    [provider]     Exam: Current vital signs: BP (!) 169/88   Pulse 85   Temp 97.6 F (36.4 C) (Oral)   Resp 18   Ht 6' (1.829 m)   Wt 100.4 kg   SpO2 100%   BMI 30.02 kg/m    Physical Exam  Constitutional: Appears well-developed and well-nourished.  Psych: Affect appropriate to situation Eyes: No scleral  injection HENT: He has a laceration that has been sutured in the right frontotemporal region. Head: Normocephalic.  Cardiovascular: Normal rate and regular rhythm.  Respiratory: Effort normal and breath sounds normal to anterior ascultation GI: Soft.  No distension. There is no tenderness.  Skin: Laceration right forehead,   Neuro: Mental Status: Patient is awake, alert, identifies himself and his wife. States he is at [facility]. States he is 87 years old and that it is 2005.  Speech is clear. Able to identify objects, repetition intact.  Cranial Nerves: II: Visual Fields are full. Pupils are equal, round, and reactive to light.   III,IV, VI: EOMI without ptosis or diplopia.  V: Facial sensation is symmetric to light touch VII: Face is symmetric resting and smiling.  VIII: Hearing is intact to voice X: Palate is midline and palate elevates symmetrically, phonation intact XI: Shoulder shrug is symmetric. XII: tongue is midline without atrophy or fasciculations.  Motor: Tone is normal. Bulk is normal. 5/5 strength was present in all four extremities.  Sensory: Sensation is symmetric to light touch in the arms and legs.  Cerebellar: FNF and HKS are intact bilaterally Tremor noted in bilateral upper extremities    I have reviewed labs in epic and the pertinent results are:   I have reviewed the images obtained: CT Head 1. Acute right subdural hemorrhage along the right cerebral convexity measuring 5.8 mm in maximum thickness. A 7.8 x 6.1 x 10.9 mm intraparenchymal hemorrhage in the right middle cerebellar peduncle. 2. No acute osseous injury of the cervical spine. 3. Cervical spine spondylosis as described above.  Plan: Cerebellum ICH, nontraumatic SDH  Acuity: Acute Laterality: Right Current suspected etiology:  IPH - Hypertensive SDH- traumatic Treatment: BP management, hold plavix -Admit to Neuro ICU -CT head without contrast tomorrow morning  -BP control  goal SYS< 130-150  for first 24 hours, then <160 -NSGY  -PT/OT/ST  -neuromonitoring  CNS -NSGY consult  -Close neuro monitoring -Dementia- on Namenda and galantamine  -NPO until cleared by speech -ST -Continue PT/OT/ST  RESP Monitor  CV Essential (primary) hypertension -Aggressive BP control, goal SBP < 130-150 -IV Cleviprex ordered  -Labetalol  -TTE  GI/GU -Gentle hydration  HEME -Monitor -transfuse for hgb < 7  ENDO -goal HgbA1c < 7  Fluid/Electrolyte Disorders -Replete -Repeat labs  ID -Monitor  Nutrition E66.9 Obesity  E46 Protein-Calorie Malnutrition Mild Moderate Severe -diet consult  Prophylaxis DVT: SCDs GI: Protonix Bowel: Senna  Dispo: Pending  Diet: NPO until cleared by speech  Code Status: Full Code     THE FOLLOWING WERE PRESENT ON ADMISSION: CNS - ICH, dementia  Skin- Laceration on right forehead   Patient seen and examined by  NP/APP with MD. MD to update note as needed.   Elmer Picker, DNP, FNP-BC [facility] Pager: [phone]   I have seen the patient and reviewed the above note.  Mr. Vanwagoner fell, stating that he just felt like he tripped, has not experienced any vertigo or disequilibrium.  He has a subdural hematoma as well as a small intraparenchymal bleed.  It is unclear if the intraparenchymal bleed contributed to the fall as it is not typical traumatic location.  We are currently treating as a nontraumatic intraparenchymal bleed with a subsequent traumatic subdural bleed.  He will need to be monitored in the ICU overnight, especially given his need for blood pressure control with clevidipine.  This patient is critically ill and at significant risk of neurological worsening, death and care requires constant monitoring of vital signs, hemodynamics,respiratory and cardiac monitoring, neurological assessment, discussion with family, other specialists and medical decision making of high complexity. I spent 35  minutes of neurocritical care time  in the care of  this patient. This was time spent independent of any time provided by nurse practitioner or PA.  Ritta Slot, MD [facility] [phone]  If 7pm- 7am, please page neurology on call as listed in AMION. 09/08/2022  8:38 PM

## 2022-09-08 NOTE — ED Notes (Signed)
ED TO INPATIENT HANDOFF REPORT  ED Nurse Name and Phone #:  Dakarai Mcglocklin [phone]  S Name/Age/Gender Timothy Arias 87 y.o. male Room/Bed: 025C/025C  Code Status   Code Status: Full Code  Home/SNF/Other Home Patient oriented to: self, place Is this baseline? No   Triage Complete: Triage complete  Chief Complaint ICH (intracerebral hemorrhage) (HCC) [I61.9]  Triage Note Was walking to the mailbox his house when he slipped per spouse and face planted on plavix spouse reports lethargy and confusion.  No LOC reported pt is not oriented to year or situation    Allergies No Known Allergies  Level of Care/Admitting Diagnosis ED Disposition     ED Disposition  Admit   Condition  --   Comment  Hospital Area: [facility] [[id number]]  Level of Care: ICU [6]  May admit patient to [facility] or [facility] if equivalent level of care is available:: No  Covid Evaluation: Asymptomatic - no recent exposure (last 10 days) testing not required  Diagnosis: ICH (intracerebral hemorrhage) [facility]) [[id number]]  Admitting Physician: Rejeana Brock [[id number]]  Attending Physician: Amada Jupiter, MCNEILL P [[id number]]  Certification:: I certify there are rare and unusual circumstances requiring inpatient admission  Estimated Length of Stay: 4          B Medical/Surgery History Past Medical History:  Diagnosis Date   BPH (benign prostatic hyperplasia)    Colon polyps    Coronary artery disease    Dyslipidemia    ED (erectile dysfunction)    Hydrocele    Hyperlipidemia    Hypertension    MI (myocardial infarction) (HCC) 1992   Nocturnal enuresis    Past Surgical History:  Procedure Laterality Date   ABDOMINAL AORTOGRAM W/LOWER EXTREMITY Bilateral 08/19/2021   Procedure: ABDOMINAL AORTOGRAM W/LOWER EXTREMITY;  Surgeon: Leonie Douglas, MD;  Location: [facility] INVASIVE CV LAB;  Service: Cardiovascular;  Laterality: Bilateral;   COLONOSCOPY W/ POLYPECTOMY     CORONARY ANGIOPLASTY      with stents   KNEE ARTHROSCOPY     PERIPHERAL VASCULAR INTERVENTION  08/19/2021   Procedure: PERIPHERAL VASCULAR INTERVENTION;  Surgeon: Leonie Douglas, MD;  Location: [facility] INVASIVE CV LAB;  Service: Cardiovascular;;  Right Iliac   PERIPHERAL VASCULAR INTERVENTION Bilateral 06/27/2022   Procedure: PERIPHERAL VASCULAR INTERVENTION;  Surgeon: Nada Libman, MD;  Location: [facility] INVASIVE CV LAB;  Service: Cardiovascular;  Laterality: Bilateral;  [place]   PERIPHERAL VASCULAR THROMBECTOMY N/A 06/26/2022   Procedure: PERIPHERAL VASCULAR THROMBECTOMY;  Surgeon: Victorino Sparrow, MD;  Location[facility] INVASIVE CV LAB;  Service: Cardiovascular;  Laterality: N/A;   PERIPHERAL VASCULAR THROMBECTOMY N/A 06/27/2022   Procedure: LYSIS RECHECK;  Surgeon: Nada Libman, MD;  Location: [facility] INVASIVE CV LAB;  Service: Cardiovascular;  Laterality: N/A;   RECTAL VILLOUS ADENOMA EXCISION     VASECTOMY       A IV Location/Drains/Wounds Patient Lines/Drains/Airways Status     Active Line/Drains/Airways     Name Placement date Placement time Site Days   Peripheral IV 09/08/22 18 G Anterior;Distal;Left Forearm 09/08/22  1400  Forearm  less than 1            Intake/Output Last 24 hours No intake or output data in the 24 hours ending 09/08/22 1756  Labs/Imaging Results for orders placed or performed during the hospital encounter of 09/08/22 (from the past 48 hour(s))  Sample to Blood Bank     Status: None   Collection Time: 09/08/22  2:38 PM  Result  Value Ref Range   Blood Bank Specimen SAMPLE AVAILABLE FOR TESTING    Sample Expiration      09/11/2022,2359 Performed at [facility], [address], [place], [place] [address]   Comprehensive metabolic panel     Status: Abnormal   Collection Time: 09/08/22  2:39 PM  Result Value Ref Range   Sodium 140 135 - 145 mmol/L   Potassium 3.7 3.5 - 5.1 mmol/L   Chloride 105 98 - 111 mmol/L   CO2 22 22 - 32 mmol/L   Glucose, Bld 157 (H) 70 - 99 mg/dL     Comment: Glucose reference range applies only to samples taken after fasting for at least 8 hours.   BUN 10 8 - 23 mg/dL   Creatinine, Ser 1.91 0.61 - 1.24 mg/dL   Calcium 9.6 8.9 - 47.8 mg/dL   Total Protein 6.7 6.5 - 8.1 g/dL   Albumin 3.6 3.5 - 5.0 g/dL   AST 27 15 - 41 U/L   ALT 19 0 - 44 U/L   Alkaline Phosphatase 102 38 - 126 U/L   Total Bilirubin 0.7 0.3 - 1.2 mg/dL   GFR, Estimated >29 >56 mL/min    Comment: (NOTE) Calculated using the CKD-EPI Creatinine Equation (2021)    Anion gap 13 5 - 15    Comment: Performed at [facility], [address], [place], [place] [address]  CBC     Status: Abnormal   Collection Time: 09/08/22  2:39 PM  Result Value Ref Range   WBC 6.9 4.0 - 10.5 K/uL   RBC 4.25 4.22 - 5.81 MIL/uL   Hemoglobin 12.7 (L) 13.0 - 17.0 g/dL   HCT 65.7 (L) 84.6 - 96.2 %   MCV 91.3 80.0 - 100.0 fL   MCH 29.9 26.0 - 34.0 pg   MCHC 32.7 30.0 - 36.0 g/dL   RDW 95.2 84.1 - 32.4 %   Platelets 154 150 - 400 K/uL    Comment: REPEATED TO VERIFY   nRBC 0.0 0.0 - 0.2 %    Comment: Performed at [facility], [address] [place], [place] [address]  Ethanol     Status: None   Collection Time: 09/08/22  2:39 PM  Result Value Ref Range   Alcohol, Ethyl (B) <10 <10 mg/dL    Comment: (NOTE) Lowest detectable limit for serum alcohol is 10 mg/dL.  For medical purposes only. Performed at [facility], [address], [place], [place] [address]   I-Stat Chem 8, ED     Status: Abnormal   Collection Time: 09/08/22  3:09 PM  Result Value Ref Range   Sodium 140 135 - 145 mmol/L   Potassium 3.8 3.5 - 5.1 mmol/L   Chloride 104 98 - 111 mmol/L   BUN 13 8 - 23 mg/dL   Creatinine, Ser 6.44 0.61 - 1.24 mg/dL   Glucose, Bld 034 (H) 70 - 99 mg/dL    Comment: Glucose reference range applies only to samples taken after fasting for at least 8 hours.   Calcium, Ion 1.26 1.15 - 1.40 mmol/L   TCO2 26 22 - 32 mmol/L   Hemoglobin 13.3 13.0 - 17.0 g/dL   HCT 74.2  59.5 - 63.8 %   CT HEAD WO CONTRAST  Result Date: 09/08/2022 CLINICAL DATA:  Head trauma, status post fall EXAM: CT HEAD WITHOUT CONTRAST CT CERVICAL SPINE WITHOUT CONTRAST TECHNIQUE: Multidetector CT imaging of the head and cervical spine was performed  following the standard protocol without intravenous contrast. Multiplanar CT image reconstructions of the cervical spine were also generated. RADIATION DOSE REDUCTION: This exam was performed according to the departmental dose-optimization program which includes automated exposure control, adjustment of the mA and/or kV according to patient size and/or use of iterative reconstruction technique. COMPARISON:  08/16/2010 FINDINGS: CT HEAD FINDINGS Brain: No evidence of acute infarction, ventriculomegaly, or mass effect. Acute right subdural hemorrhage along the right cerebral convexity measuring 5.8 mm in maximum thickness. 7.8 x 6.1 x 10.9 mm intraparenchymal hemorrhage in the right middle cerebellar peduncle. No midline shift. Generalized cerebral atrophy. Periventricular white matter low attenuation likely secondary to microangiopathy. Vascular: Cerebrovascular atherosclerotic calcifications are noted. No hyperdense vessels. Skull: Negative for fracture or focal lesion. Sinuses/Orbits: Visualized portions of the orbits are unremarkable. Visualized portions of the paranasal sinuses are unremarkable. Visualized portions of the mastoid air cells are unremarkable. Other: Right frontal scalp hematoma. CT CERVICAL SPINE FINDINGS Alignment: Normal. Skull base and vertebrae: No acute fracture. No primary bone lesion or focal pathologic process. Soft tissues and spinal canal: No prevertebral fluid or swelling. No visible canal hematoma. Disc levels: Anterior bridging osteophytes throughout the cervicothoracic spine. 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 Mild bilateral foraminal stenosis at C3-4 and  C4-5. Bilateral foraminal stenosis at C6-7. Bilateral facet arthropathy throughout the cervical spine. Upper chest: Lung apices are clear. Thoracic aortic atherosclerosis. Other: No fluid collection or hematoma. Carotid artery atherosclerosis. IMPRESSION: 1. Acute right subdural hemorrhage along the right cerebral convexity measuring 5.8 mm in maximum thickness. A 7.8 x 6.1 x 10.9 mm intraparenchymal hemorrhage in the right middle cerebellar peduncle. 2. No acute osseous injury of the cervical spine. 3. Cervical spine spondylosis as described above. Critical Value/emergent results were called by telephone at the time of interpretation on 09/08/2022 at 3:20 pm to provider ADAM CURATOLO , who verbally acknowledged these results. Electronically Signed   By: Elige Ko M.D.   On: 09/08/2022 15:21   CT CERVICAL SPINE WO CONTRAST  Result Date: 09/08/2022 CLINICAL DATA:  Head trauma, status post fall EXAM: CT HEAD WITHOUT CONTRAST CT CERVICAL SPINE WITHOUT CONTRAST TECHNIQUE: Multidetector CT imaging of the head and cervical spine was performed following the standard protocol without intravenous contrast. Multiplanar CT image reconstructions of the cervical spine were also generated. RADIATION DOSE REDUCTION: This exam was performed according to the departmental dose-optimization program which includes automated exposure control, adjustment of the mA and/or kV according to patient size and/or use of iterative reconstruction technique. COMPARISON:  08/16/2010 FINDINGS: CT HEAD FINDINGS Brain: No evidence of acute infarction, ventriculomegaly, or mass effect. Acute right subdural hemorrhage along the right cerebral convexity measuring 5.8 mm in maximum thickness. 7.8 x 6.1 x 10.9 mm intraparenchymal hemorrhage in the right middle cerebellar peduncle. No midline shift. Generalized cerebral atrophy. Periventricular white matter low attenuation likely secondary to microangiopathy. Vascular: Cerebrovascular atherosclerotic  calcifications are noted. No hyperdense vessels. Skull: Negative for fracture or focal lesion. Sinuses/Orbits: Visualized portions of the orbits are unremarkable. Visualized portions of the paranasal sinuses are unremarkable. Visualized portions of the mastoid air cells are unremarkable. Other: Right frontal scalp hematoma. CT CERVICAL SPINE FINDINGS Alignment: Normal. Skull base and vertebrae: No acute fracture. No primary bone lesion or focal pathologic process. Soft tissues and spinal canal: No prevertebral fluid or swelling. No visible canal hematoma. Disc levels: Anterior bridging osteophytes throughout the cervicothoracic spine. 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 Mild bilateral foraminal stenosis at C3-4 and C4-5. Bilateral foraminal stenosis at C6-7. Bilateral facet arthropathy throughout the cervical spine. Upper chest: Lung apices are clear. Thoracic aortic atherosclerosis. Other: No fluid collection or hematoma. Carotid artery atherosclerosis. IMPRESSION: 1. Acute right subdural hemorrhage along the right cerebral convexity measuring 5.8 mm in maximum thickness. A 7.8 x 6.1 x 10.9 mm intraparenchymal hemorrhage in the right middle cerebellar peduncle. 2. No acute osseous injury of the cervical spine. 3. Cervical spine spondylosis as described above. Critical Value/emergent results were called by telephone at the time of interpretation on 09/08/2022 at 3:20 pm to provider ADAM CURATOLO , who verbally acknowledged these results. Electronically Signed   By: Elige Ko M.D.   On: 09/08/2022 15:21   DG Knee Right Port  Result Date: 09/08/2022 CLINICAL DATA:  Status post fall. EXAM: PORTABLE RIGHT KNEE - 1-2 VIEW COMPARISON:  None Available. FINDINGS: The mineralization and alignment are normal. There is no evidence of acute fracture or dislocation. Minimal lateral and patellofemoral compartment degenerative changes with patellar  spurring. Possible small knee joint effusion. No acute soft tissue abnormalities are identified. Femoral atherosclerosis noted. IMPRESSION: No evidence of acute fracture or dislocation. Possible small knee joint effusion. Electronically Signed   By: Carey Bullocks M.D.   On: 09/08/2022 15:12   DG Wrist Complete Right  Result Date: 09/08/2022 CLINICAL DATA:  Status post fall. EXAM: RIGHT WRIST - COMPLETE 3+ VIEW COMPARISON:  None Available. FINDINGS: The mineralization and alignment are normal. There is no evidence of acute fracture or dislocation. Mild degenerative changes the radial aspect of the wrist with scattered vascular calcifications. No evidence of foreign body. IMPRESSION: No evidence of acute fracture or dislocation. Mild degenerative changes. Electronically Signed   By: Carey Bullocks M.D.   On: 09/08/2022 15:10   DG Chest Port 1 View  Result Date: 09/08/2022 CLINICAL DATA:  Fall EXAM: PORTABLE CHEST 1 VIEW COMPARISON:  01/18/2022 FINDINGS: The heart size and mediastinal contours are within normal limits. Aortic atherosclerosis. No focal airspace consolidation, pleural effusion, or pneumothorax. No acute bony findings. IMPRESSION: No active disease. Electronically Signed   By: Duanne Guess D.O.   On: 09/08/2022 15:10   DG Pelvis Portable  Result Date: 09/08/2022 CLINICAL DATA:  Trauma patient.  Patient fell. EXAM: PORTABLE PELVIS 1-2 VIEWS COMPARISON:  Abdominopelvic CT 01/18/2022. FINDINGS: Two supine views are submitted. There is no evidence of acute fracture or dislocation. Moderate degenerative changes are present in both hips. The sacroiliac joints and symphysis pubis are intact. Ankylosis of the lower lumbar spine and a right iliac vascular stent are noted. IMPRESSION: No evidence of acute pelvic fracture. Moderate degenerative changes in both hips. Electronically Signed   By: Carey Bullocks M.D.   On: 09/08/2022 15:09    Pending Labs Unresulted Labs (From admission, onward)      Start     Ordered   09/08/22 1445  Urinalysis, Routine w reflex microscopic -Urine, Clean Catch  (Trauma Panel)  Once,   URGENT       Question:  Specimen Source  Answer:  Urine, Clean Catch   09/08/22 1445            Vitals/Pain Today's Vitals   09/08/22 1600 09/08/22 1645 09/08/22 1700 09/08/22 1730  BP: (!) 149/77 (!) 153/74 (!) 168/79 (!) 169/88  Pulse: 70 72 90 85  Resp: 14 13 20 18   Temp:      TempSrc:  SpO2: 96% 97% 98% 100%  Weight:      Height:      PainSc:        Isolation Precautions No active isolations  Medications Medications   stroke: early stages of recovery book (has no administration in time range)  acetaminophen (TYLENOL) tablet 650 mg (has no administration in time range)    Or  acetaminophen (TYLENOL) 160 MG/5ML solution 650 mg (has no administration in time range)    Or  acetaminophen (TYLENOL) suppository 650 mg (has no administration in time range)  senna-docusate (Senokot-S) tablet 1 tablet (has no administration in time range)  pantoprazole (PROTONIX) injection 40 mg (has no administration in time range)  labetalol (NORMODYNE) injection 20 mg (20 mg Intravenous Given 09/08/22 1755)    And  clevidipine (CLEVIPREX) infusion 0.5 mg/mL (2 mg/hr Intravenous New Bag/Given 09/08/22 1746)  lidocaine (PF) (XYLOCAINE) 1 % injection 30 mL (30 mLs Intradermal Given 09/08/22 1612)  morphine (PF) 2 MG/ML injection 2 mg (2 mg Intravenous Given 09/08/22 1755)    Mobility walks     Focused Assessments Neuro Assessment Handoff:  Swallow screen pass?  Not attempted yet- pt being sutured Cardiac Rhythm: Normal sinus rhythm       Neuro Assessment:   Neuro Checks:      Has TPA been given? No If patient is a Neuro Trauma and patient is going to OR before floor call report to 4N Charge nurse: [phone] or [phone]   R Recommendations: See Admitting Provider Note  Report given to:   Additional Notes:

## 2022-09-08 NOTE — Consult Note (Addendum)
Neurosurgery Consultation  Reason for Consult: SDH Referring Physician: Amada Jupiter  CC: Fall  HPI: This is a 87 y.o. man that presents after a fall while walking to the mailbox. He denies any preceding symptoms - no veritgo / worsenign balance / issues with BUE/LUE function, no prior nausea or changes in facial sensation / function. PMHx / medications notable for clopidogrel.   ROS: A 14 point ROS was performed and is negative except as noted in the HPI.   PMHx:  Past Medical History:  Diagnosis Date   BPH (benign prostatic hyperplasia)    Colon polyps    Coronary artery disease    Dyslipidemia    ED (erectile dysfunction)    Hydrocele    Hyperlipidemia    Hypertension    MI (myocardial infarction) (HCC) 1992   Nocturnal enuresis    FamHx: History reviewed. No pertinent family history. SocHx:  reports that he quit smoking about 32 years ago. His smoking use included cigarettes. He has a 60.00 pack-year smoking history. He has never been exposed to tobacco smoke. He has quit using smokeless tobacco.  His smokeless tobacco use included snuff. He reports current alcohol use. He reports that he does not use drugs.  Exam: Vital signs in last 24 hours: Temp:  [97.6 F (36.4 C)-97.7 F (36.5 C)] 97.7 F (36.5 C) (05/17 1758) Pulse Rate:  [70-90] 88 (05/17 1807) Resp:  [13-20] 16 (05/17 1807) BP: (142-169)/(74-127) 142/81 (05/17 1807) SpO2:  [94 %-100 %] 94 % (05/17 1807) Weight:  [100.4 kg] 100.4 kg (05/17 1442) General: Awake, alert, cooperative, lying in bed in NAD Head: Normocephalic, +periorbital ecchymosis and repaired forehead lac HEENT: Neck supple Pulmonary: breathing room air comfortably, no evidence of increased work of breathing Cardiac: RRR Abdomen: S NT ND Extremities: Warm and well perfused x4 Neuro: AOx3, PERRL, EOMI, FS & SS Strength 5/5 x4, SILTx4, +L > R dysmetria with some intention tremor   Assessment and Plan: 87 y.o. man s/p fall. CTH personally  reviewed, which shows thin R supratentorial aSDH, but also a right cerebellar MCP hyperdensity with some surrounding hypodensity - presumed R MCP HTNive hemorrhage, may have precipitated the fall but history doesn't correlate well.   -hold the clopidogrel, monitor inpatient on stepdown, and repeat CTH tomorrow morning -diet / activity as tolerated from my standpoint -please call with any concerns or questions  Jadene Pierini, MD 09/08/22 6:27 PM [facility]

## 2022-09-08 NOTE — ED Provider Notes (Signed)
[facility]GENC[facility] DEPARTMENT AT [facility] Provider Note   CSN: [id number] Arrival date & time: 09/08/22  1432     History Chief Complaint  Patient presents with   Trauma   Fall    HPI Timothy Arias is a 87 y.o. male presenting for ground-level fall.  87 year old male with a extensive medical history.  States that he was walking the mailbox when he lost his balance.  He was found on the ground.  Large laceration to right forehead.  Patient's recorded medical, surgical, social, medication list and allergies were reviewed in the Snapshot window as part of the initial history.   Review of Systems   Review of Systems  Constitutional:  Negative for chills and fever.  HENT:  Negative for ear pain and sore throat.   Eyes:  Negative for pain and visual disturbance.  Respiratory:  Negative for cough and shortness of breath.   Cardiovascular:  Negative for chest pain and palpitations.  Gastrointestinal:  Negative for abdominal pain and vomiting.  Genitourinary:  Negative for dysuria and hematuria.  Musculoskeletal:  Negative for arthralgias and back pain.  Skin:  Negative for color change and rash.  Neurological:  Negative for seizures and syncope.  All other systems reviewed and are negative.   Physical Exam Updated Vital Signs BP 132/78   Pulse 79   Temp 98.3 F (36.8 C) (Oral)   Resp 10   Ht 6' (1.829 m)   Wt 92.7 kg   SpO2 95%   BMI 27.72 kg/m  Physical Exam Physical Exam  Neurologic: GCS 15, motor intact in all four extremities, sensory intact in all 4 extremities  Head: Pupils are 3mm, equally round and reactive to light, patient has complex obvious facial trauma, no hemotympanum  Neck: patient has no midline neck tenderness, no obvious injuries.  Thorax: Patient has stable clavicles, stable thorax with bilateral chest rise and breath sounds heard.  No penetrating thoracic injury.  CV/Pulm: RRR, no audible murmer/rubs/gallops, CTAB  Abdomen: Patient  has no abdominal distention, no penetrating abdominal injury.  Back: Patient has no midline spinal tenderness in the thoracic and lumbar spine, patient has no paraspinal tenderness bilaterally.  Pelvis: Patient has a stable pelvis to compression with palpable femoral pulses.  Extremities:Patient's upper extremities with no obvious injury or abnormality, radial pulses present. Patient's lower extremities with no obvious injury or abnormality, tibial pulses present.     ED Course/ Medical Decision Making/ A&P    Procedures .Critical Care  Performed by: Glyn Ade, MD Authorized by: Glyn Ade, MD   Critical care provider statement:    Critical care time (minutes):  90   Critical care was necessary to treat or prevent imminent or life-threatening deterioration of the following conditions:  Trauma and CNS failure or compromise   Critical care was time spent personally by me on the following activities:  Development of treatment plan with patient or surrogate, discussions with consultants, evaluation of patient's response to treatment, examination of patient, ordering and review of laboratory studies, ordering and review of radiographic studies, ordering and performing treatments and interventions, pulse oximetry, re-evaluation of patient's condition and review of old charts   Care discussed with: admitting provider   .Marland KitchenLaceration Repair  Date/Time: 09/08/2022 10:05 PM  Performed by: Glyn Ade, MD Authorized by: Glyn Ade, MD   Consent:    Consent obtained:  Verbal   Consent given by:  Patient   Risks, benefits, and alternatives were discussed: yes  Risks discussed:  Infection, pain, poor cosmetic result and need for additional repair Laceration details:    Length (cm):  2.6 Skin repair:    Repair method:  Sutures   Suture size:  3-0   Number of sutures:  2 Approximation:    Approximation:  Loose Repair type:    Repair type:   Intermediate Post-procedure details:    Procedure completion:  Tolerated Comments:     Stellate complex laceration. Approximated loosely with retaining stitches and will allow for healing by secondary intention.    Medications Ordered in ED Medications   stroke: early stages of recovery book (has no administration in time range)  acetaminophen (TYLENOL) tablet 650 mg (has no administration in time range)    Or  acetaminophen (TYLENOL) 160 MG/5ML solution 650 mg (has no administration in time range)    Or  acetaminophen (TYLENOL) suppository 650 mg (has no administration in time range)  senna-docusate (Senokot-S) tablet 1 tablet (1 tablet Oral Given 09/08/22 2111)  pantoprazole (PROTONIX) injection 40 mg (40 mg Intravenous Given 09/08/22 2111)  labetalol (NORMODYNE) injection 20 mg (20 mg Intravenous Given 09/08/22 1755)    And  clevidipine (CLEVIPREX) infusion 0.5 mg/mL (2 mg/hr Intravenous Infusion Verify 09/08/22 2100)  Chlorhexidine Gluconate Cloth 2 % PADS 6 each (6 each Topical Given 09/08/22 2000)  Oral care mouth rinse (has no administration in time range)  lidocaine (PF) (XYLOCAINE) 1 % injection 30 mL (30 mLs Intradermal Given 09/08/22 1612)  morphine (PF) 2 MG/ML injection 2 mg (2 mg Intravenous Given 09/08/22 1755)   Medical Decision Making:    Timothy Arias is a 87 y.o. male who presented to the ED today with a high mechanisma trauma, detailed above.    Additional history discussed with patient's family/caregivers.  Patient placed on continuous vitals and telemetry monitoring while in ED which was reviewed periodically.   Given this mechanism of trauma, a full physical exam was performed.   Reviewed and confirmed nursing documentation for past medical history, family history, social history.    Initial Assessment/Plan:   This is a patient presenting with a high mechanism trauma.  As such, I have considered intracranial injuries including intracranial hemorrhage,  intrathoracic injuries including blunt myocardial or blunt lung injury, blunt abdominal injuries including aortic dissection, bladder injury, spleen injury, liver injury and I have considered orthopedic injuries including extremity or spinal injury.  With the patient's presentation of high mechanism trauma and abnormalities detailed above, patient warrants aggressive evaluation for potential traumatic injuries. Does not currently meet criteria for level trauma activation per departmental policy, though continuous reassessments will be initiated per protocol. Will proceed with non-level trauma protocol to evaluate for potential injuries. Will proceed with CT Head, Cervical/Thoracic/Lumbar Spine, and Chest/Abdomen/Pelvis with contrast. Scans resulted with acute intracranial hemorrhage. Discussed case with neurosurgery, neurology as a consult to assist with management of these injuries.    Final Reassessment and Plan:   Patient required titration onto a Cleviprex drip.  Repair of his laceration and careful monitoring for interval worsening.   Disposition:   Based on the above findings, I believe this patient is stable for admission.    Patient/family educated about specific findings on our evaluation and explained exact reasons for admission.  Patient/family educated about clinical situation and time was allowed to answer questions.   Admission team communicated with and agreed with need for admission. Patient admitted. Patient ready to move at this time.     Emergency Department Medication Summary:  Medications   stroke: early stages of recovery book (has no administration in time range)  acetaminophen (TYLENOL) tablet 650 mg (has no administration in time range)    Or  acetaminophen (TYLENOL) 160 MG/5ML solution 650 mg (has no administration in time range)    Or  acetaminophen (TYLENOL) suppository 650 mg (has no administration in time range)  senna-docusate (Senokot-S) tablet 1 tablet (1  tablet Oral Given 09/08/22 2111)  pantoprazole (PROTONIX) injection 40 mg (40 mg Intravenous Given 09/08/22 2111)  labetalol (NORMODYNE) injection 20 mg (20 mg Intravenous Given 09/08/22 1755)    And  clevidipine (CLEVIPREX) infusion 0.5 mg/mL (2 mg/hr Intravenous Infusion Verify 09/08/22 2100)  Chlorhexidine Gluconate Cloth 2 % PADS 6 each (6 each Topical Given 09/08/22 2000)  Oral care mouth rinse (has no administration in time range)  lidocaine (PF) (XYLOCAINE) 1 % injection 30 mL (30 mLs Intradermal Given 09/08/22 1612)  morphine (PF) 2 MG/ML injection 2 mg (2 mg Intravenous Given 09/08/22 1755)           Clinical Impression:  1. Traumatic intracerebral hemorrhage with unknown loss of consciousness status, unspecified laterality, initial encounter (HCC)   2. Nontraumatic intracerebral hemorrhage of cerebellum, unspecified laterality (HCC)      Admit   Final Clinical Impression(s) / ED Diagnoses Final diagnoses:  Traumatic intracerebral hemorrhage with unknown loss of consciousness status, unspecified laterality, initial encounter (HCC)  Nontraumatic intracerebral hemorrhage of cerebellum, unspecified laterality [facility])    Rx / DC Orders ED Discharge Orders     None         Glyn Ade, MD 09/08/22 2208

## 2022-09-08 NOTE — Progress Notes (Signed)
Orthopedic Tech Progress Note Patient Details:  Timothy Arias 1935/07/28 [id number]  Level 2 trauma, ortho techs present upon pt arrival.  Patient ID: [name], male   DOB: [birth year], 87 y.o.   MRN: [id number]  Docia Furl 09/08/2022, 3:03 PM

## 2022-09-08 NOTE — ED Notes (Signed)
Patient transported to CT 

## 2022-09-09 ENCOUNTER — Inpatient Hospital Stay (HOSPITAL_COMMUNITY): Payer: HMO

## 2022-09-09 ENCOUNTER — Other Ambulatory Visit (HOSPITAL_COMMUNITY): Payer: HMO

## 2022-09-09 DIAGNOSIS — I614 Nontraumatic intracerebral hemorrhage in cerebellum: Secondary | ICD-10-CM | POA: Diagnosis not present

## 2022-09-09 MED ORDER — LABETALOL HCL 5 MG/ML IV SOLN
10.0000 mg | INTRAVENOUS | Status: DC | PRN
  Administered 2022-09-09: 10 mg via INTRAVENOUS
  Administered 2022-09-09 – 2022-09-10 (×3): 20 mg via INTRAVENOUS
  Filled 2022-09-09 (×4): qty 4

## 2022-09-09 MED ORDER — AMLODIPINE BESYLATE 5 MG PO TABS
5.0000 mg | ORAL_TABLET | Freq: Every day | ORAL | Status: DC
  Administered 2022-09-09 – 2022-09-13 (×5): 5 mg via ORAL
  Filled 2022-09-09 (×5): qty 1

## 2022-09-09 MED ORDER — CITALOPRAM HYDROBROMIDE 10 MG PO TABS
20.0000 mg | ORAL_TABLET | Freq: Every day | ORAL | Status: DC
  Administered 2022-09-09 – 2022-09-13 (×5): 20 mg via ORAL
  Filled 2022-09-09 (×6): qty 2

## 2022-09-09 MED ORDER — IOHEXOL 350 MG/ML SOLN
75.0000 mL | Freq: Once | INTRAVENOUS | Status: AC | PRN
  Administered 2022-09-09: 75 mL via INTRAVENOUS

## 2022-09-09 MED ORDER — GALANTAMINE HYDROBROMIDE ER 8 MG PO CP24
16.0000 mg | ORAL_CAPSULE | Freq: Every day | ORAL | Status: DC
  Administered 2022-09-09 – 2022-09-12 (×4): 16 mg via ORAL
  Filled 2022-09-09 (×6): qty 2

## 2022-09-09 MED ORDER — HYDRALAZINE HCL 20 MG/ML IJ SOLN
10.0000 mg | Freq: Four times a day (QID) | INTRAMUSCULAR | Status: DC | PRN
  Administered 2022-09-09: 20 mg via INTRAVENOUS
  Filled 2022-09-09: qty 1

## 2022-09-09 MED ORDER — MEMANTINE HCL 10 MG PO TABS
10.0000 mg | ORAL_TABLET | Freq: Two times a day (BID) | ORAL | Status: DC
  Administered 2022-09-09 – 2022-09-13 (×9): 10 mg via ORAL
  Filled 2022-09-09 (×10): qty 1

## 2022-09-09 NOTE — Progress Notes (Signed)
PIV infiltrated during CT w/ contrast.  Infiltration marked, radiologist notified and observed at bedside.   IV team consult ordered for additional IV placement.

## 2022-09-09 NOTE — Progress Notes (Addendum)
STROKE TEAM PROGRESS NOTE   INTERVAL HISTORY No family at the bedside.  RN at the bedside. Patient is awake alert sitting up eating breakfast. Labs are stable.  Vitals are stable  Vitals:   09/09/22 1100 09/09/22 1108 09/09/22 1200 09/09/22 1310  BP: 106/89  (!) 145/80 (!) 151/75  Pulse: 78  72 74  Resp: 19  15 18   Temp:  97.9 F (36.6 C)    TempSrc:  Oral    SpO2: 92%  97% 94%  Weight:      Height:       CBC:  Recent Labs  Lab 09/08/22 1439 09/08/22 1509  WBC 6.9  --   HGB 12.7* 13.3  HCT 38.8* 39.0  MCV 91.3  --   PLT 154  --    Basic Metabolic Panel:  Recent Labs  Lab 09/08/22 1439 09/08/22 1509  NA 140 140  K 3.7 3.8  CL 105 104  CO2 22  --   GLUCOSE 157* 155*  BUN 10 13  CREATININE 1.01 0.90  CALCIUM 9.6  --    Lipid Panel: No results for input(s): "CHOL", "TRIG", "HDL", "CHOLHDL", "VLDL", "LDLCALC" in the last 168 hours. HgbA1c: No results for input(s): "HGBA1C" in the last 168 hours. Urine Drug Screen: No results for input(s): "LABOPIA", "COCAINSCRNUR", "LABBENZ", "AMPHETMU", "THCU", "LABBARB" in the last 168 hours.  Alcohol Level  Recent Labs  Lab 09/08/22 1439  ETH <10    IMAGING past 24 hours CT HEAD WO CONTRAST ( )  Result Date: 09/09/2022 CLINICAL DATA:  87 year old male status post fall with right side subdural hematoma. EXAM: CT HEAD WITHOUT CONTRAST TECHNIQUE: Contiguous axial images were obtained from the base of the skull through the vertex without intravenous contrast. RADIATION DOSE REDUCTION: This exam was performed according to the departmental dose-optimization program which includes automated exposure control, adjustment of the mA and/or kV according to patient size and/or use of iterative reconstruction technique. COMPARISON:  Head CT yesterday. Head CT 08/16/2010. FINDINGS: Brain: Right side subdural hematoma has read distributed more posteriorly now. Thickness is up to 4 mm, which is decreased from 6 mm yesterday. No midline shift.  Normal basilar cisterns. No significant ventricle mass effect. No ventriculomegaly. No IVH. No subarachnoid blood. However, there is evidence of a small intra-axial hemorrhage in the right deep cerebellar nuclei and marginally involving the peduncle as seen yesterday. This is up to 12 mm long axis and stable to slightly smaller. No significant regional edema or mass effect. No other intra-axial blood. Stable gray-white matter differentiation throughout the brain. No cortically based acute infarct identified. Vascular: Calcified atherosclerosis at the skull base. No suspicious intracranial vascular hyperdensity. Skull: No skull fracture identified. Sinuses/Orbits: Bilateral paranasal sinus mucosal thickening is mild-to-moderate and stable. Tympanic cavities and mastoids remain clear. Other: Broad-based right lateral face and scalp soft tissue hematoma, possibly with suspicion of punctate retained foreign bodies just lateral to the right orbit. See series 4 images 39 and 40. Postoperative changes to both globes. IMPRESSION: 1. Right side subdural hematoma has mildly regressed and redistributed more posteriorly. Now up to 4 mm in thickness. 2. Small focus of intra-axial hemorrhage at the right deep cerebellum, peduncle also appears slightly smaller. No significant edema or mass effect. Presumably this is also posttraumatic (possibly shear hemorrhage). 3. No significant intracranial mass effect. No new intracranial abnormality. 4. No skull fracture identified but right lateral face and scalp hematoma, with suspicion of punctate retained foreign bodies just lateral to the right orbit. Electronically  Signed   By: Odessa Fleming M.D.   On: 09/09/2022 06:18   CT HEAD WO CONTRAST  Result Date: 09/08/2022 CLINICAL DATA:  Head trauma, status post fall EXAM: CT HEAD WITHOUT CONTRAST CT CERVICAL SPINE WITHOUT CONTRAST TECHNIQUE: Multidetector CT imaging of the head and cervical spine was performed following the standard protocol  without intravenous contrast. Multiplanar CT image reconstructions of the cervical spine were also generated. RADIATION DOSE REDUCTION: This exam was performed according to the departmental dose-optimization program which includes automated exposure control, adjustment of the mA and/or kV according to patient size and/or use of iterative reconstruction technique. COMPARISON:  08/16/2010 FINDINGS: CT HEAD FINDINGS Brain: No evidence of acute infarction, ventriculomegaly, or mass effect. Acute right subdural hemorrhage along the right cerebral convexity measuring 5.8 mm in maximum thickness. 7.8 x 6.1 x 10.9 mm intraparenchymal hemorrhage in the right middle cerebellar peduncle. No midline shift. Generalized cerebral atrophy. Periventricular white matter low attenuation likely secondary to microangiopathy. Vascular: Cerebrovascular atherosclerotic calcifications are noted. No hyperdense vessels. Skull: Negative for fracture or focal lesion. Sinuses/Orbits: Visualized portions of the orbits are unremarkable. Visualized portions of the paranasal sinuses are unremarkable. Visualized portions of the mastoid air cells are unremarkable. Other: Right frontal scalp hematoma. CT CERVICAL SPINE FINDINGS Alignment: Normal. Skull base and vertebrae: No acute fracture. No primary bone lesion or focal pathologic process. Soft tissues and spinal canal: No prevertebral fluid or swelling. No visible canal hematoma. Disc levels: Anterior bridging osteophytes throughout the cervicothoracic spine. 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 Mild bilateral foraminal stenosis at C3-4 and C4-5. Bilateral foraminal stenosis at C6-7. Bilateral facet arthropathy throughout the cervical spine. Upper chest: Lung apices are clear. Thoracic aortic atherosclerosis. Other: No fluid collection or hematoma. Carotid artery atherosclerosis. IMPRESSION: 1. Acute right subdural hemorrhage  along the right cerebral convexity measuring 5.8 mm in maximum thickness. A 7.8 x 6.1 x 10.9 mm intraparenchymal hemorrhage in the right middle cerebellar peduncle. 2. No acute osseous injury of the cervical spine. 3. Cervical spine spondylosis as described above. Critical Value/emergent results were called by telephone at the time of interpretation on 09/08/2022 at 3:20 pm to provider ADAM CURATOLO , who verbally acknowledged these results. Electronically Signed   By: Elige Ko M.D.   On: 09/08/2022 15:21   CT CERVICAL SPINE WO CONTRAST  Result Date: 09/08/2022 CLINICAL DATA:  Head trauma, status post fall EXAM: CT HEAD WITHOUT CONTRAST CT CERVICAL SPINE WITHOUT CONTRAST TECHNIQUE: Multidetector CT imaging of the head and cervical spine was performed following the standard protocol without intravenous contrast. Multiplanar CT image reconstructions of the cervical spine were also generated. RADIATION DOSE REDUCTION: This exam was performed according to the departmental dose-optimization program which includes automated exposure control, adjustment of the mA and/or kV according to patient size and/or use of iterative reconstruction technique. COMPARISON:  08/16/2010 FINDINGS: CT HEAD FINDINGS Brain: No evidence of acute infarction, ventriculomegaly, or mass effect. Acute right subdural hemorrhage along the right cerebral convexity measuring 5.8 mm in maximum thickness. 7.8 x 6.1 x 10.9 mm intraparenchymal hemorrhage in the right middle cerebellar peduncle. No midline shift. Generalized cerebral atrophy. Periventricular white matter low attenuation likely secondary to microangiopathy. Vascular: Cerebrovascular atherosclerotic calcifications are noted. No hyperdense vessels. Skull: Negative for fracture or focal lesion. Sinuses/Orbits: Visualized portions of the orbits are unremarkable. Visualized portions of the paranasal sinuses are unremarkable. Visualized portions of the mastoid air cells are unremarkable.  Other: Right frontal  scalp hematoma. CT CERVICAL SPINE FINDINGS Alignment: Normal. Skull base and vertebrae: No acute fracture. No primary bone lesion or focal pathologic process. Soft tissues and spinal canal: No prevertebral fluid or swelling. No visible canal hematoma. Disc levels: Anterior bridging osteophytes throughout the cervicothoracic spine. 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 Mild bilateral foraminal stenosis at C3-4 and C4-5. Bilateral foraminal stenosis at C6-7. Bilateral facet arthropathy throughout the cervical spine. Upper chest: Lung apices are clear. Thoracic aortic atherosclerosis. Other: No fluid collection or hematoma. Carotid artery atherosclerosis. IMPRESSION: 1. Acute right subdural hemorrhage along the right cerebral convexity measuring 5.8 mm in maximum thickness. A 7.8 x 6.1 x 10.9 mm intraparenchymal hemorrhage in the right middle cerebellar peduncle. 2. No acute osseous injury of the cervical spine. 3. Cervical spine spondylosis as described above. Critical Value/emergent results were called by telephone at the time of interpretation on 09/08/2022 at 3:20 pm to provider ADAM CURATOLO , who verbally acknowledged these results. Electronically Signed   By: Elige Ko M.D.   On: 09/08/2022 15:21   DG Knee Right Port  Result Date: 09/08/2022 CLINICAL DATA:  Status post fall. EXAM: PORTABLE RIGHT KNEE - 1-2 VIEW COMPARISON:  None Available. FINDINGS: The mineralization and alignment are normal. There is no evidence of acute fracture or dislocation. Minimal lateral and patellofemoral compartment degenerative changes with patellar spurring. Possible small knee joint effusion. No acute soft tissue abnormalities are identified. Femoral atherosclerosis noted. IMPRESSION: No evidence of acute fracture or dislocation. Possible small knee joint effusion. Electronically Signed   By: Carey Bullocks M.D.   On: 09/08/2022  15:12   DG Wrist Complete Right  Result Date: 09/08/2022 CLINICAL DATA:  Status post fall. EXAM: RIGHT WRIST - COMPLETE 3+ VIEW COMPARISON:  None Available. FINDINGS: The mineralization and alignment are normal. There is no evidence of acute fracture or dislocation. Mild degenerative changes the radial aspect of the wrist with scattered vascular calcifications. No evidence of foreign body. IMPRESSION: No evidence of acute fracture or dislocation. Mild degenerative changes. Electronically Signed   By: Carey Bullocks M.D.   On: 09/08/2022 15:10   DG Chest Port 1 View  Result Date: 09/08/2022 CLINICAL DATA:  Fall EXAM: PORTABLE CHEST 1 VIEW COMPARISON:  01/18/2022 FINDINGS: The heart size and mediastinal contours are within normal limits. Aortic atherosclerosis. No focal airspace consolidation, pleural effusion, or pneumothorax. No acute bony findings. IMPRESSION: No active disease. Electronically Signed   By: Duanne Guess D.O.   On: 09/08/2022 15:10   DG Pelvis Portable  Result Date: 09/08/2022 CLINICAL DATA:  Trauma patient.  Patient fell. EXAM: PORTABLE PELVIS 1-2 VIEWS COMPARISON:  Abdominopelvic CT 01/18/2022. FINDINGS: Two supine views are submitted. There is no evidence of acute fracture or dislocation. Moderate degenerative changes are present in both hips. The sacroiliac joints and symphysis pubis are intact. Ankylosis of the lower lumbar spine and a right iliac vascular stent are noted. IMPRESSION: No evidence of acute pelvic fracture. Moderate degenerative changes in both hips. Electronically Signed   By: Carey Bullocks M.D.   On: 09/08/2022 15:09    PHYSICAL EXAM  Temp:  [97.6 F (36.4 C)-98.3 F (36.8 C)] 97.9 F (36.6 C) (05/18 1108) Pulse Rate:  [70-100] 74 (05/18 1310) Resp:  [0-26] 18 (05/18 1310) BP: (106-169)/(50-127) 151/75 (05/18 1310) SpO2:  [92 %-100 %] 94 % (05/18 1310) Weight:  [92.7 kg-100.4 kg] 92.7 kg (05/17 1900)  General - Well nourished, well developed,  in  no apparent distress. Cardiovascular - Regular rhythm and rate.  Mental Status -  Level of arousal and orientation to time, place, and person were intact. Language including expression, naming, repetition, comprehension was assessed and found intact. Attention span and concentration were normal. Recent and remote memory were intact. Fund of Knowledge was assessed and was intact.  Cranial Nerves II - XII - II - Visual field intact OU. III, IV, VI - Extraocular movements intact. V - Facial sensation intact bilaterally. VII - Facial movement intact bilaterally. VIII - Hearing & vestibular intact bilaterally. X - Palate elevates symmetrically. XI - Chin turning & shoulder shrug intact bilaterally. XII - Tongue protrusion intact.  Motor Strength - The patient's strength was normal in all extremities and pronator drift was absent.  Bulk was normal and fasciculations were absent.   Motor Tone - Muscle tone was assessed at the neck and appendages and was normal.  Sensory - Light touch, temperature/pinprick were assessed and were symmetrical.    Coordination - The patient had normal movements in the hands and feet with no ataxia or dysmetria.  Tremor was absent.  Gait and Station - deferred.  ASSESSMENT/PLAN Mr. Timothy Arias is a 87 y.o. male with history of ischemic heart disease, HTN, acute lower limb ischemia s/p stenting who initially presented as a level 2 trauma. He was walking to the mailbox when he fell. Housekeeper saw him on the ground outside and yelled for Timothy Arias to call EMS. When she got outside he was laying on the ground with blood coming from Timothy head. He remembers the entire event, states Timothy legs came out from underneath him. Does not remember tripping over anything, but does not know exactly why he fell. Timothy Arias tells me that she has noticed him shuffling when he walks recently.   Stroke: Acute right traumatic cerebellar hemorrhage, acute right subdural  hematoma Etiology: Likely due to trauma CT head right cerebellar ICH, acute right subdural hematoma Repeat CT head stable CTA head & neck ordered MRI ordered Neurosurgery consulted no interventions at this time 2D Echo ordered LDL 143 HgbA1c ordered VTE prophylaxis -SCDs    Diet   Diet heart healthy/carb modified Room service appropriate? Yes; Fluid consistency: Thin   Aspirin 325 mg and Plavix 75 mg prior to admission, now on no antithrombotics.  Will consider restarting in about 7 days after repeat CT head is stable Therapy recommendations: Pending Disposition: Pending  Hypertension CAD Home meds: Norvasc 5 mg, metoprolol 50 mg Stable BP goal 1 30-1 50 As needed labetalol and hydralazine to maintain blood pressure Will restart home amlodipine Long-term BP goal normotensive  Hyperlipidemia Home meds: Atorvastatin 80 mg, not resumed in hospital LDL 143, goal < 70 Consider switching to Crestor Consider restarting on discharge.  Dementia Restart home Namenda and galantamine  Other Stroke Risk Factors Advanced Age >/= 65  Former cigarette smoker Coronary artery disease  Other Active Problems BPH Anemia-Hgb 12.7 will monitor  Hospital day # 1  Gevena Mart DNP, ACNPC-AG  [facility]  ATTENDING ATTESTATION:  Right subdural hemorrhage and right cerebellar ICH that appears to be stable.  Repeat head CT is stable.  MRI is pending.  Appreciate neurosurgery assistance.  Will likely transfer the ICU tomorrow if exam is stable.  Start DVT prophylaxis tomorrow.  Hold on Plavix for now  Dr. Viviann Spare evaluated Timothy Arias independently, reviewed imaging, chart, labs. Discussed and formulated plan with the Resident/APP. Changes were made to the note where appropriate.  Please see APP/resident note above for details.     This patient is critically ill due to subdural hemorrhage with intraparenchymal hemorrhage and at significant risk of neurological worsening, death form heart  failure, respiratory failure, recurrent stroke, bleeding from [facility], seizure, sepsis. This patient's care requires constant monitoring of vital signs, hemodynamics, respiratory and cardiac monitoring, review of multiple databases, neurological assessment, discussion with family, other specialists and medical decision making of high complexity. I spent 35 minutes of neurocritical care time in the care of this patient.   Jacquiline Zurcher,MD    To contact Stroke Continuity provider, please refer to [email or web address]. After hours, contact General Neurology

## 2022-09-09 NOTE — Evaluation (Signed)
Occupational Therapy Evaluation Patient Details Name: Timothy Arias MRN: [id number] DOB: February 22, 1936 Today's Date: 09/09/2022   History of Present Illness 87 y.o. male presents to [facility] hospital on 09/08/2022 after a fall, found to have R SDH along with R cerebellar IPH. PMH: BPH, CAD, ED, HLD< HTN, M   Clinical Impression   Patient admitted for the diagnosis above.  PTA he lives at home with his spouse, who can provide supportive services.  Patient continued to perform his own ADL, and generally did not walk with his cane.  Spouse assists with iADL and medications.  Currently he is very unsteady, and demonstrates poor insight into his deficits.  He is needing generalized Min A for mobility and ADL completion from a sit to stand level.  OT is indicated in the acute setting to address deficits, and for now, no post acute OT is anticipated given spouse's ability to assist.       Recommendations for follow up therapy are one component of a multi-disciplinary discharge planning process, led by the attending physician.  Recommendations may be updated based on patient status, additional functional criteria and insurance authorization.   Assistance Recommended at Discharge Intermittent Supervision/Assistance  Patient can return home with the following Assist for transportation;Assistance with cooking/housework;A little help with bathing/dressing/bathroom;A little help with walking and/or transfers;Direct supervision/assist for financial management;Direct supervision/assist for medications management    Functional Status Assessment  Patient has had a recent decline in their functional status and demonstrates the ability to make significant improvements in function in a reasonable and predictable amount of time.  Equipment Recommendations  Tub/shower seat    Recommendations for Other Services       Precautions / Restrictions Precautions Precautions: Fall Restrictions Weight Bearing Restrictions: No       Mobility Bed Mobility Overal bed mobility: Needs Assistance Bed Mobility: Supine to Sit, Sit to Supine     Supine to sit: Supervision Sit to supine: Supervision        Transfers Overall transfer level: Needs assistance   Transfers: Sit to/from Stand, Bed to chair/wheelchair/BSC Sit to Stand: Supervision     Step pivot transfers: Min assist            Balance Overall balance assessment: Needs assistance Sitting-balance support: Feet supported Sitting balance-Leahy Scale: Good     Standing balance support: Single extremity supported Standing balance-Leahy Scale: Poor                             ADL either performed or assessed with clinical judgement   ADL Overall ADL's : Needs assistance/impaired Eating/Feeding: Independent;Sitting   Grooming: Wash/dry hands;Min guard;Standing   Upper Body Bathing: Supervision/ safety;Sitting   Lower Body Bathing: Minimal assistance;Sit to/from stand   Upper Body Dressing : Set up;Sitting   Lower Body Dressing: Minimal assistance;Sit to/from stand   Toilet Transfer: Minimal assistance;Ambulation;Regular Toilet                   Vision Patient Visual Report: No change from baseline       Perception     Praxis      Pertinent Vitals/Pain Pain Assessment Pain Assessment: Faces Faces Pain Scale: Hurts little more Pain Location: headache Pain Descriptors / Indicators: Headache Pain Intervention(s): RN gave pain meds during session     Hand Dominance Right   Extremity/Trunk Assessment Upper Extremity Assessment Upper Extremity Assessment: Overall WFL for tasks assessed   Lower Extremity Assessment Lower  Extremity Assessment: Defer to PT evaluation   Cervical / Trunk Assessment Cervical / Trunk Assessment: Kyphotic   Communication Communication Communication: No difficulties   Cognition Arousal/Alertness: Awake/alert Behavior During Therapy: WFL for tasks  assessed/performed Overall Cognitive Status: History of cognitive impairments - at baseline                                 General Comments: HOH, defers to spouse for a lot of questions.     General Comments   Mild dizziness noted with positional changes.      Exercises     Shoulder Instructions      Home Living Family/patient expects to be discharged to:: Private residence Living Arrangements: Spouse/significant other Available Help at Discharge: Family;Available 24 hours/day Type of Home: House Home Access: Stairs to enter [employer] of Steps: 5 Entrance Stairs-Rails: Right;Left Home Layout: One level     Bathroom Shower/Tub: Producer, television/film/video: Standard Bathroom Accessibility: Yes   Home Equipment: Agricultural consultant (2 wheels);Cane - single point;Wheelchair - manual;Grab bars - tub/shower          Prior Functioning/Environment Prior Level of Function : Needs assist             Mobility Comments: uses cane for household ambulation ocassionally, drives locally ADLs Comments: independent in ADLs, wife does the majority of iADLs including meds management        OT Problem List: Decreased activity tolerance;Impaired balance (sitting and/or standing);Decreased safety awareness      OT Treatment/Interventions: Self-care/ADL training;Therapeutic activities;Balance training;DME and/or AE instruction    OT Goals(Current goals can be found in the care plan section) Acute Rehab OT Goals Patient Stated Goal: Return home OT Goal Formulation: With patient Time For Goal Achievement: 09/22/22 Potential to Achieve Goals: Good  OT Frequency: Min 2X/week    Co-evaluation              AM-PAC OT "6 Clicks" Daily Activity     Outcome Measure Help from another person eating meals?: None Help from another person taking care of personal grooming?: A Little Help from another person toileting, which includes using toliet, bedpan, or  urinal?: A Little Help from another person bathing (including washing, rinsing, drying)?: A Little Help from another person to put on and taking off regular upper body clothing?: None Help from another person to put on and taking off regular lower body clothing?: A Little 6 Click Score: 20   End of Session Equipment Utilized During Treatment: Gait belt Nurse Communication: Mobility status  Activity Tolerance: Patient tolerated treatment well Patient left: in bed;with call bell/phone within reach;with bed alarm set;with family/visitor present  OT Visit Diagnosis: Unsteadiness on feet (R26.81)                Time: 1610-9604 OT Time Calculation (min): 27 min Charges:  OT General Charges $OT Visit: 1 Visit OT Evaluation $OT Eval Moderate Complexity: 1 Mod OT Treatments $Self Care/Home Management : 8-22 mins  09/09/2022  RP, OTR/L  Acute Rehabilitation Services  Office:  [phone]   Suzanna Obey 09/09/2022, 1:57 PM

## 2022-09-09 NOTE — Progress Notes (Signed)
Came bedside for echo, but patient needs to go to CT at this time per nurse.

## 2022-09-09 NOTE — Evaluation (Signed)
Physical Therapy Evaluation Patient Details Name: Timothy Arias MRN: [id number] DOB: 1935-11-07 Today's Date: 09/09/2022  History of Present Illness  87 y.o. 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 PMH: BPH, CAD, ED, HLD< HTN, M  Clinical Impression  Pt presents to PT with deficits in gait, balance, endurance. Pt and spouse report the pt has been ambulating less frequently in recent months due to BLE pain. Pt's spouse reports she has noted shuffling gait since discharge from hospital in March after LE stent placement. Pt continues to demonstrating shuffling gait at this time. Pt demonstrates improvement in stability with UE support of RW, as he tends to lean anteriorly without UE support. PT recommends discharge home with HHPT, no current DME needs.       Recommendations for follow up therapy are one component of a multi-disciplinary discharge planning process, led by the attending physician.  Recommendations may be updated based on patient status, additional functional criteria and insurance authorization.  Follow Up Recommendations       Assistance Recommended at Discharge PRN  Patient can return home with the following  A little help with bathing/dressing/bathroom;Assistance with cooking/housework;Assist for transportation;Help with stairs or ramp for entrance    Equipment Recommendations None recommended by PT (owns necessary DME)  Recommendations for Other Services       Functional Status Assessment Patient has had a recent decline in their functional status and demonstrates the ability to make significant improvements in function in a reasonable and predictable amount of time.     Precautions / Restrictions Precautions Precautions: Fall Restrictions Weight Bearing Restrictions: No      Mobility  Bed Mobility Overal bed mobility: Needs Assistance Bed Mobility: Supine to Sit     Supine to sit: Supervision,  HOB elevated          Transfers Overall transfer level: Needs assistance Equipment used: None Transfers: Sit to/from Stand, Bed to chair/wheelchair/BSC Sit to Stand: Supervision   Step pivot transfers: Supervision            Ambulation/Gait Ambulation/Gait assistance: Min guard, Supervision Gait Distance (Feet): 250 Feet (initial trial of 100' without DME, 2nd trial with RW for 250') Assistive device: Rolling walker (2 wheels), None Gait Pattern/deviations: Shuffle Gait velocity: reduced Gait velocity interpretation: 1.31 - 2.62 ft/sec, indicative of limited community ambulator   General Gait Details: slowed shuffling gait, PT notes reduced foot clearance bilaterally both without DME and with RW  Stairs            Wheelchair Mobility    Modified Rankin (Stroke Patients Only) Modified Rankin (Stroke Patients Only) Pre-Morbid Rankin Score: Moderate disability Modified Rankin: Moderately severe disability     Balance Overall balance assessment: Needs assistance Sitting-balance support: No upper extremity supported, Feet supported Sitting balance-Leahy Scale: Good     Standing balance support: Single extremity supported, Reliant on assistive device for balance Standing balance-Leahy Scale: Poor                               Pertinent Vitals/Pain Pain Assessment Pain Assessment: Faces Faces Pain Scale: Hurts little more Pain Location: headache Pain Descriptors / Indicators: Headache Pain Intervention(s): Monitored during session    Home Living Family/patient expects to be discharged to:: Private residence Living Arrangements: Spouse/significant other Available Help at Discharge: Family;Available 24 hours/day Type of Home: House Home Access: Stairs to enter  Entrance Stairs-Rails: Right;Left Entrance Stairs-Number of Steps: 5   Home Layout: One level Home Equipment: Agricultural consultant (2 wheels);Cane - single point;Grab bars -  tub/shower;Rollator (4 wheels);Transport chair      Prior Function Prior Level of Function : Needs assist             Mobility Comments: uses cane for household ambulation ocassionally, drives locally ADLs Comments: independent in ADLs, wife does the majority of iADLs including meds management     Hand Dominance   Dominant Hand: Right    Extremity/Trunk Assessment   Upper Extremity Assessment Upper Extremity Assessment: Overall WFL for tasks assessed    Lower Extremity Assessment Lower Extremity Assessment: Generalized weakness (pt reports he has not been ambulating as much in recent months due to LE pain. Spouse reports she notes a shuffling gait pattern since stent placement in March)    Cervical / Trunk Assessment Cervical / Trunk Assessment: Kyphotic  Communication   Communication: HOH  Cognition Arousal/Alertness: Awake/alert Behavior During Therapy: WFL for tasks assessed/performed Overall Cognitive Status: Impaired/Different from baseline Area of Impairment: Safety/judgement, Awareness                         Safety/Judgement: Decreased awareness of deficits Awareness: Emergent            General Comments General comments (skin integrity, edema, etc.): VSS on RA    Exercises     Assessment/Plan    PT Assessment Patient needs continued PT services  PT Problem List Decreased strength;Decreased activity tolerance;Decreased balance;Decreased mobility;Decreased knowledge of use of DME;Decreased safety awareness;Decreased knowledge of precautions       PT Treatment Interventions DME instruction;Gait training;Stair training;Functional mobility training;Therapeutic activities;Therapeutic exercise;Balance training;Neuromuscular re-education;Patient/family education    PT Goals (Current goals can be found in the Care Plan section)  Acute Rehab PT Goals Patient Stated Goal: to return to ambulating with [facility] PT Goal Formulation: With  patient/family Time For Goal Achievement: 09/23/22 Potential to Achieve Goals: Good Additional Goals Additional Goal #1: Pt will score >19/24 on the DGI to indicate a reduced risk for falls    Frequency Min 4X/week     Co-evaluation               AM-PAC PT "6 Clicks" Mobility  Outcome Measure Help needed turning from your back to your side while in a flat bed without using bedrails?: A Little Help needed moving from lying on your back to sitting on the side of a flat bed without using bedrails?: A Little Help needed moving to and from a bed to a chair (including a wheelchair)?: A Little Help needed standing up from a chair using your arms (e.g., wheelchair or bedside chair)?: A Little Help needed to walk in hospital room?: A Little Help needed climbing 3-5 steps with a railing? : A Lot 6 Click Score: 17    End of Session Equipment Utilized During Treatment: Gait belt Activity Tolerance: Patient tolerated treatment well Patient left: in chair;with call bell/phone within reach;with chair alarm set;with family/visitor present Nurse Communication: Mobility status PT Visit Diagnosis: Other abnormalities of gait and mobility (R26.89);Muscle weakness (generalized) (M62.81)    Time: 4098-1191 PT Time Calculation (min) (ACUTE ONLY): 30 min   Charges:   PT Evaluation $PT Eval Low Complexity: 1 Low          Arlyss Gandy, PT, DPT Acute Rehabilitation Office [phone]   Arlyss Gandy 09/09/2022, 2:46 PM

## 2022-09-09 NOTE — Progress Notes (Signed)
Iodinated Contrast Extravasation Treatment Guidelines  Quick Reference For Radiologists and Technologists Immediate Treatment: ? Elevate affected extremity (above heart) ? Utilize ice packs (15-60) minutes applications four times per day ? May consider warm pads (can alternate with cold ones) ? For any extravasations exceeding 20 ml (or 10 ml if area involved has little subcutaneous tissue such as  dorsum of hand), contact the Physician Assistant to evaluate (if on site). If no PA on site, contact the  Radiologist on site. If there is no Radiologist on site, contact covering physician at site. ? Patient observation (including extravasation site) for 2-4 hours must occur by medical personnel if more  than 20 ml extravasated. If the imaging site is closing before the 2-4 hour window of observation has been  completed, the radiologist on site should evaluate the patient and decide if the patient can be discharged  home or needs further observation elsewhere. Indications for Plastic Surgery Consultants: ? If during the 2-4 hours of observation the patient develops any of the following: *Blistering/ulceration of skin *Decreased pulses distal to site of extravasation *Decrease in capillary refill *Altered sensation *Change in skin color *Increasing pain or sign of increased swelling *Decrease in motor function or feeling of severe tightness ? Consider for large volume extravasation (greater than 100 ml)  ? Extreme caution should be used when the extravasation occurs in the dorsum of the hand or any area  where there is little subcutaneous tissue. Plastic surgery may need to be involved with even low volume  extravasations in these sites. Follow Up Contact by Tech or Physician Assistant: ? When deemed necessary by initial consulting PA or physician, 24-30 hour follow up with patient must be  performed (by telephone or in person). This can be activated through communications portal in [employer]. At   follow up, assess the following: *Residual pain *Blistering or ulceration *Redness or other skin color change *Altered consistency or sensation ? Additional daily follow up should be considered until symptoms resolve. Documentation Process: ? The PA or Radiologist called to assess the patient should dictate in the radiology report the results of their  evaluation and follow up recommendations. There is a macro in Fluency (contrast extravasation  consultation) that covers this. This can be done as an addendum to report if treating party does not dictate  the case. ? The IV Contrast Extravasation Report or facility's Incident Report must be completed by technologist on any  extravasation greater than 20 ml (or 10 ml if the area has little subcutaneous tissue, example dorsum of  hand). In these incidents, the referring physician should be notified. ? All documentation must be entered in the patient's EMR with detailed follow-up by Technologist or PA  involved.  ? A copy of the IV Contrast Extravasation Report or Incident Report must be submitted to Site  Manager/Director within 24 hours. ? Inpatients and Emergency department patients in [facility] should have contrast extravasation order set  completed. ? Outpatients should be given FAQ form which includes contact information should other questions arise

## 2022-09-09 NOTE — Progress Notes (Signed)
Neurosurgery Service Progress Note  Subjective: No acute events overnight, no headache, no complaints this morning   Objective: Vitals:   09/09/22 0630 09/09/22 0700 09/09/22 0800 09/09/22 0804  BP: (!) 148/69 (!) 149/61  109/65  Pulse: 79 83  78  Resp: 18 14  18   Temp:   97.9 F (36.6 C)   TempSrc:   Oral   SpO2: 92% 95%  95%  Weight:      Height:        Physical Exam: Aox3, PERRL, EOMI, FS except periorbital ecchymosis / R scalp hematoma, Fcx4 without preference  Assessment & Plan: 87 y.o. man on clopidogrel s/p fall with R small SDH and R MCP cerebellar ICH.   -Rpt CTH stable, will defer to neurology if they want to restart plavix later, but from a subdural standpoint he can restart it in one week -okay for DVT chemoPPx starting tomorrow from my standpoint -okay to transfer out of the unit from a subdural standpoint   Jadene Pierini  09/09/22 9:10 AM

## 2022-09-10 ENCOUNTER — Inpatient Hospital Stay (HOSPITAL_COMMUNITY): Payer: HMO

## 2022-09-10 DIAGNOSIS — I614 Nontraumatic intracerebral hemorrhage in cerebellum: Secondary | ICD-10-CM | POA: Diagnosis not present

## 2022-09-10 DIAGNOSIS — I6389 Other cerebral infarction: Secondary | ICD-10-CM | POA: Diagnosis not present

## 2022-09-10 LAB — CBC
HCT: 39.1 % (ref 39.0–52.0)
Hemoglobin: 13.3 g/dL (ref 13.0–17.0)
MCH: 30.1 pg (ref 26.0–34.0)
MCHC: 34 g/dL (ref 30.0–36.0)
MCV: 88.5 fL (ref 80.0–100.0)
Platelets: 155 10*3/uL (ref 150–400)
RBC: 4.42 MIL/uL (ref 4.22–5.81)
RDW: 13.7 % (ref 11.5–15.5)
WBC: 7.5 10*3/uL (ref 4.0–10.5)
nRBC: 0 % (ref 0.0–0.2)

## 2022-09-10 LAB — HEMOGLOBIN A1C
Hgb A1c MFr Bld: 5.4 % (ref 4.8–5.6)
Mean Plasma Glucose: 108.28 mg/dL

## 2022-09-10 LAB — BASIC METABOLIC PANEL
Anion gap: 11 (ref 5–15)
BUN: 8 mg/dL (ref 8–23)
CO2: 22 mmol/L (ref 22–32)
Calcium: 9.5 mg/dL (ref 8.9–10.3)
Chloride: 105 mmol/L (ref 98–111)
Creatinine, Ser: 0.88 mg/dL (ref 0.61–1.24)
GFR, Estimated: >60 mL/min (ref >60)
Glucose, Bld: 136 mg/dL — ABNORMAL HIGH (ref 70–99)
Potassium: 3.4 mmol/L — ABNORMAL LOW (ref 3.5–5.1)
Sodium: 138 mmol/L (ref 135–145)

## 2022-09-10 LAB — ECHOCARDIOGRAM COMPLETE
AR max vel: 1.78 cm2
AV Area VTI: 2.07 cm2
AV Area mean vel: 1.79 cm2
AV Mean grad: 6.5 mmHg
AV Peak grad: 12.2 mmHg
Ao pk vel: 1.75 m/s
Area-P 1/2: 4.49 cm2
Height: 72 in
S' Lateral: 3.3 cm
Weight: 3269.86 oz

## 2022-09-10 LAB — MAGNESIUM: Magnesium: 1.8 mg/dL (ref 1.7–2.4)

## 2022-09-10 LAB — PHOSPHORUS: Phosphorus: 2.9 mg/dL (ref 2.5–4.6)

## 2022-09-10 MED ORDER — PANTOPRAZOLE SODIUM 40 MG PO TBEC
40.0000 mg | DELAYED_RELEASE_TABLET | Freq: Every day | ORAL | Status: DC
  Administered 2022-09-10 – 2022-09-12 (×3): 40 mg via ORAL
  Filled 2022-09-10 (×3): qty 1

## 2022-09-10 MED ORDER — ENOXAPARIN SODIUM 40 MG/0.4ML IJ SOSY
40.0000 mg | PREFILLED_SYRINGE | INTRAMUSCULAR | Status: DC
  Administered 2022-09-10 – 2022-09-12 (×3): 40 mg via SUBCUTANEOUS
  Filled 2022-09-10 (×3): qty 0.4

## 2022-09-10 MED ORDER — QUETIAPINE FUMARATE 50 MG PO TABS
50.0000 mg | ORAL_TABLET | Freq: Every day | ORAL | Status: DC
  Administered 2022-09-10 – 2022-09-12 (×3): 50 mg via ORAL
  Filled 2022-09-10: qty 1
  Filled 2022-09-10: qty 2
  Filled 2022-09-10: qty 1

## 2022-09-10 MED ORDER — POTASSIUM CHLORIDE 20 MEQ PO PACK
20.0000 meq | PACK | Freq: Once | ORAL | Status: AC
  Administered 2022-09-10: 20 meq via ORAL
  Filled 2022-09-10: qty 1

## 2022-09-10 MED ORDER — LORAZEPAM 2 MG/ML IJ SOLN
1.0000 mg | Freq: Once | INTRAMUSCULAR | Status: AC
  Administered 2022-09-10: 1 mg via INTRAVENOUS
  Filled 2022-09-10: qty 1

## 2022-09-10 NOTE — TOC CAGE-AID Note (Signed)
Transition of Care [facility]) - CAGE-AID Screening  Patient Details  Name: Timothy Arias MRN: [id number] Date of Birth: 21-Nov-1935  Clinical Narrative:  Patient to ED after a traumatic fall at home while walking to the mailbox. Patient endorses drinking alcohol, but no alcohol abuse. Denies any illicit drug use. Patient denies need for substance abuse resources.  CAGE-AID Screening:   Have You Ever Felt You Ought to Cut Down on Your Drinking or Drug Use?: No Have People Annoyed You By Critizing Your Drinking Or Drug Use?: No Have You Felt Bad Or Guilty About Your Drinking Or Drug Use?: No Have You Ever Had a Drink or Used Drugs First Thing In The Morning to Steady Your Nerves or to Get Rid of a Hangover?: No CAGE-AID Score: 0  Substance Abuse Education Offered: No

## 2022-09-10 NOTE — Evaluation (Signed)
Speech Language Pathology Evaluation Patient Details Name: Timothy Arias MRN: [id number] DOB: 11/30/1935 Today's Date: 09/10/2022 Time: 9562-1308 SLP Time Calculation (min) (ACUTE ONLY): 15 min  Problem List:  Patient Active Problem List   Diagnosis Date Noted   ICH (intracerebral hemorrhage) (HCC) 09/08/2022   Acute lower limb ischemia 06/26/2022   Osteoarthritis of knee 08/29/2018   Trochanteric bursitis of left hip 08/29/2018   Ischemic heart disease 01/23/2014   Hypercholesterolemia 01/23/2014   Essential hypertension 01/23/2014   Paresthesias/numbness 01/23/2014   Osteoarthritis of both knees 01/23/2014   Dyspnea on exertion 01/23/2014   Past Medical History:  Past Medical History:  Diagnosis Date   BPH (benign prostatic hyperplasia)    Colon polyps    Coronary artery disease    Dyslipidemia    ED (erectile dysfunction)    Hydrocele    Hyperlipidemia    Hypertension    MI (myocardial infarction) (HCC) 1992   Nocturnal enuresis    Past Surgical History:  Past Surgical History:  Procedure Laterality Date   ABDOMINAL AORTOGRAM W/LOWER EXTREMITY Bilateral 08/19/2021   Procedure: ABDOMINAL AORTOGRAM W/LOWER EXTREMITY;  Surgeon: Leonie Douglas, MD;  Location: [facility] INVASIVE CV LAB;  Service: Cardiovascular;  Laterality: Bilateral;   COLONOSCOPY W/ POLYPECTOMY     CORONARY ANGIOPLASTY     with stents   KNEE ARTHROSCOPY     PERIPHERAL VASCULAR INTERVENTION  08/19/2021   Procedure: PERIPHERAL VASCULAR INTERVENTION;  Surgeon: Leonie Douglas, MD;  Location: [facility] INVASIVE CV LAB;  Service: Cardiovascular;;  Right Iliac   PERIPHERAL VASCULAR INTERVENTION Bilateral 06/27/2022   Procedure: PERIPHERAL VASCULAR INTERVENTION;  Surgeon: Nada Libman, MD;  Location: [facility] INVASIVE CV LAB;  Service: Cardiovascular;  Laterality: Bilateral;  [place]   PERIPHERAL VASCULAR THROMBECTOMY N/A 06/26/2022   Procedure: PERIPHERAL VASCULAR THROMBECTOMY;  Surgeon: Victorino Sparrow, MD;  Location: [facility]  INVASIVE CV LAB;  Service: Cardiovascular;  Laterality: N/A;   PERIPHERAL VASCULAR THROMBECTOMY N/A 06/27/2022   Procedure: LYSIS RECHECK;  Surgeon: Nada Libman, MD;  Location: [facility] INVASIVE CV LAB;  Service: Cardiovascular;  Laterality: N/A;   RECTAL VILLOUS ADENOMA EXCISION     VASECTOMY     HPI:  Patient is an 87 y.o. male with PMH: ischemic heart disease, HTN, dementia, acute lower limb ischemia s/p stenting. He presented to the hospital on 09/08/22 as a level 2 trauma after falling outside while walking to mailbox (witnessed by housekeeper). During hospital admission, patient's spouse reported that she has noticed some shuffling gait when he walks recently and he has not been eating or drinking as much but has been taking all of his prescribed medications.   Assessment / Plan / Recommendation Clinical Impression  Speech-language and cognitive evaluation was limited secondary to patient's lethargy. Neurologist who was present after SLP evaluation reported that previous date, patient was much more interactive and he feels that current lethargy is from sedating medications. No family present but per chart review, patient has diagnosis of dementia. Patient with eyes closed when SLP arrived, but he awakened to voice quickly. He was not able to state his name at first but was able to correctly identify his last name via yes/no response. He then was able to state his first name. He was not oriented to place, time or situation. He followed a few one-step verbal directions with cues to attend and repetition. He required setup and some assistance with holding a cup and drinking liquids via straw. He would quickly start sleeping and snoring if  not being interacted with. SLP will follow patient while admitted with goal of ongoing diagnostic assessment to determine if he is at or near his baseline for cognitive function.    SLP Assessment  SLP Recommendation/Assessment: Patient needs continued Speech Lanaguage  Pathology Services SLP Visit Diagnosis: Cognitive communication deficit (R41.841)    Recommendations for follow up therapy are one component of a multi-disciplinary discharge planning process, led by the attending physician.  Recommendations may be updated based on patient status, additional functional criteria and insurance authorization.    Follow Up Recommendations  Follow physician's recommendations for discharge plan and follow up therapies    Assistance Recommended at Discharge  Frequent or constant Supervision/Assistance  Functional Status Assessment Patient has had a recent decline in their functional status and demonstrates the ability to make significant improvements in function in a reasonable and predictable amount of time.  Frequency and Duration min 1 x/week  1 week      SLP Evaluation Cognition  Overall Cognitive Status: Impaired/Different from baseline Arousal/Alertness: Lethargic Orientation Level: Oriented to person;Disoriented to place;Disoriented to time;Disoriented to situation       Comprehension  Auditory Comprehension Overall Auditory Comprehension: Impaired Commands: Impaired One Step Basic Commands: 25-49% accurate Interfering Components: Processing speed;Working memory EffectiveTechniques: Repetition;Slowed speech Visual Recognition/Discrimination Discrimination: Not tested Reading Comprehension Reading Status: Not tested    Expression Expression Primary Mode of Expression: Verbal Verbal Expression Overall Verbal Expression: Other (comment) (unable to adequately assess secondary to lethargy)   Oral / Motor  Oral Motor/Sensory Function Overall Oral Motor/Sensory Function: Within functional limits Motor Speech Overall Motor Speech: Appears within functional limits for tasks assessed Respiration: Within functional limits            Angela Nevin, MA, CCC-SLP Speech Therapy

## 2022-09-10 NOTE — Progress Notes (Signed)
  Echocardiogram 2D Echocardiogram has been performed.  Timothy Arias 09/10/2022, 11:41 AM

## 2022-09-10 NOTE — Progress Notes (Addendum)
STROKE TEAM PROGRESS NOTE   INTERVAL HISTORY  No family at bedside.  Patient received Ativan overnight due to agitation.  This morning he is more somnolent.  Initial plan was to go to MRI but only if he was alert due to some metal in his body.  However we will have to put this on hold for now.  Will transfer out of the ICU.  DC Ativan.  Low potassium replaced.  Vitals:   09/10/22 0616 09/10/22 0700 09/10/22 0800 09/10/22 1200  BP: (!) 150/71 133/83    Pulse: 80 78    Resp: 12 13    Temp:   97.6 F (36.4 C) 97.7 F (36.5 C)  TempSrc:   Axillary Axillary  SpO2: 95% 97%    Weight:      Height:       CBC:  Recent Labs  Lab 09/08/22 1439 09/08/22 1509 09/10/22 0658  WBC 6.9  --  7.5  HGB 12.7* 13.3 13.3  HCT 38.8* 39.0 39.1  MCV 91.3  --  88.5  PLT 154  --  155    Basic Metabolic Panel:  Recent Labs  Lab 09/08/22 1439 09/08/22 1509 09/10/22 0658  NA 140 140 138  K 3.7 3.8 3.4*  CL 105 104 105  CO2 22  --  22  GLUCOSE 157* 155* 136*  BUN 10 13 8   CREATININE 1.01 0.90 0.88  CALCIUM 9.6  --  9.5  MG  --   --  1.8  PHOS  --   --  2.9    Lipid Panel: No results for input(s): "CHOL", "TRIG", "HDL", "CHOLHDL", "VLDL", "LDLCALC" in the last 168 hours. HgbA1c:  Recent Labs  Lab 09/10/22 0658  HGBA1C 5.4   Urine Drug Screen: No results for input(s): "LABOPIA", "COCAINSCRNUR", "LABBENZ", "AMPHETMU", "THCU", "LABBARB" in the last 168 hours.  Alcohol Level  Recent Labs  Lab 09/08/22 1439  ETH <10     IMAGING past 24 hours ECHOCARDIOGRAM COMPLETE  Result Date: 09/10/2022    ECHOCARDIOGRAM REPORT   Patient Name:   Timothy Arias Date of Exam: 09/10/2022 Medical Rec #:  [id number]      Height:       72.0 in Accession #:    [id number]     Weight:       204.4 lb Date of Birth:  1936/04/05      BSA:          2.150 m Patient Age:    86 years       BP:           133/83 mmHg Patient Gender: M              HR:           75 bpm. Exam Location:  Inpatient Procedure: 2D Echo,  Cardiac Doppler and Color Doppler Indications:    stroke  History:        Patient has no prior history of Echocardiogram examinations.                 CAD; Risk Factors:Hypertension and Dyslipidemia.  Sonographer:    Delcie Roch RDCS Referring Phys: [id number] [facility]  Sonographer Comments: Image acquisition challenging due to uncooperative patient. IMPRESSIONS  1. Left ventricular ejection fraction, by estimation, is 55 to 60%. The left ventricle has normal function. The left ventricle has no regional wall motion abnormalities. There is moderate left ventricular hypertrophy. Left ventricular diastolic parameters were normal.  2. Right ventricular  systolic function is normal. The right ventricular size is normal. There is normal pulmonary artery systolic pressure. The estimated right ventricular systolic pressure is 31.7 mmHg.  3. The mitral valve is normal in structure. No evidence of mitral valve regurgitation. No evidence of mitral stenosis.  4. The aortic valve is tricuspid. Aortic valve regurgitation is not visualized. Aortic valve sclerosis/calcification is present, without any evidence of aortic stenosis.  5. The inferior vena cava is normal in size with greater than 50% respiratory variability, suggesting right atrial pressure of 3 mmHg. FINDINGS  Left Ventricle: Left ventricular ejection fraction, by estimation, is 55 to 60%. The left ventricle has normal function. The left ventricle has no regional wall motion abnormalities. The left ventricular internal cavity size was normal in size. There is  moderate left ventricular hypertrophy. Left ventricular diastolic parameters were normal. Right Ventricle: The right ventricular size is normal. No increase in right ventricular wall thickness. Right ventricular systolic function is normal. There is normal pulmonary artery systolic pressure. The tricuspid regurgitant velocity is 2.68 m/s, and  with an assumed right atrial pressure of 3 mmHg, the estimated  right ventricular systolic pressure is 31.7 mmHg. Left Atrium: Left atrial size was normal in size. Right Atrium: Right atrial size was normal in size. Pericardium: There is no evidence of pericardial effusion. Mitral Valve: The mitral valve is normal in structure. No evidence of mitral valve regurgitation. No evidence of mitral valve stenosis. Tricuspid Valve: The tricuspid valve is normal in structure. Tricuspid valve regurgitation is trivial. Aortic Valve: The aortic valve is tricuspid. Aortic valve regurgitation is not visualized. Aortic valve sclerosis/calcification is present, without any evidence of aortic stenosis. Aortic valve mean gradient measures 6.5 mmHg. Aortic valve peak gradient measures 12.2 mmHg. Aortic valve area, by VTI measures 2.07 cm. Pulmonic Valve: The pulmonic valve was not well visualized. Pulmonic valve regurgitation is not visualized. Aorta: The aortic root and ascending aorta are structurally normal, with no evidence of dilitation. Venous: The inferior vena cava is normal in size with greater than 50% respiratory variability, suggesting right atrial pressure of 3 mmHg. IAS/Shunts: The interatrial septum was not well visualized.  LEFT VENTRICLE PLAX 2D LVIDd:         4.70 cm   Diastology LVIDs:         3.30 cm   LV e' medial:    7.29 cm/s LV PW:         1.30 cm   LV E/e' medial:  9.2 LV IVS:        1.50 cm   LV e' lateral:   9.36 cm/s LVOT diam:     2.20 cm   LV E/e' lateral: 7.1 LV SV:         73 LV SV Index:   34 LVOT Area:     3.80 cm  RIGHT VENTRICLE             IVC RV S prime:     14.30 cm/s  IVC diam: 1.80 cm TAPSE (M-mode): 2.4 cm LEFT ATRIUM             Index        RIGHT ATRIUM           Index LA diam:        3.90 cm 1.81 cm/m   RA Area:     12.70 cm LA Vol (A2C):   62.3 ml 28.97 ml/m  RA Volume:   28.70 ml  13.35 ml/m LA Vol (A4C):  55.5 ml 25.81 ml/m LA Biplane Vol: 63.2 ml 29.39 ml/m  AORTIC VALVE AV Area (Vmax):    1.78 cm AV Area (Vmean):   1.79 cm AV Area (VTI):      2.07 cm AV Vmax:           174.50 cm/s AV Vmean:          117.000 cm/s AV VTI:            0.351 m AV Peak Grad:      12.2 mmHg AV Mean Grad:      6.5 mmHg LVOT Vmax:         81.75 cm/s LVOT Vmean:        55.000 cm/s LVOT VTI:          0.191 m LVOT/AV VTI ratio: 0.54  AORTA Ao Root diam: 3.00 cm Ao Asc diam:  3.70 cm MITRAL VALVE               TRICUSPID VALVE MV Area (PHT): 4.49 cm    TR Peak grad:   28.7 mmHg MV Decel Time: 169 msec    TR Vmax:        268.00 cm/s MV E velocity: 66.90 cm/s MV A velocity: 96.80 cm/s  SHUNTS MV E/A ratio:  0.69        Systemic VTI:  0.19 m                            Systemic Diam: 2.20 cm Epifanio Lesches MD Electronically signed by Epifanio Lesches MD Signature Date/Time: 09/10/2022/11:48:18 AM    Final    CT ANGIO HEAD NECK W WO CM  Result Date: 09/10/2022 CLINICAL DATA:  Follow-up for hemorrhagic stroke. EXAM: CT ANGIOGRAPHY HEAD AND NECK WITH AND WITHOUT CONTRAST TECHNIQUE: Multidetector CT imaging of the head and neck was performed using the standard protocol during bolus administration of intravenous contrast. Multiplanar CT image reconstructions and MIPs were obtained to evaluate the vascular anatomy. Carotid stenosis measurements (when applicable) are obtained utilizing NASCET criteria, using the distal internal carotid diameter as the denominator. RADIATION DOSE REDUCTION: This exam was performed according to the departmental dose-optimization program which includes automated exposure control, adjustment of the mA and/or kV according to patient size and/or use of iterative reconstruction technique. CONTRAST:  75mL OMNIPAQUE IOHEXOL 350 MG/ML SOLN COMPARISON:  Prior studies from earlier the same day. FINDINGS: CTA NECK FINDINGS Aortic arch: Visualized arch within normal limits for caliber with standard branch pattern. Moderate atheromatous change about the arch itself. No stenosis about the origin the great vessels. Right carotid system: Right common and  internal carotid arteries are patent without dissection. Moderate atheromatous change about the right carotid bulb/proximal right ICA without hemodynamically significant greater than 50% stenosis. Left carotid system: Left common and internal carotid arteries are patent without dissection. Moderate atheromatous change about the left carotid bulb with associated stenosis of up to 60% by NASCET criteria. Probable small superimposed penetrating plaque noted at this level (series 7, images 199, 198). Vertebral arteries: Both vertebral arteries arise from subclavian arteries. No proximal subclavian artery stenosis. Severe stenosis present at the origin of the left vertebral artery. Vertebral arteries are otherwise patent distally without stenosis or dissection. Skeleton: No discrete or worrisome osseous lesions. Findings consistent with DISH within the visualized spine. Scattered OPLL noted at C2-3 through C6-7. Other neck: No other acute abnormality within the neck. Upper chest: Visualized upper chest demonstrates no acute  finding. Review of the MIP images confirms the above findings CTA HEAD FINDINGS Anterior circulation: Atheromatous change within the carotid siphons without hemodynamically significant stenosis. A1 segments patent bilaterally. Right A1 hypoplastic. Normal anterior communicating complex. Anterior cerebral arteries patent without stenosis. No M1 stenosis or occlusion. No proximal MCA branch occlusion or high-grade stenosis. Distal MCA branches perfused and symmetric. Posterior circulation: Both V4 segments patent without significant stenosis. Both PICA patent. Basilar patent without stenosis. Superior cerebral arteries patent bilaterally. Left PCA supplied via the basilar. Fetal type origin of the right PCA. Mild atheromatous irregularity about the PCAs without hemodynamically significant stenosis. Venous sinuses: Patent allowing for timing the contrast bolus. Anatomic variants: As above. No  intracranial aneurysm. No vascular malformation seen underlying the right cerebral hemorrhage. Review of the MIP images confirms the above findings IMPRESSION: 1. Negative CTA for large vessel occlusion or other emergent finding. No vascular malformation seen underlying the right cerebral hemorrhage. 2. Moderate atheromatous change about the left carotid bulb with associated stenosis of up to 60% by NASCET criteria. Probable small superimposed penetrating plaque as above. 3. 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 Aortic Atherosclerosis (ICD10-I70.0). Electronically Signed   By: Rise Mu M.D.   On: 09/10/2022 01:22   CT HEAD WO CONTRAST ( )  Result Date: 09/09/2022 CLINICAL DATA:  Follow-up examination for intracranial hemorrhage. EXAM: CT HEAD WITHOUT CONTRAST TECHNIQUE: Contiguous axial images were obtained from the base of the skull through the vertex without intravenous contrast. RADIATION DOSE REDUCTION: This exam was performed according to the departmental dose-optimization program which includes automated exposure control, adjustment of the mA and/or kV according to patient size and/or use of iterative reconstruction technique. COMPARISON:  CT from earlier the same day. FINDINGS: Brain: Previously identified intraparenchymal hemorrhage involving the right cerebellum again seen slightly increased in size now measuring up to 12 mm. Minimal localized edema without significant regional mass effect. Subdural hematoma overlying the posterior right cerebral convexity is little interval changed measuring up to 4 mm in maximal thickness. No significant mass effect or midline shift. No new intracranial hemorrhage. No other acute large vessel territory infarct. No mass lesion or midline shift. No hydrocephalus. Vascular: No abnormal hyperdense vessel. Scattered calcified atherosclerosis present at the skull base. Skull: Evolving right frontal/periorbital soft tissue contusion. Few  small superimposed radiopaque foreign bodies again noted. Calvarium intact. Sinuses/Orbits: Globes and orbital soft tissues demonstrate no other acute finding. Ocular senescent calcifications noted. Hypoplastic right maxillary sinus noted. Scattered mucoperiosteal thickening present elsewhere throughout the paranasal sinuses. No mastoid effusion. Other: None. IMPRESSION: 1. Slight interval increase in size of 12 mm right cerebellar hemorrhage. Minimal localized edema without significant regional mass effect. 2. Unchanged subdural hematoma overlying the posterior right cerebral convexity measuring up to 4 mm in maximal thickness. No significant mass effect or midline shift. 3. No other new acute intracranial abnormality. 4. Evolving right frontal/periorbital soft tissue contusion. Electronically Signed   By: Rise Mu M.D.   On: 09/09/2022 20:12    PHYSICAL EXAM  Temp:  [97.6 F (36.4 C)-98.8 F (37.1 C)] 97.7 F (36.5 C) (05/19 1200) Pulse Rate:  [74-104] 78 (05/19 0700) Resp:  [12-32] 13 (05/19 0700) BP: (100-183)/(63-124) 133/83 (05/19 0700) SpO2:  [93 %-99 %] 97 % (05/19 0700)  General - Well nourished, well developed, in no apparent distress. Cardiovascular - Regular rhythm and rate.  Mental Status -  Mental status: Lethargic.  Arousable to voice and  stimuli but not oriented.  Can still  follow commands but delayed.  HEENT: Bandage placed on the right side of his head  Cranial Nerves II - XII - II - Visual field intact OU. III, IV, VI - Extraocular movements intact. V - Facial sensation intact bilaterally. VII - Facial movement intact bilaterally. VIII - Hearing & vestibular intact bilaterally. X - Palate elevates symmetrically. XI - Chin turning & shoulder shrug intact bilaterally. XII - Tongue protrusion intact.  Motor Strength - The patient's strength was normal in all extremities and pronator drift was absent.  Bulk was normal and fasciculations were absent.   Motor  Tone - Muscle tone was assessed at the neck and appendages and was normal.  Sensory - Light touch, temperature/pinprick were assessed and were symmetrical.    Coordination - The patient had normal movements in the hands and feet with no ataxia or dysmetria.  Tremor was absent.  Gait and Station - deferred.  ASSESSMENT/PLAN Timothy Arias is a 87 y.o. male with history of ischemic heart disease, HTN, acute lower limb ischemia s/p stenting who initially presented as a level 2 trauma. He was walking to the mailbox when he fell. Housekeeper saw him on the ground outside and yelled for patients wife to call EMS. When she got outside he was laying on the ground with blood coming from his head. He remembers the entire event, states his legs came out from underneath him. Does not remember tripping over anything, but does not know exactly why he fell. His wife tells me that she has noticed him shuffling when he walks recently.   Stroke: Acute right traumatic cerebellar hemorrhage, acute right subdural hematoma Etiology: Likely due to trauma CT head right cerebellar ICH, acute right subdural hematoma Repeat CT head stable CTA head & neck ordered MRI ordered Neurosurgery consulted no interventions at this time 2D Echo ordered, results pending LDL 143 HgbA1c 5.4 VTE prophylaxis -SCDs    Diet   Diet heart healthy/carb modified Room service appropriate? Yes; Fluid consistency: Thin   Aspirin 325 mg and Plavix 75 mg prior to admission, now on no antithrombotics.  Will consider restarting in about 7 days after repeat CT head is stable Therapy recommendations: Pending Disposition: Pending  Hypertension CAD Home meds: Norvasc 5 mg, metoprolol 50 mg Stable BP goal 1 30-1 50 As needed labetalol and hydralazine to maintain blood pressure Will restart home amlodipine Long-term BP goal normotensive  Hyperlipidemia Home meds: Atorvastatin 80 mg, not resumed in hospital LDL 143, goal <  70 Consider switching to Crestor Consider restarting on discharge.  Dementia Restart home Namenda and galantamine  Other Stroke Risk Factors Advanced Age >/= 103  Former cigarette smoker Coronary artery disease  Other Active Problems BPH Anemia-Hgb 12.7 will monitor Low potassium replaced today  Hospital day # 2  Right subdural hemorrhage and right cerebellar ICH that appears to be stable.  More sedated this morning due to Ativan administration overnight.  Will have to hold on MRI for now.  Transfer out of the ICU.  Will start DVT prophylaxis with Lovenox.  Hold antiplatelets for now and consider restarting in 5 days.   This patient is critically ill due to subdural hemorrhage with intraparenchymal hemorrhage and at significant risk of neurological worsening, death form heart failure, respiratory failure, recurrent stroke, bleeding from [facility], seizure, sepsis. This patient's care requires constant monitoring of vital signs, hemodynamics, respiratory and cardiac monitoring, review of multiple databases, neurological assessment, discussion with family, other specialists and medical decision making of high complexity.  I spent 35 minutes of neurocritical care time in the care of this patient.   Bowie Delia,MD    To contact Stroke Continuity provider, please refer to [email or web address]. After hours, contact General Neurology

## 2022-09-10 NOTE — Progress Notes (Signed)
ANTICOAGULATION CONSULT NOTE - Initial Consult  Pharmacy Consult for enoxaparin Indication: VTE prophylaxis  No Known Allergies  Patient Measurements: Height: 6' (182.9 cm) Weight: 92.7 kg (204 lb 5.9 oz) IBW/kg (Calculated) : 77.6   Vital Signs: Temp: 97.7 F (36.5 C) (05/19 1200) Temp Source: Axillary (05/19 1200) BP: 133/83 (05/19 0700) Pulse Rate: 78 (05/19 0700)  Labs: Recent Labs    09/08/22 1439 09/08/22 1509 09/10/22 0658  HGB 12.7* 13.3 13.3  HCT 38.8* 39.0 39.1  PLT 154  --  155  CREATININE 1.01 0.90 0.88    Estimated Creatinine Clearance: 68.6 mL/min (by C-G formula based on SCr of 0.88 mg/dL).   Medical History: Past Medical History:  Diagnosis Date   BPH (benign prostatic hyperplasia)    Colon polyps    Coronary artery disease    Dyslipidemia    ED (erectile dysfunction)    Hydrocele    Hyperlipidemia    Hypertension    MI (myocardial infarction) (HCC) 1992   Nocturnal enuresis      Assessment: 87 yo M with fall and SDH/cerebellar ICH. Neurology consulted for DVT propyhylaxis   Antiplatelet therapies on hold. CBC stable.  Goal of Therapy:  Monitor platelets by anticoagulation protocol: Yes   Plan:  Enoxaparin 40 mg q24hr Monitor for signs/symptoms of bleeding    Alphia Moh, PharmD, BCPS, BCCP Clinical Pharmacist  Please check AMION for all [facility] phone numbers After 10:00 PM, call Main Pharmacy [phone]

## 2022-09-11 DIAGNOSIS — S065XAA Traumatic subdural hemorrhage with loss of consciousness status unknown, initial encounter: Secondary | ICD-10-CM

## 2022-09-11 MED ORDER — DIVALPROEX SODIUM 125 MG PO CSDR
250.0000 mg | DELAYED_RELEASE_CAPSULE | Freq: Two times a day (BID) | ORAL | Status: DC
  Administered 2022-09-11: 250 mg via ORAL
  Filled 2022-09-11: qty 2

## 2022-09-11 MED ORDER — HALOPERIDOL LACTATE 5 MG/ML IJ SOLN: 1.0000 mg | Freq: Once | INTRAMUSCULAR | Status: DC

## 2022-09-11 NOTE — Plan of Care (Signed)
  Problem: Education: Goal: Knowledge of disease or condition will improve Outcome: Progressing Goal: Knowledge of secondary prevention will improve (MUST DOCUMENT ALL) Outcome: Progressing Goal: Knowledge of patient specific risk factors will improve (Mark N/A or DELETE if not current risk factor) Outcome: Progressing   Problem: Intracerebral Hemorrhage Tissue Perfusion: Goal: Complications of Intracerebral Hemorrhage will be minimized Outcome: Progressing   Problem: Coping: Goal: Will verbalize positive feelings about self Outcome: Progressing Goal: Will identify appropriate support needs Outcome: Progressing   Problem: Health Behavior/Discharge Planning: Goal: Ability to manage health-related needs will improve Outcome: Progressing Goal: Goals will be collaboratively established with patient/family Outcome: Progressing   Problem: Self-Care: Goal: Ability to participate in self-care as condition permits will improve Outcome: Progressing Goal: Verbalization of feelings and concerns over difficulty with self-care will improve Outcome: Progressing Goal: Ability to communicate needs accurately will improve Outcome: Progressing   Problem: Nutrition: Goal: Risk of aspiration will decrease Outcome: Progressing Goal: Dietary intake will improve Outcome: Progressing   Problem: Education: Goal: Knowledge of General Education information will improve Description: Including pain rating scale, medication(s)/side effects and non-pharmacologic comfort measures Outcome: Progressing   Problem: Health Behavior/Discharge Planning: Goal: Ability to manage health-related needs will improve Outcome: Progressing   

## 2022-09-11 NOTE — Progress Notes (Signed)
Occupational Therapy Treatment Timothy Arias Details Name: Timothy Arias MRN: [id number] DOB: 1935/11/14 Today's Date: 09/11/2022   History of present illness 87 y.o. male presents to [facility] hospital on 09/08/2022 after a fall, found to have R SDH along with R cerebellar IPH. PMH: BPH, CAD, ED, HLD, HTN, MI, hyperlipidemia, LE ischemia on anticoagulant.   OT comments  Timothy Arias with fair progress toward Timothy Arias focused goals.  Timothy Arias remains unsteady, losing his balance back times two, and needing assist to regain.  Timothy Arias is only needing Min A for mobility and lower body ADL, but given balance deficits, he will need initial 24 hour assist as needed with HH OT.  If Timothy Arias's balance continues to be off, and the spouse cannot provide the needed assist, Timothy Arias would benefit from continued inpatient follow up therapy, <3 hours/day.  OT will continue efforts in the acute setting to address deficits.      Recommendations for follow up therapy are one component of a multi-disciplinary discharge planning process, led by the attending physician.  Recommendations may be updated based on Timothy Arias status, additional functional criteria and insurance authorization.    Assistance Recommended at Discharge Frequent or constant Supervision/Assistance  Timothy Arias can return home with the following  Assist for transportation;Assistance with cooking/housework;A little help with bathing/dressing/bathroom;A little help with walking and/or transfers;Direct supervision/assist for financial management;Direct supervision/assist for medications management   Equipment Recommendations  Tub/shower seat    Recommendations for Other Services      Precautions / Restrictions Precautions Precautions: Fall Restrictions Weight Bearing Restrictions: No       Mobility Bed Mobility Overal bed mobility: Needs Assistance Bed Mobility: Sit to Supine       Sit to supine: Supervision        Transfers Overall transfer level: Needs  assistance Equipment used: Rolling walker (2 wheels) Transfers: Sit to/from Stand, Bed to chair/wheelchair/BSC Sit to Stand: Min guard     Step pivot transfers: Min assist     General transfer comment: assist for balance     Balance Overall balance assessment: Needs assistance Sitting-balance support: Feet supported Sitting balance-Leahy Scale: Good     Standing balance support: Reliant on assistive device for balance Standing balance-Leahy Scale: Poor                             ADL either performed or assessed with clinical judgement   ADL       Grooming: Wash/dry hands;Min guard;Standing           Upper Body Dressing : Set up;Sitting   Lower Body Dressing: Minimal assistance;Sit to/from stand   Toilet Transfer: Minimal assistance;Ambulation;Regular Toilet;Rolling walker (2 wheels)   Toileting- Clothing Manipulation and Hygiene: Supervision/safety;Sitting/lateral lean              Extremity/Trunk Assessment Upper Extremity Assessment Upper Extremity Assessment: Overall WFL for tasks assessed   Lower Extremity Assessment Lower Extremity Assessment: Defer to PT evaluation   Cervical / Trunk Assessment Cervical / Trunk Assessment: Kyphotic                      Cognition Arousal/Alertness: Awake/alert Behavior During Therapy: WFL for tasks assessed/performed Overall Cognitive Status: Impaired/Different from baseline                           Safety/Judgement: Decreased awareness of deficits, Decreased awareness of safety Awareness: Emergent  General Comments Watch HR    Pertinent Vitals/ Pain       Pain Assessment Pain Assessment: No/denies pain Pain Intervention(s): Monitored during session                                                          Frequency  Min 2X/week        Progress Toward Goals  OT Goals(current goals can now be found in the  care plan section)  Progress towards OT goals: Progressing toward goals  Acute Rehab OT Goals OT Goal Formulation: With Timothy Arias Time For Goal Achievement: 09/22/22 Potential to Achieve Goals: Good  Plan Discharge plan remains appropriate    Co-evaluation                 AM-PAC OT "6 Clicks" Daily Activity     Outcome Measure   Help from another person eating meals?: None Help from another person taking care of personal grooming?: A Little Help from another person toileting, which includes using toliet, bedpan, or urinal?: A Little Help from another person bathing (including washing, rinsing, drying)?: A Little Help from another person to put on and taking off regular upper body clothing?: None Help from another person to put on and taking off regular lower body clothing?: A Little 6 Click Score: 20    End of Session Equipment Utilized During Treatment: Gait belt;Rolling walker (2 wheels)  OT Visit Diagnosis: Unsteadiness on feet (R26.81)   Activity Tolerance Timothy Arias tolerated treatment well   Timothy Arias Left in bed;with call bell/phone within reach;with bed alarm set;with family/visitor present;with restraints reapplied   Nurse Communication Mobility status        Time: 1410-1430 OT Time Calculation (min): 20 min  Charges: OT General Charges $OT Visit: 1 Visit OT Treatments $Self Care/Home Management : 8-22 mins  09/11/2022  RP, OTR/L  Acute Rehabilitation Services  Office:  [phone]   Suzanna Obey 09/11/2022, 2:35 PM

## 2022-09-11 NOTE — TOC Initial Note (Signed)
Transition of Care [facility]) - Initial/Assessment Note    Patient Details  Name: Timothy Arias MRN: [id number] Date of Birth: 05-07-1935  [facility] CM/SW Contact:    Mearl Latin, LCSW Phone Number: 09/11/2022, 5:04 PM  Clinical Narrative:                 Transitions of Care following for home health services recommendation.   Expected Discharge Plan: Home w Home Health Services Barriers to Discharge: Continued Medical Work up   Patient Goals and CMS Choice            Expected Discharge Plan and Services   Discharge Planning Services: CM Consult Post Acute Care Choice: Home Health Living arrangements for the past 2 months: Single Family Home                                      Prior Living Arrangements/Services Living arrangements for the past 2 months: Single Family Home Lives with:: Spouse Patient language and need for interpreter reviewed:: Yes        Need for Family Participation in Patient Care: Yes (Comment) Care giver support system in place?: Yes (comment)   Criminal Activity/Legal Involvement Pertinent to Current Situation/Hospitalization: No - Comment as needed  Activities of Daily Living      Permission Sought/Granted                  Emotional Assessment       Orientation: : Oriented to Self Alcohol / Substance Use: Not Applicable Psych Involvement: No (comment)  Admission diagnosis:  ICH (intracerebral hemorrhage) (HCC) [I61.9] Patient Active Problem List   Diagnosis Date Noted   ICH (intracerebral hemorrhage) (HCC) 09/08/2022   Acute lower limb ischemia 06/26/2022   Osteoarthritis of knee 08/29/2018   Trochanteric bursitis of left hip 08/29/2018   Ischemic heart disease 01/23/2014   Hypercholesterolemia 01/23/2014   Essential hypertension 01/23/2014   Paresthesias/numbness 01/23/2014   Osteoarthritis of both knees 01/23/2014   Dyspnea on exertion 01/23/2014   PCP:  Renford Dills, MD Pharmacy:    [facility] [phone] [place], [place] - [address] AT [facility] [address] [place] [address] Phone: [phone] Fax: [phone]     Social Determinants of Health (SDOH) Social History: SDOH Screenings   Food Insecurity: No Food Insecurity (06/27/2022)  Housing: Low Risk  (06/27/2022)  Transportation Needs: No Transportation Needs (06/27/2022)  Utilities: Not At Risk (06/27/2022)  Tobacco Use: Medium Risk (09/08/2022)   SDOH Interventions:     Readmission Risk Interventions     No data to display

## 2022-09-11 NOTE — Progress Notes (Signed)
Patient not clear per radiologist due to metal in right orbital region. Exam could be possibly be performed in the future if patient is alert and oriented.

## 2022-09-11 NOTE — Progress Notes (Addendum)
STROKE TEAM PROGRESS NOTE   INTERVAL HISTORY  No family at the bedside.  PT at the bedside. Patient is awake alert sitting up in chair, home health PT recommended.  He was agitated last night but seems quite calm today Unable to get MRI due to metal in right orbital region. Will get CT with/without in the AM. Awaiting bed to transfer out of ICU.   On exam today, naming intact. Repetition with dysarthria. EOMI, r droop, VFF. FNF intact, good strength b/l. Cognitive impairment at baseline.    Vitals:   09/11/22 0600 09/11/22 0607 09/11/22 0613 09/11/22 0800  BP: (!) 139/56 (!) 139/56 112/62   Pulse: 72 69 64   Resp: 16 13 10    Temp:    (!) 97.2 F (36.2 C)  TempSrc:    Axillary  SpO2: 99% 96% 98%   Weight:      Height:       CBC:  Recent Labs  Lab 09/08/22 1439 09/08/22 1509 09/10/22 0658  WBC 6.9  --  7.5  HGB 12.7* 13.3 13.3  HCT 38.8* 39.0 39.1  MCV 91.3  --  88.5  PLT 154  --  155    Basic Metabolic Panel:  Recent Labs  Lab 09/08/22 1439 09/08/22 1509 09/10/22 0658  NA 140 140 138  K 3.7 3.8 3.4*  CL 105 104 105  CO2 22  --  22  GLUCOSE 157* 155* 136*  BUN 10 13 8   CREATININE 1.01 0.90 0.88  CALCIUM 9.6  --  9.5  MG  --   --  1.8  PHOS  --   --  2.9    Lipid Panel: No results for input(s): "CHOL", "TRIG", "HDL", "CHOLHDL", "VLDL", "LDLCALC" in the last 168 hours. HgbA1c:  Recent Labs  Lab 09/10/22 0658  HGBA1C 5.4    Urine Drug Screen: No results for input(s): "LABOPIA", "COCAINSCRNUR", "LABBENZ", "AMPHETMU", "THCU", "LABBARB" in the last 168 hours.  Alcohol Level  Recent Labs  Lab 09/08/22 1439  ETH <10     IMAGING past 24 hours ECHOCARDIOGRAM COMPLETE  Result Date: 09/10/2022    ECHOCARDIOGRAM REPORT   Patient Name:   Timothy Arias Date of Exam: 09/10/2022 Medical Rec #:  [id number]      Height:       72.0 in Accession #:    [id number]     Weight:       204.4 lb Date of Birth:  1935-07-23      BSA:          2.150 m Patient Age:    86 years        BP:           133/83 mmHg Patient Gender: M              HR:           75 bpm. Exam Location:  Inpatient Procedure: 2D Echo, Cardiac Doppler and Color Doppler Indications:    stroke  History:        Patient has no prior history of Echocardiogram examinations.                 CAD; Risk Factors:Hypertension and Dyslipidemia.  Sonographer:    Delcie Roch RDCS Referring Phys: [id number] Timothy Arias  Sonographer Comments: Image acquisition challenging due to uncooperative patient. IMPRESSIONS  1. Left ventricular ejection fraction, by estimation, is 55 to 60%. The left ventricle has normal function. The left ventricle has no regional wall motion  abnormalities. There is moderate left ventricular hypertrophy. Left ventricular diastolic parameters were normal.  2. Right ventricular systolic function is normal. The right ventricular size is normal. There is normal pulmonary artery systolic pressure. The estimated right ventricular systolic pressure is 31.7 mmHg.  3. The mitral valve is normal in structure. No evidence of mitral valve regurgitation. No evidence of mitral stenosis.  4. The aortic valve is tricuspid. Aortic valve regurgitation is not visualized. Aortic valve sclerosis/calcification is present, without any evidence of aortic stenosis.  5. The inferior vena cava is normal in size with greater than 50% respiratory variability, suggesting right atrial pressure of 3 mmHg. FINDINGS  Left Ventricle: Left ventricular ejection fraction, by estimation, is 55 to 60%. The left ventricle has normal function. The left ventricle has no regional wall motion abnormalities. The left ventricular internal cavity size was normal in size. There is  moderate left ventricular hypertrophy. Left ventricular diastolic parameters were normal. Right Ventricle: The right ventricular size is normal. No increase in right ventricular wall thickness. Right ventricular systolic function is normal. There is normal pulmonary artery  systolic pressure. The tricuspid regurgitant velocity is 2.68 m/s, and  with an assumed right atrial pressure of 3 mmHg, the estimated right ventricular systolic pressure is 31.7 mmHg. Left Atrium: Left atrial size was normal in size. Right Atrium: Right atrial size was normal in size. Pericardium: There is no evidence of pericardial effusion. Mitral Valve: The mitral valve is normal in structure. No evidence of mitral valve regurgitation. No evidence of mitral valve stenosis. Tricuspid Valve: The tricuspid valve is normal in structure. Tricuspid valve regurgitation is trivial. Aortic Valve: The aortic valve is tricuspid. Aortic valve regurgitation is not visualized. Aortic valve sclerosis/calcification is present, without any evidence of aortic stenosis. Aortic valve mean gradient measures 6.5 mmHg. Aortic valve peak gradient measures 12.2 mmHg. Aortic valve area, by VTI measures 2.07 cm. Pulmonic Valve: The pulmonic valve was not well visualized. Pulmonic valve regurgitation is not visualized. Aorta: The aortic root and ascending aorta are structurally normal, with no evidence of dilitation. Venous: The inferior vena cava is normal in size with greater than 50% respiratory variability, suggesting right atrial pressure of 3 mmHg. IAS/Shunts: The interatrial septum was not well visualized.  LEFT VENTRICLE PLAX 2D LVIDd:         4.70 cm   Diastology LVIDs:         3.30 cm   LV e' medial:    7.29 cm/s LV PW:         1.30 cm   LV E/e' medial:  9.2 LV IVS:        1.50 cm   LV e' lateral:   9.36 cm/s LVOT diam:     2.20 cm   LV E/e' lateral: 7.1 LV SV:         73 LV SV Index:   34 LVOT Area:     3.80 cm  RIGHT VENTRICLE             IVC RV S prime:     14.30 cm/s  IVC diam: 1.80 cm TAPSE (M-mode): 2.4 cm LEFT ATRIUM             Index        RIGHT ATRIUM           Index LA diam:        3.90 cm 1.81 cm/m   RA Area:     12.70 cm LA Vol (A2C):  62.3 ml 28.97 ml/m  RA Volume:   28.70 ml  13.35 ml/m LA Vol (A4C):   55.5  ml 25.81 ml/m LA Biplane Vol: 63.2 ml 29.39 ml/m  AORTIC VALVE AV Area (Vmax):    1.78 cm AV Area (Vmean):   1.79 cm AV Area (VTI):     2.07 cm AV Vmax:           174.50 cm/s AV Vmean:          117.000 cm/s AV VTI:            0.351 m AV Peak Grad:      12.2 mmHg AV Mean Grad:      6.5 mmHg LVOT Vmax:         81.75 cm/s LVOT Vmean:        55.000 cm/s LVOT VTI:          0.191 m LVOT/AV VTI ratio: 0.54  AORTA Ao Root diam: 3.00 cm Ao Asc diam:  3.70 cm MITRAL VALVE               TRICUSPID VALVE MV Area (PHT): 4.49 cm    TR Peak grad:   28.7 mmHg MV Decel Time: 169 msec    TR Vmax:        268.00 cm/s MV E velocity: 66.90 cm/s MV A velocity: 96.80 cm/s  SHUNTS MV E/A ratio:  0.69        Systemic VTI:  0.19 m                            Systemic Diam: 2.20 cm Timothy Lesches MD Electronically signed by Timothy Lesches MD Signature Date/Time: 09/10/2022/11:48:18 AM    Final     PHYSICAL EXAM  Temp:  [96.8 F (36 C)-98 F (36.7 C)] 97.2 F (36.2 C) (05/20 0800) Pulse Rate:  [64-85] 64 (05/20 0613) Resp:  [4-18] 10 (05/20 8295) BP: (94-156)/(56-94) 112/62 (05/20 6213) SpO2:  [95 %-99 %] 98 % (05/20 0613)  General - Well nourished, well developed, in no apparent distress. Cardiovascular - Regular rhythm and rate.  Mental Status -  Mental status: Lethargic.  Arousable to voice, follows commands, easily redirectable.  Cognitive impairment, baseline. HEENT: Bandage placed on the right side of his head  Cranial Nerves II - XII - II - Visual field intact OU. III, IV, VI - Extraocular movements intact. V - Facial sensation intact bilaterally. VII - Facial movement intact bilaterally. VIII - Hearing & vestibular intact bilaterally. X - Palate elevates symmetrically. XI - Chin turning & shoulder shrug intact bilaterally. XII - Tongue protrusion intact.  Motor Strength - The patient's strength was normal in all extremities and pronator drift was absent.  Bulk was normal and fasciculations  were absent.   Motor Tone - Muscle tone was assessed at the neck and appendages and was normal.  Sensory - Light touch assessed and symmetrical.    Coordination - The patient had normal movements in the hands and feet with no ataxia or dysmetria.  Tremor was absent.  Gait and Station - deferred.  ASSESSMENT/PLAN Timothy Arias is a 87 y.o. male with history of ischemic heart disease, HTN, acute lower limb ischemia s/p stenting who initially presented as a level 2 trauma. He was walking to the mailbox when he fell. Housekeeper saw him on the ground outside and yelled for patients wife to call EMS. When she got outside he was laying  on the ground with blood coming from his head. He remembers the entire event, states his legs came out from underneath him. Does not remember tripping over anything, but does not know exactly why he fell. His wife tells me that she has noticed him shuffling when he walks recently.   Stroke: Acute right traumatic   acute right subdural hematoma.  Right cerebellar subcortical hemorrhage etiology indeterminate Etiology: Likely due to trauma CT head right cerebellar ICH, acute right subdural hematoma Repeat CT head stable CTA head & neck: Negative CTA for large vessel occlusion or other emergent finding. No vascular malformation seen underlying the right cerebral hemorrhage. Repeat CT with/without: ordered for tomorrow AM.  MRI ordered, unable to get due to metal in orbit.  Neurosurgery consulted no interventions at this time 2D Echo ordered, results pending LDL 143 HgbA1c 5.4 VTE prophylaxis -SCDs    Diet   Diet heart healthy/carb modified Room service appropriate? Yes; Fluid consistency: Thin   Aspirin 325 mg and Plavix 75 mg prior to admission, now on no antithrombotics.  Will consider restarting in about 5-7 days after repeat CT head is stable Therapy recommendations: Pending Disposition: Pending  Hypertension CAD Home meds: Norvasc 5 mg, metoprolol 50  mg Stable BP goal 130-150 As needed labetalol and hydralazine to maintain blood pressure Will restart home amlodipine Long-term BP goal normotensive  Hyperlipidemia Home meds: Atorvastatin 80 mg, not resumed in hospital LDL 143, goal < 70 Consider switching to Crestor Consider restarting on discharge.  Dementia Restart home Namenda and galantamine  Other Stroke Risk Factors Advanced Age >/= 47  Former cigarette smoker Coronary artery disease  Other Active Problems BPH Anemia-Hgb 12.7 will monitor Low potassium replaced today  Hospital day # 3   Pt seen by Neuro NP/APP and later by MD. Note/plan to be edited by MD as needed.    Timothy January, DNP, AGACNP-BC [facility] Please use AMION for contact information & EPIC for messaging.  I have personally obtained history,examined this patient, reviewed notes, independently viewed imaging studies, participated in medical decision making and plan of care.ROS completed by me personally and pertinent positives fully documented  I have made any additions or clarifications directly to the above note. Agree with note above.  Patient has baseline cognitive impairment and remains intermittently agitated and confused but otherwise has no focal deficits.  Blood pressure adequately controlled.  MRI cannot be obtained due to metal in the right orbit.  Repeat CT scan of the head with and without contrast tomorrow.  Mobilize out of bed.  Therapy consults.  Transfer out of ICU.  Continue conservative management for his acute subdural hematoma.  Appreciate neurosurgery consult.  No family available at the bedside for discussion.This patient is critically ill and at significant risk of neurological worsening, death and care requires constant monitoring of vital signs, hemodynamics,respiratory and cardiac monitoring, extensive review of multiple databases, frequent neurological assessment, discussion with family, other specialists and medical  decision making of high complexity.I have made any additions or clarifications directly to the above note.This critical care time does not reflect procedure time, or teaching time or supervisory time of PA/NP/Med Resident etc but could involve care discussion time.  I spent 30 minutes of neurocritical care time  in the care of  this patient.      Timothy Heady, MD Medical Director [facility] Pager: [phone] 09/11/2022 4:26 PM    To contact Stroke Continuity provider, please refer to [email or web address]. After hours, contact General  Neurology

## 2022-09-11 NOTE — Progress Notes (Signed)
Physical Therapy Treatment Patient Details Name: Timothy Arias MRN: [id number] DOB: 1935-10-07 Today's Date: 09/11/2022   History of Present Illness 87 y.o. male presents to [facility] hospital on 09/08/2022 after a fall, found to have R SDH along with R cerebellar IPH. PMH: BPH, CAD, ED, HLD, HTN, MI, hyperlipidemia, LE ischemia on anticoagulant.    PT Comments    Patient progressing with ambulation and balance though needing close guard for toileting and hand washing with less UE support.  Patient not oriented to place and did not remember walking with therapist this weekend, though remembers his fall at home.  If spouse able to provide close S for safety mobility at home continue to recommend HHPT at d/c and use of RW for balance at home.  PT will follow up to practice stairs prior to d/c.   Recommendations for follow up therapy are one component of a multi-disciplinary discharge planning process, led by the attending physician.  Recommendations may be updated based on patient status, additional functional criteria and insurance authorization.  Follow Up Recommendations       Assistance Recommended at Discharge Set up Supervision/Assistance  Patient can return home with the following A little help with bathing/dressing/bathroom;Assistance with cooking/housework;Assist for transportation;Help with stairs or ramp for entrance   Equipment Recommendations  None recommended by PT    Recommendations for Other Services       Precautions / Restrictions Precautions Precautions: Fall     Mobility  Bed Mobility Overal bed mobility: Needs Assistance Bed Mobility: Supine to Sit     Supine to sit: Supervision, HOB elevated     General bed mobility comments: increased time, cues needed as thought he was still tied with posey    Transfers Overall transfer level: Needs assistance Equipment used: Rolling walker (2 wheels) Transfers: Sit to/from Stand Sit to Stand: Min guard            General transfer comment: assist for balance    Ambulation/Gait   Gait Distance (Feet): 400 Feet Assistive device: Rolling walker (2 wheels) Gait Pattern/deviations: Step-through pattern, Decreased stride length       General Gait Details: decreased foot clearance bilaterally; assist for safety/balance   Stairs             Wheelchair Mobility    Modified Rankin (Stroke Patients Only) Modified Rankin (Stroke Patients Only) Pre-Morbid Rankin Score: Moderate disability Modified Rankin: Moderately severe disability     Balance Overall balance assessment: Needs assistance Sitting-balance support: Feet supported Sitting balance-Leahy Scale: Good     Standing balance support: Single extremity supported, Reliant on assistive device for balance Standing balance-Leahy Scale: Poor Standing balance comment: UE support for balance                            Cognition Arousal/Alertness: Awake/alert Behavior During Therapy: WFL for tasks assessed/performed Overall Cognitive Status: Impaired/Different from baseline Area of Impairment: Safety/judgement, Awareness                         Safety/Judgement: Decreased awareness of deficits Awareness: Emergent   General Comments: h/o dementia at baseline, but functional, driving with wife present        Exercises      General Comments General comments (skin integrity, edema, etc.): standing to toilet in bathroom with RW and close S; minguard for hand washing for balance; HR 127 with ambulation; MD in during session  Pertinent Vitals/Pain Pain Assessment Pain Assessment: No/denies pain    Home Living                          Prior Function            PT Goals (current goals can now be found in the care plan section) Progress towards PT goals: Progressing toward goals    Frequency    Min 4X/week      PT Plan Current plan remains appropriate    Co-evaluation               AM-PAC PT "6 Clicks" Mobility   Outcome Measure  Help needed turning from your back to your side while in a flat bed without using bedrails?: A Little Help needed moving from lying on your back to sitting on the side of a flat bed without using bedrails?: A Little Help needed moving to and from a bed to a chair (including a wheelchair)?: A Little Help needed standing up from a chair using your arms (e.g., wheelchair or bedside chair)?: A Little Help needed to walk in hospital room?: A Little Help needed climbing 3-5 steps with a railing? : Total 6 Click Score: 16    End of Session Equipment Utilized During Treatment: Gait belt Activity Tolerance: Patient tolerated treatment well Patient left: in chair;with chair alarm set;with restraints reapplied   PT Visit Diagnosis: Other abnormalities of gait and mobility (R26.89);Muscle weakness (generalized) (M62.81)     Time: 0935-1000 PT Time Calculation (min) (ACUTE ONLY): 25 min  Charges:  $Gait Training: 8-22 mins $Therapeutic Activity: 8-22 mins                     Sheran Lawless, PT Acute Rehabilitation Services [phone] 09/11/2022    Timothy Arias 09/11/2022, 12:56 PM

## 2022-09-12 ENCOUNTER — Inpatient Hospital Stay (HOSPITAL_COMMUNITY): Payer: HMO

## 2022-09-12 DIAGNOSIS — I614 Nontraumatic intracerebral hemorrhage in cerebellum: Secondary | ICD-10-CM | POA: Diagnosis not present

## 2022-09-12 MED ORDER — HALOPERIDOL LACTATE 5 MG/ML IJ SOLN
2.0000 mg | Freq: Once | INTRAMUSCULAR | Status: AC
  Administered 2022-09-12: 2 mg via INTRAVENOUS
  Filled 2022-09-12: qty 1

## 2022-09-12 NOTE — TOC Transition Note (Signed)
Transition of Care [facility]) - CM/SW Discharge Note   Patient Details  Name: Timothy Arias MRN: [id number] Date of Birth: 1936/04/13  Transition of Care [facility]) CM/SW Contact:  Kermit Balo, RN Phone Number: 09/12/2022, 2:47 PM   Clinical Narrative:    Patient is from home with his spouse. Current recommendations are for home health services. Wife didn't have a preference. Home health arranged with [facility]. Information on the AVS.  DME at home: walker/ cane Wife manages his medications at home and denies any issues. [facility]acy[facility] on [address] Spouse does drive some but wife says she can provide all needed transportation. Spouse will transport home when discharged.   Final next level of care: Home w Home Health Services Barriers to Discharge: No Barriers Identified   Patient Goals and CMS Choice CMS Medicare.gov Compare Post Acute Care list provided to:: Patient Choice offered to / list presented to : Patient  Discharge Placement                         Discharge Plan and Services Additional resources added to the After Visit Summary for     Discharge Planning Services: CM Consult Post Acute Care Choice: Home Health                    HH Arranged: PT, OT, Speech Therapy HH Agency: [facility] Date [facility] Agency Contacted: 09/12/22   Representative spoke with at [facility] Agency: Ephriam Knuckles  Social Determinants of Health (SDOH) Interventions SDOH Screenings   Food Insecurity: No Food Insecurity (06/27/2022)  Housing: Low Risk  (06/27/2022)  Transportation Needs: No Transportation Needs (06/27/2022)  Utilities: Not At Risk (06/27/2022)  Tobacco Use: Medium Risk (09/08/2022)     Readmission Risk Interventions     No data to display

## 2022-09-12 NOTE — Progress Notes (Signed)
STROKE TEAM PROGRESS NOTE   INTERVAL HISTORY  His wife is at the bedside.  She states he does not have any metal in his eyes and it may be safe to do MRI .patient is awake alert sitting up in chair, home health PT recommended.  Patient seems quite calm today.  Wife feels he may be back to his baseline.  He does have mild dementia Vital signs stable.  Neurological exam unchanged   Vitals:   09/12/22 0004 09/12/22 0313 09/12/22 0801 09/12/22 1106  BP: 130/77 (!) 134/93 (!) 156/76 121/82  Pulse: 91 (!) 109 92 (!) 105  Resp: 16 17 18 18   Temp: 98.2 F (36.8 C) 98.6 F (37 C)  97.8 F (36.6 C)  TempSrc: Oral Oral  Oral  SpO2: 96% 96% 96% 94%  Weight:      Height:       CBC:  Recent Labs  Lab 09/08/22 1439 09/08/22 1509 09/10/22 0658  WBC 6.9  --  7.5  HGB 12.7* 13.3 13.3  HCT 38.8* 39.0 39.1  MCV 91.3  --  88.5  PLT 154  --  155   Basic Metabolic Panel:  Recent Labs  Lab 09/08/22 1439 09/08/22 1509 09/10/22 0658  NA 140 140 138  K 3.7 3.8 3.4*  CL 105 104 105  CO2 22  --  22  GLUCOSE 157* 155* 136*  BUN 10 13 8   CREATININE 1.01 0.90 0.88  CALCIUM 9.6  --  9.5  MG  --   --  1.8  PHOS  --   --  2.9   Lipid Panel: No results for input(s): "CHOL", "TRIG", "HDL", "CHOLHDL", "VLDL", "LDLCALC" in the last 168 hours. HgbA1c:  Recent Labs  Lab 09/10/22 0658  HGBA1C 5.4   Urine Drug Screen: No results for input(s): "LABOPIA", "COCAINSCRNUR", "LABBENZ", "AMPHETMU", "THCU", "LABBARB" in the last 168 hours.  Alcohol Level  Recent Labs  Lab 09/08/22 1439  ETH <10    IMAGING past 24 hours No results found.  PHYSICAL EXAM  Temp:  [97.7 F (36.5 C)-98.6 F (37 C)] 97.8 F (36.6 C) (05/21 1106) Pulse Rate:  [76-109] 105 (05/21 1106) Resp:  [10-18] 18 (05/21 1106) BP: (121-156)/(74-93) 121/82 (05/21 1106) SpO2:  [94 %-98 %] 94 % (05/21 1106)  General - Well nourished, well developed, in no apparent distress. Cardiovascular - Regular rhythm and rate.  Mental  Status -  Mental status: Lethargic.  Arousable to voice, follows commands, easily redirectable.  Cognitive impairment, baseline. HEENT: Bandage placed on the right side of his head  Cranial Nerves II - XII - II - Visual field intact OU. III, IV, VI - Extraocular movements intact. V - Facial sensation intact bilaterally. VII - Facial movement intact bilaterally. VIII - Hearing & vestibular intact bilaterally. X - Palate elevates symmetrically. XI - Chin turning & shoulder shrug intact bilaterally. XII - Tongue protrusion intact.  Motor Strength - The patient's strength was normal in all extremities and pronator drift was absent.  Bulk was normal and fasciculations were absent.   Motor Tone - Muscle tone was assessed at the neck and appendages and was normal.  Sensory - Light touch assessed and symmetrical.    Coordination - The patient had normal movements in the hands and feet with no ataxia or dysmetria.  Tremor was absent.  Gait and Station - deferred.  ASSESSMENT/PLAN Mr. Timothy Arias is a 87 y.o. male with history of ischemic heart disease, HTN, acute lower limb ischemia  s/p stenting who initially presented as a level 2 trauma. He was walking to the mailbox when he fell. Housekeeper saw him on the ground outside and yelled for patients wife to call EMS. When she got outside he was laying on the ground with blood coming from his head. He remembers the entire event, states his legs came out from underneath him. Does not remember tripping over anything, but does not know exactly why he fell. His wife tells me that she has noticed him shuffling when he walks recently.   Stroke: Acute right traumatic   acute right subdural hematoma.  Right cerebellar subcortical hemorrhage etiology indeterminate Etiology: Likely due to trauma CT head right cerebellar ICH, acute right subdural hematoma Repeat CT head stable CTA head & neck: Negative CTA for large vessel occlusion or other emergent  finding. No vascular malformation seen underlying the right cerebral hemorrhage. Repeat CT with/without: ordered for tomorrow AM.  MRI ordered, unable to get due to metal in orbit.  Neurosurgery consulted no interventions at this time 2D Echo LVEF 55 to 60%.  Moderate left ventricular hypertrophy LDL 143 HgbA1c 5.4 VTE prophylaxis -SCDs    Diet   Diet heart healthy/carb modified Room service appropriate? Yes; Fluid consistency: Thin   Aspirin 325 mg and Plavix 75 mg prior to admission, now on no antithrombotics.  Will consider restarting in about 5-7 days after repeat CT head is stable Therapy recommendations: Pending Disposition: Pending  Hypertension CAD Home meds: Norvasc 5 mg, metoprolol 50 mg Stable BP goal 130-150 As needed labetalol and hydralazine to maintain blood pressure Will restart home amlodipine Long-term BP goal normotensive  Hyperlipidemia Home meds: Atorvastatin 80 mg, not resumed in hospital LDL 143, goal < 70 Consider switching to Crestor Consider restarting on discharge.  Dementia Restart home Namenda and galantamine  Other Stroke Risk Factors Advanced Age >/= 46  Former cigarette smoker Coronary artery disease  Other Active Problems BPH Anemia-Hgb 12.7 will monitor Low potassium replaced today  Hospital day # 4   Patient has baseline cognitive impairment and remains intermittently agitated and confused but otherwise has no focal deficits.  Blood pressure adequately controlled.  MRI to be done today as wife confirms there is no medical in his eyes.   Mobilize out of bed.  Continue ongoing therapy consults.   Continue conservative management for his acute subdural hematoma.  Appreciate neurosurgery consult.  Long discussion with wife at the bedside and answered questions.  Greater than 50% time during this 35-minute visit was spent in counseling and coordination of care about his subdural hematoma and cerebellar hemorrhage and answering questions.      Delia Heady, MD Medical Director [facility] St[facility] Center Pager: [phone] 09/12/2022 2:30 PM    To contact Stroke Continuity provider, please refer to [email or web address]. After hours, contact General Neurology

## 2022-09-12 NOTE — Progress Notes (Signed)
Speech Language Pathology Treatment: Cognitive-Linquistic  Patient Details Name: Timothy Arias MRN: [id number] DOB: 16-Oct-1935 Today's Date: 09/12/2022 Time: 4098-1191 SLP Time Calculation (min) (ACUTE ONLY): 20 min  Assessment / Plan / Recommendation Clinical Impression  Patient seen by SLP for skilled treatment focused on cognitive function goals. Patient was awake and alert, lying in bed and his spouse was in the room as well. She inquired about MRI being done or not and reporting that they had cancelled it earlier because he may have metal in his head but she denied this. SLP did see that MRI had been completed and deferred to MD to discuss results. Spouse also asking about date of discharge. She reported that at home, patient has not been eating as much and he has continued that here in the hospital. She states that he seems to be at baseline for his cognitive function and that she is still able to take care of him at home. SLP to s/o at this time.     HPI HPI: Patient is an 87 y.o. male with PMH: ischemic heart disease, HTN, dementia, acute lower limb ischemia s/p stenting. He presented to the hospital on 09/08/22 as a level 2 trauma after falling outside while walking to mailbox (witnessed by housekeeper). During hospital admission, patient's spouse reported that she has noticed some shuffling gait when he walks recently and he has not been eating or drinking as much but has been taking all of his prescribed medications.      SLP Plan  All goals met;Discharge SLP treatment due to (comment)      Recommendations for follow up therapy are one component of a multi-disciplinary discharge planning process, led by the attending physician.  Recommendations may be updated based on patient status, additional functional criteria and insurance authorization.    Recommendations                         Frequent or constant Supervision/Assistance Cognitive communication deficit (R41.841)      All goals met;Discharge SLP treatment due to (comment)    Angela Nevin, MA, CCC-SLP Speech Therapy

## 2022-09-12 NOTE — Progress Notes (Signed)
Physical Therapy Treatment Patient Details Name: Timothy Arias MRN: [id number] DOB: 04/21/1936 Today's Date: 09/12/2022   History of Present Illness 87 y.o. male presents to [facility] hospital on 09/08/2022 after a fall, found to have R SDH along with R cerebellar IPH. PMH: BPH, CAD, ED, HLD, HTN, MI, hyperlipidemia, LE ischemia on anticoagulant.    PT Comments    Pt overall calm and cooperative during session and following all commands. Ambulating 150 ft with a walker and negotiated stairs at a min guard assist level. Displays shuffling like gait with bilateral foot clearance, which is close to baseline. Continues to benefit from RW for increased external support. Will benefit from HHPT at d/c.    Recommendations for follow up therapy are one component of a multi-disciplinary discharge planning process, led by the attending physician.  Recommendations may be updated based on patient status, additional functional criteria and insurance authorization.  Follow Up Recommendations       Assistance Recommended at Discharge Set up Supervision/Assistance  Patient can return home with the following A little help with bathing/dressing/bathroom;Assistance with cooking/housework;Assist for transportation;Help with stairs or ramp for entrance   Equipment Recommendations  None recommended by PT    Recommendations for Other Services       Precautions / Restrictions Precautions Precautions: Fall Restrictions Weight Bearing Restrictions: No     Mobility  Bed Mobility Overal bed mobility: Needs Assistance Bed Mobility: Supine to Sit     Supine to sit: Supervision     General bed mobility comments: Increased time, no physical assist required    Transfers Overall transfer level: Needs assistance Equipment used: Rolling walker (2 wheels) Transfers: Sit to/from Stand Sit to Stand: Min guard                Ambulation/Gait Ambulation/Gait assistance: Min guard Gait Distance (Feet): 150  Feet Assistive device: Rolling walker (2 wheels) Gait Pattern/deviations: Step-through pattern, Decreased stride length Gait velocity: decreased     General Gait Details: Decreased bilateral foot clearance, cues for keeping feet on inside of walker during turns, min guard for safety   Stairs Stairs: Yes Stairs assistance: Min guard Stair Management: Two rails Number of Stairs: 5 General stair comments: step by step, cautious   Wheelchair Mobility    Modified Rankin (Stroke Patients Only) Modified Rankin (Stroke Patients Only) Pre-Morbid Rankin Score: Moderate disability Modified Rankin: Moderately severe disability     Balance Overall balance assessment: Needs assistance Sitting-balance support: Feet supported Sitting balance-Leahy Scale: Good     Standing balance support: Reliant on assistive device for balance Standing balance-Leahy Scale: Poor                              Cognition Arousal/Alertness: Awake/alert Behavior During Therapy: WFL for tasks assessed/performed Overall Cognitive Status: Impaired/Different from baseline Area of Impairment: Safety/judgement, Awareness                         Safety/Judgement: Decreased awareness of deficits, Decreased awareness of safety Awareness: Emergent   General Comments: h/o dementia at baseline, but functional, driving with wife present        Exercises      General Comments        Pertinent Vitals/Pain Pain Assessment Pain Assessment: No/denies pain    Home Living  Prior Function            PT Goals (current goals can now be found in the care plan section) Acute Rehab PT Goals Patient Stated Goal: to return to ambulating with SPC Potential to Achieve Goals: Good Progress towards PT goals: Progressing toward goals    Frequency    Min 4X/week      PT Plan Current plan remains appropriate    Co-evaluation               AM-PAC PT "6 Clicks" Mobility   Outcome Measure  Help needed turning from your back to your side while in a flat bed without using bedrails?: A Little Help needed moving from lying on your back to sitting on the side of a flat bed without using bedrails?: A Little Help needed moving to and from a bed to a chair (including a wheelchair)?: A Little Help needed standing up from a chair using your arms (e.g., wheelchair or bedside chair)?: A Little Help needed to walk in hospital room?: A Little Help needed climbing 3-5 steps with a railing? : A Little 6 Click Score: 18    End of Session Equipment Utilized During Treatment: Gait belt Activity Tolerance: Patient tolerated treatment well Patient left: in chair;with chair alarm set;with restraints reapplied Nurse Communication: Mobility status PT Visit Diagnosis: Other abnormalities of gait and mobility (R26.89);Muscle weakness (generalized) (M62.81)     Time: 1610-9604 PT Time Calculation (min) (ACUTE ONLY): 31 min  Charges:  $Therapeutic Activity: 23-37 mins                     Lillia Pauls, PT, DPT Acute Rehabilitation Services Office [phone]    Timothy Arias 09/12/2022, 8:52 AM

## 2022-09-12 NOTE — Progress Notes (Signed)
Patient is confused and agitated. He stated that he was in a garage and not at the hospital. He became combative and asked me to call his wife. I dialed the phone and he talked to his wife, Aurea Graff.

## 2022-09-12 NOTE — Care Management Important Message (Signed)
Important Message  Patient Details  Name: Timothy Arias MRN: [id number] Date of Birth: 1936/03/01   Medicare Important Message Given:  Yes     Yuniel Blaney Stefan Church 09/12/2022, 2:44 PM

## 2022-09-13 DIAGNOSIS — I614 Nontraumatic intracerebral hemorrhage in cerebellum: Secondary | ICD-10-CM | POA: Diagnosis not present

## 2022-09-13 NOTE — Discharge Summary (Addendum)
Stroke Discharge Summary  Patient ID: Timothy Arias   MRN: [id number]      DOB: 1935/12/30  Date of Admission: 09/08/2022 Date of Discharge: 09/13/2022  Attending Physician:  Stroke, Md, MD, Stroke MD Consultant(s):    Neurosurgery Patient's PCP:  Renford Dills, MD  DISCHARGE DIAGNOSIS:  Principal Problem:   ICH (intracerebral hemorrhage) (HCC) Acute subdural hematoma posttraumatic with right cerebellar peduncle hemorrhage of indeterminate etiology Hyperlipidemia Baseline dementia Hypertension Allergies as of 09/13/2022   No Known Allergies      Medication List     STOP taking these medications    aspirin 325 MG tablet   clopidogrel 75 MG tablet Commonly known as: PLAVIX       TAKE these medications    amLODipine 5 MG tablet Commonly known as: NORVASC Take 5 mg by mouth daily.   atorvastatin 80 MG tablet Commonly known as: Lipitor Take 1 tablet (80 mg total) by mouth daily.   citalopram 20 MG tablet Commonly known as: CELEXA Take 20 mg by mouth daily.   fluocinonide 0.05 % external solution Commonly known as: LIDEX Apply 1 application. topically daily as needed (eczema).   fluticasone 50 MCG/ACT nasal spray Commonly known as: FLONASE Place 1 spray into both nostrils daily as needed for congestion.   galantamine 8 MG 24 hr capsule Commonly known as: RAZADYNE ER Take 2 capsules (16 mg total) by mouth at bedtime. What changed: when to take this   memantine 10 MG tablet Commonly known as: Namenda Take 1 tablet (10 mg total) by mouth 2 (two) times daily.   metoprolol succinate 50 MG 24 hr tablet Commonly known as: TOPROL-XL Take 50 mg by mouth daily.   multivitamin with minerals Tabs tablet Take 1 tablet by mouth at bedtime.   Vitamin D3 50 MCG (2000 UT) capsule Take 2,000 Units by mouth at bedtime.      LABORATORY STUDIES CBC    Component Value Date/Time   WBC 7.5 09/10/2022 0658   RBC 4.42 09/10/2022 0658   HGB 13.3 09/10/2022 0658    HCT 39.1 09/10/2022 0658   PLT 155 09/10/2022 0658   MCV 88.5 09/10/2022 0658   MCH 30.1 09/10/2022 0658   MCHC 34.0 09/10/2022 0658   RDW 13.7 09/10/2022 0658   LYMPHSABS 2.7 06/26/2022 0135   MONOABS 0.8 06/26/2022 0135   EOSABS 0.0 06/26/2022 0135   BASOSABS 0.0 06/26/2022 0135   CMP    Component Value Date/Time   NA 138 09/10/2022 0658   K 3.4 (L) 09/10/2022 0658   CL 105 09/10/2022 0658   CO2 22 09/10/2022 0658   GLUCOSE 136 (H) 09/10/2022 0658   BUN 8 09/10/2022 0658   CREATININE 0.88 09/10/2022 0658   CALCIUM 9.5 09/10/2022 0658   PROT 6.7 09/08/2022 1439   ALBUMIN 3.6 09/08/2022 1439   AST 27 09/08/2022 1439   ALT 19 09/08/2022 1439   ALKPHOS 102 09/08/2022 1439   BILITOT 0.7 09/08/2022 1439   GFRNONAA >60 09/10/2022 0658   GFRAA >60 06/09/2019 1705   COAGS Lab Results  Component Value Date   INR 1.0 06/26/2022   Lipid Panel    Component Value Date/Time   CHOL 208 (H) 06/28/2022 0931   TRIG 130 06/28/2022 0931   HDL 39 (L) 06/28/2022 0931   CHOLHDL 5.3 06/28/2022 0931   VLDL 26 06/28/2022 0931   LDLCALC 143 (H) 06/28/2022 0931   HgbA1C  Lab Results  Component Value Date   HGBA1C  5.4 09/10/2022   Urinalysis    Component Value Date/Time   COLORURINE YELLOW 09/08/2022 2106   APPEARANCEUR CLEAR 09/08/2022 2106   LABSPEC 1.019 09/08/2022 2106   PHURINE 6.0 09/08/2022 2106   GLUCOSEU NEGATIVE 09/08/2022 2106   HGBUR NEGATIVE 09/08/2022 2106   BILIRUBINUR NEGATIVE 09/08/2022 2106   KETONESUR 5 (A) 09/08/2022 2106   PROTEINUR NEGATIVE 09/08/2022 2106   UROBILINOGEN 1.0 01/13/2012 0205   NITRITE NEGATIVE 09/08/2022 2106   LEUKOCYTESUR NEGATIVE 09/08/2022 2106   Urine Drug Screen No results found for: "LABOPIA", "COCAINSCRNUR", "LABBENZ", "AMPHETMU", "THCU", "LABBARB"  Alcohol Level    Component Value Date/Time   ETH <10 09/08/2022 1439   SIGNIFICANT DIAGNOSTIC STUDIES MR BRAIN WO CONTRAST  Result Date: 09/12/2022 CLINICAL DATA:  Stroke,  follow-up. EXAM: MRI HEAD WITHOUT CONTRAST TECHNIQUE: Multiplanar, multiecho pulse sequences of the brain and surrounding structures were obtained without intravenous contrast. COMPARISON:  Head CT Sep 09, 2022. FINDINGS: Brain: Faint residual right-sided subdural hematoma measuring up to 4 mm. No significant mass effect. 12 mm right middle cerebellar peduncle hematoma with surrounding vasogenic edema appear unchanged compared to prior CT. Paramedian foci of susceptibility artifact in the right frontal lobe in a pattern suggestive of prior traumatic injury. A single focus of susceptibility artifact is also seen in the left frontal lobe. Scattered foci of T2 hyperintensity are seen within the white matter of the cerebral hemispheres, nonspecific, most likely related to chronic microangiopathy. Mild parenchymal volume loss. No acute infarct, hydrocephalus or mass lesion. Vascular: Normal flow voids. Skull and upper cervical spine: Normal marrow signal. Sinuses/Orbits: Hypoplastic maxillary sinuses with opacification on the right side. Trace mucosal thickening elsewhere in the paranasal sinuses. The orbits are maintained. Other: None. IMPRESSION: 1. Stable right-sided subdural hematoma measuring up to 4 mm. No significant mass effect. 2. Unchanged right middle cerebellar peduncle hematoma with surrounding vasogenic edema. 3. Foci of susceptibility artifact in the right frontal lobe in a pattern suggestive of prior traumatic injury. 4. Mild chronic microvascular ischemic changes of the white matter. Electronically Signed   By: Baldemar Lenis M.D.   On: 09/12/2022 14:38   ECHOCARDIOGRAM COMPLETE  Result Date: 09/10/2022    ECHOCARDIOGRAM REPORT   Patient Name:   Timothy Arias Date of Exam: 09/10/2022 Medical Rec #:  [id number]      Height:       72.0 in Accession #:    [id number]     Weight:       204.4 lb Date of Birth:  08-Mar-1936      BSA:          2.150 m Patient Age:    87 years       BP:            133/83 mmHg Patient Gender: M              HR:           75 bpm. Exam Location:  Inpatient Procedure: 2D Echo, Cardiac Doppler and Color Doppler Indications:    stroke  History:        Patient has no prior history of Echocardiogram examinations.                 CAD; Risk Factors:Hypertension and Dyslipidemia.  Sonographer:    Delcie Roch RDCS Referring Phys: [id number] [facility]  Sonographer Comments: Image acquisition challenging due to uncooperative patient. IMPRESSIONS  1. Left ventricular ejection fraction, by estimation, is 55 to 60%. The left  ventricle has normal function. The left ventricle has no regional wall motion abnormalities. There is moderate left ventricular hypertrophy. Left ventricular diastolic parameters were normal.  2. Right ventricular systolic function is normal. The right ventricular size is normal. There is normal pulmonary artery systolic pressure. The estimated right ventricular systolic pressure is 31.7 mmHg.  3. The mitral valve is normal in structure. No evidence of mitral valve regurgitation. No evidence of mitral stenosis.  4. The aortic valve is tricuspid. Aortic valve regurgitation is not visualized. Aortic valve sclerosis/calcification is present, without any evidence of aortic stenosis.  5. The inferior vena cava is normal in size with greater than 50% respiratory variability, suggesting right atrial pressure of 3 mmHg. FINDINGS  Left Ventricle: Left ventricular ejection fraction, by estimation, is 55 to 60%. The left ventricle has normal function. The left ventricle has no regional wall motion abnormalities. The left ventricular internal cavity size was normal in size. There is  moderate left ventricular hypertrophy. Left ventricular diastolic parameters were normal. Right Ventricle: The right ventricular size is normal. No increase in right ventricular wall thickness. Right ventricular systolic function is normal. There is normal pulmonary artery systolic pressure. The  tricuspid regurgitant velocity is 2.68 m/s, and  with an assumed right atrial pressure of 3 mmHg, the estimated right ventricular systolic pressure is 31.7 mmHg. Left Atrium: Left atrial size was normal in size. Right Atrium: Right atrial size was normal in size. Pericardium: There is no evidence of pericardial effusion. Mitral Valve: The mitral valve is normal in structure. No evidence of mitral valve regurgitation. No evidence of mitral valve stenosis. Tricuspid Valve: The tricuspid valve is normal in structure. Tricuspid valve regurgitation is trivial. Aortic Valve: The aortic valve is tricuspid. Aortic valve regurgitation is not visualized. Aortic valve sclerosis/calcification is present, without any evidence of aortic stenosis. Aortic valve mean gradient measures 6.5 mmHg. Aortic valve peak gradient measures 12.2 mmHg. Aortic valve area, by VTI measures 2.07 cm. Pulmonic Valve: The pulmonic valve was not well visualized. Pulmonic valve regurgitation is not visualized. Aorta: The aortic root and ascending aorta are structurally normal, with no evidence of dilitation. Venous: The inferior vena cava is normal in size with greater than 50% respiratory variability, suggesting right atrial pressure of 3 mmHg. IAS/Shunts: The interatrial septum was not well visualized.  LEFT VENTRICLE PLAX 2D LVIDd:         4.70 cm   Diastology LVIDs:         3.30 cm   LV e' medial:    7.29 cm/s LV PW:         1.30 cm   LV E/e' medial:  9.2 LV IVS:        1.50 cm   LV e' lateral:   9.36 cm/s LVOT diam:     2.20 cm   LV E/e' lateral: 7.1 LV SV:         73 LV SV Index:   34 LVOT Area:     3.80 cm  RIGHT VENTRICLE             IVC RV S prime:     14.30 cm/s  IVC diam: 1.80 cm TAPSE (M-mode): 2.4 cm LEFT ATRIUM             Index        RIGHT ATRIUM           Index LA diam:        3.90 cm 1.81 cm/m   RA  Area:     12.70 cm LA Vol (A2C):   62.3 ml 28.97 ml/m  RA Volume:   28.70 ml  13.35 ml/m LA Vol (A4C):   55.5 ml 25.81 ml/m LA  Biplane Vol: 63.2 ml 29.39 ml/m  AORTIC VALVE AV Area (Vmax):    1.78 cm AV Area (Vmean):   1.79 cm AV Area (VTI):     2.07 cm AV Vmax:           174.50 cm/s AV Vmean:          117.000 cm/s AV VTI:            0.351 m AV Peak Grad:      12.2 mmHg AV Mean Grad:      6.5 mmHg LVOT Vmax:         81.75 cm/s LVOT Vmean:        55.000 cm/s LVOT VTI:          0.191 m LVOT/AV VTI ratio: 0.54  AORTA Ao Root diam: 3.00 cm Ao Asc diam:  3.70 cm MITRAL VALVE               TRICUSPID VALVE MV Area (PHT): 4.49 cm    TR Peak grad:   28.7 mmHg MV Decel Time: 169 msec    TR Vmax:        268.00 cm/s MV E velocity: 66.90 cm/s MV A velocity: 96.80 cm/s  SHUNTS MV E/A ratio:  0.69        Systemic VTI:  0.19 m                            Systemic Diam: 2.20 cm Epifanio Lesches MD Electronically signed by Epifanio Lesches MD Signature Date/Time: 09/10/2022/11:48:18 AM    Final    CT ANGIO HEAD NECK W WO CM  Result Date: 09/10/2022 CLINICAL DATA:  Follow-up for hemorrhagic stroke. EXAM: CT ANGIOGRAPHY HEAD AND NECK WITH AND WITHOUT CONTRAST TECHNIQUE: Multidetector CT imaging of the head and neck was performed using the standard protocol during bolus administration of intravenous contrast. Multiplanar CT image reconstructions and MIPs were obtained to evaluate the vascular anatomy. Carotid stenosis measurements (when applicable) are obtained utilizing NASCET criteria, using the distal internal carotid diameter as the denominator. RADIATION DOSE REDUCTION: This exam was performed according to the departmental dose-optimization program which includes automated exposure control, adjustment of the mA and/or kV according to patient size and/or use of iterative reconstruction technique. CONTRAST:  75mL OMNIPAQUE IOHEXOL 350 MG/ML SOLN COMPARISON:  Prior studies from earlier the same day. FINDINGS: CTA NECK FINDINGS Aortic arch: Visualized arch within normal limits for caliber with standard branch pattern. Moderate atheromatous  change about the arch itself. No stenosis about the origin the great vessels. Right carotid system: Right common and internal carotid arteries are patent without dissection. Moderate atheromatous change about the right carotid bulb/proximal right ICA without hemodynamically significant greater than 50% stenosis. Left carotid system: Left common and internal carotid arteries are patent without dissection. Moderate atheromatous change about the left carotid bulb with associated stenosis of up to 60% by NASCET criteria. Probable small superimposed penetrating plaque noted at this level (series 7, images 199, 198). Vertebral arteries: Both vertebral arteries arise from subclavian arteries. No proximal subclavian artery stenosis. Severe stenosis present at the origin of the left vertebral artery. Vertebral arteries are otherwise patent distally without stenosis or dissection. Skeleton: No discrete or worrisome osseous lesions. 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 Scattered OPLL noted at C2-3 through C6-7. Other neck: No other acute abnormality within the neck. Upper chest: Visualized upper chest demonstrates no acute finding. Review of the MIP images confirms the above findings CTA HEAD FINDINGS Anterior circulation: Atheromatous change within the carotid siphons without hemodynamically significant stenosis. A1 segments patent bilaterally. Right A1 hypoplastic. Normal anterior communicating complex. Anterior cerebral arteries patent without stenosis. No M1 stenosis or occlusion. No proximal MCA branch occlusion or high-grade stenosis. Distal MCA branches perfused and symmetric. Posterior circulation: Both V4 segments patent without significant stenosis. Both PICA patent. Basilar patent without stenosis. Superior cerebral arteries patent bilaterally. Left PCA supplied via the basilar. Fetal type origin of the right PCA. Mild atheromatous irregularity about the PCAs without hemodynamically significant  stenosis. Venous sinuses: Patent allowing for timing the contrast bolus. Anatomic variants: As above. No intracranial aneurysm. No vascular malformation seen underlying the right cerebral hemorrhage. Review of the MIP images confirms the above findings IMPRESSION: 1. Negative CTA for large vessel occlusion or other emergent finding. No vascular malformation seen underlying the right cerebral hemorrhage. 2. Moderate atheromatous change about the left carotid bulb with associated stenosis of up to 60% by NASCET criteria. Probable small superimposed penetrating plaque as above. 3. 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 Aortic Atherosclerosis (ICD10-I70.0). Electronically Signed   By: Rise Mu M.D.   On: 09/10/2022 01:22   CT HEAD WO CONTRAST ( )  Result Date: 09/09/2022 CLINICAL DATA:  Follow-up examination for intracranial hemorrhage. EXAM: CT HEAD WITHOUT CONTRAST TECHNIQUE: Contiguous axial images were obtained from the base of the skull through the vertex without intravenous contrast. RADIATION DOSE REDUCTION: This exam was performed according to the departmental dose-optimization program which includes automated exposure control, adjustment of the mA and/or kV according to patient size and/or use of iterative reconstruction technique. COMPARISON:  CT from earlier the same day. FINDINGS: Brain: Previously identified intraparenchymal hemorrhage involving the right cerebellum again seen slightly increased in size now measuring up to 12 mm. Minimal localized edema without significant regional mass effect. Subdural hematoma overlying the posterior right cerebral convexity is little interval changed measuring up to 4 mm in maximal thickness. No significant mass effect or midline shift. No new intracranial hemorrhage. No other acute large vessel territory infarct. No mass lesion or midline shift. No hydrocephalus. Vascular: No abnormal hyperdense vessel. Scattered calcified  atherosclerosis present at the skull base. Skull: Evolving right frontal/periorbital soft tissue contusion. Few small superimposed radiopaque foreign bodies again noted. Calvarium intact. Sinuses/Orbits: Globes and orbital soft tissues demonstrate no other acute finding. Ocular senescent calcifications noted. Hypoplastic right maxillary sinus noted. Scattered mucoperiosteal thickening present elsewhere throughout the paranasal sinuses. No mastoid effusion. Other: None. IMPRESSION: 1. Slight interval increase in size of 12 mm right cerebellar hemorrhage. Minimal localized edema without significant regional mass effect. 2. Unchanged subdural hematoma overlying the posterior right cerebral convexity measuring up to 4 mm in maximal thickness. No significant mass effect or midline shift. 3. No other new acute intracranial abnormality. 4. Evolving right frontal/periorbital soft tissue contusion. Electronically Signed   By: Rise Mu M.D.   On: 09/09/2022 20:12   CT HEAD WO CONTRAST ( )  Result Date: 09/09/2022 CLINICAL DATA:  87 year old male status post fall with right side subdural hematoma. EXAM: CT HEAD WITHOUT CONTRAST TECHNIQUE: Contiguous axial images were obtained from the base of the skull through the vertex without intravenous contrast. RADIATION DOSE REDUCTION: This exam was performed according  to the departmental dose-optimization program which includes automated exposure control, adjustment of the mA and/or kV according to patient size and/or use of iterative reconstruction technique. COMPARISON:  Head CT yesterday. Head CT 08/16/2010. FINDINGS: Brain: Right side subdural hematoma has read distributed more posteriorly now. Thickness is up to 4 mm, which is decreased from 6 mm yesterday. No midline shift. Normal basilar cisterns. No significant ventricle mass effect. No ventriculomegaly. No IVH. No subarachnoid blood. However, there is evidence of a small intra-axial hemorrhage in the right  deep cerebellar nuclei and marginally involving the peduncle as seen yesterday. This is up to 12 mm long axis and stable to slightly smaller. No significant regional edema or mass effect. No other intra-axial blood. Stable gray-white matter differentiation throughout the brain. No cortically based acute infarct identified. Vascular: Calcified atherosclerosis at the skull base. No suspicious intracranial vascular hyperdensity. Skull: No skull fracture identified. Sinuses/Orbits: Bilateral paranasal sinus mucosal thickening is mild-to-moderate and stable. Tympanic cavities and mastoids remain clear. Other: Broad-based right lateral face and scalp soft tissue hematoma, possibly with suspicion of punctate retained foreign bodies just lateral to the right orbit. See series 4 images 39 and 40. Postoperative changes to both globes. IMPRESSION: 1. Right side subdural hematoma has mildly regressed and redistributed more posteriorly. Now up to 4 mm in thickness. 2. Small focus of intra-axial hemorrhage at the right deep cerebellum, peduncle also appears slightly smaller. No significant edema or mass effect. Presumably this is also posttraumatic (possibly shear hemorrhage). 3. No significant intracranial mass effect. No new intracranial abnormality. 4. No skull fracture identified but right lateral face and scalp hematoma, with suspicion of punctate retained foreign bodies just lateral to the right orbit. Electronically Signed   By: Odessa Fleming M.D.   On: 09/09/2022 06:18   CT HEAD WO CONTRAST  Result Date: 09/08/2022 CLINICAL DATA:  Head trauma, status post fall EXAM: CT HEAD WITHOUT CONTRAST CT CERVICAL SPINE WITHOUT CONTRAST TECHNIQUE: Multidetector CT imaging of the head and cervical spine was performed following the standard protocol without intravenous contrast. Multiplanar CT image reconstructions of the cervical spine were also generated. RADIATION DOSE REDUCTION: This exam was performed according to the departmental  dose-optimization program which includes automated exposure control, adjustment of the mA and/or kV according to patient size and/or use of iterative reconstruction technique. COMPARISON:  08/16/2010 FINDINGS: CT HEAD FINDINGS Brain: No evidence of acute infarction, ventriculomegaly, or mass effect. Acute right subdural hemorrhage along the right cerebral convexity measuring 5.8 mm in maximum thickness. 7.8 x 6.1 x 10.9 mm intraparenchymal hemorrhage in the right middle cerebellar peduncle. No midline shift. Generalized cerebral atrophy. Periventricular white matter low attenuation likely secondary to microangiopathy. Vascular: Cerebrovascular atherosclerotic calcifications are noted. No hyperdense vessels. Skull: Negative for fracture or focal lesion. Sinuses/Orbits: Visualized portions of the orbits are unremarkable. Visualized portions of the paranasal sinuses are unremarkable. Visualized portions of the mastoid air cells are unremarkable. Other: Right frontal scalp hematoma. CT CERVICAL SPINE FINDINGS Alignment: Normal. Skull base and vertebrae: No acute fracture. No primary bone lesion or focal pathologic process. Soft tissues and spinal canal: No prevertebral fluid or swelling. No visible canal hematoma. Disc levels: Anterior bridging osteophytes throughout the cervicothoracic spine. 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 Mild bilateral foraminal stenosis at C3-4 and C4-5. Bilateral foraminal stenosis at C6-7. Bilateral facet arthropathy throughout the cervical spine. Upper chest: Lung apices are clear. Thoracic aortic atherosclerosis. Other: No fluid collection or  hematoma. Carotid artery atherosclerosis. IMPRESSION: 1. Acute right subdural hemorrhage along the right cerebral convexity measuring 5.8 mm in maximum thickness. A 7.8 x 6.1 x 10.9 mm intraparenchymal hemorrhage in the right middle cerebellar peduncle. 2. No acute osseous  injury of the cervical spine. 3. Cervical spine spondylosis as described above. Critical Value/emergent results were called by telephone at the time of interpretation on 09/08/2022 at 3:20 pm to provider ADAM CURATOLO , who verbally acknowledged these results. Electronically Signed   By: Elige Ko M.D.   On: 09/08/2022 15:21   CT CERVICAL SPINE WO CONTRAST  Result Date: 09/08/2022 CLINICAL DATA:  Head trauma, status post fall EXAM: CT HEAD WITHOUT CONTRAST CT CERVICAL SPINE WITHOUT CONTRAST TECHNIQUE: Multidetector CT imaging of the head and cervical spine was performed following the standard protocol without intravenous contrast. Multiplanar CT image reconstructions of the cervical spine were also generated. RADIATION DOSE REDUCTION: This exam was performed according to the departmental dose-optimization program which includes automated exposure control, adjustment of the mA and/or kV according to patient size and/or use of iterative reconstruction technique. COMPARISON:  08/16/2010 FINDINGS: CT HEAD FINDINGS Brain: No evidence of acute infarction, ventriculomegaly, or mass effect. Acute right subdural hemorrhage along the right cerebral convexity measuring 5.8 mm in maximum thickness. 7.8 x 6.1 x 10.9 mm intraparenchymal hemorrhage in the right middle cerebellar peduncle. No midline shift. Generalized cerebral atrophy. Periventricular white matter low attenuation likely secondary to microangiopathy. Vascular: Cerebrovascular atherosclerotic calcifications are noted. No hyperdense vessels. Skull: Negative for fracture or focal lesion. Sinuses/Orbits: Visualized portions of the orbits are unremarkable. Visualized portions of the paranasal sinuses are unremarkable. Visualized portions of the mastoid air cells are unremarkable. Other: Right frontal scalp hematoma. CT CERVICAL SPINE FINDINGS Alignment: Normal. Skull base and vertebrae: No acute fracture. No primary bone lesion or focal pathologic process. Soft  tissues and spinal canal: No prevertebral fluid or swelling. No visible canal hematoma. Disc levels: Anterior bridging osteophytes throughout the cervicothoracic spine. 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 Mild bilateral foraminal stenosis at C3-4 and C4-5. Bilateral foraminal stenosis at C6-7. Bilateral facet arthropathy throughout the cervical spine. Upper chest: Lung apices are clear. Thoracic aortic atherosclerosis. Other: No fluid collection or hematoma. Carotid artery atherosclerosis. IMPRESSION: 1. Acute right subdural hemorrhage along the right cerebral convexity measuring 5.8 mm in maximum thickness. A 7.8 x 6.1 x 10.9 mm intraparenchymal hemorrhage in the right middle cerebellar peduncle. 2. No acute osseous injury of the cervical spine. 3. Cervical spine spondylosis as described above. Critical Value/emergent results were called by telephone at the time of interpretation on 09/08/2022 at 3:20 pm to provider ADAM CURATOLO , who verbally acknowledged these results. Electronically Signed   By: Elige Ko M.D.   On: 09/08/2022 15:21   DG Knee Right Port  Result Date: 09/08/2022 CLINICAL DATA:  Status post fall. EXAM: PORTABLE RIGHT KNEE - 1-2 VIEW COMPARISON:  None Available. FINDINGS: The mineralization and alignment are normal. There is no evidence of acute fracture or dislocation. Minimal lateral and patellofemoral compartment degenerative changes with patellar spurring. Possible small knee joint effusion. No acute soft tissue abnormalities are identified. Femoral atherosclerosis noted. IMPRESSION: No evidence of acute fracture or dislocation. Possible small knee joint effusion. Electronically Signed   By: Carey Bullocks M.D.   On: 09/08/2022 15:12   DG Wrist Complete Right  Result Date: 09/08/2022 CLINICAL DATA:  Status post fall. EXAM: RIGHT WRIST - COMPLETE 3+ VIEW  COMPARISON:  None Available. FINDINGS: The  mineralization and alignment are normal. There is no evidence of acute fracture or dislocation. Mild degenerative changes the radial aspect of the wrist with scattered vascular calcifications. No evidence of foreign body. IMPRESSION: No evidence of acute fracture or dislocation. Mild degenerative changes. Electronically Signed   By: Carey Bullocks M.D.   On: 09/08/2022 15:10   DG Chest Port 1 View  Result Date: 09/08/2022 CLINICAL DATA:  Fall EXAM: PORTABLE CHEST 1 VIEW COMPARISON:  01/18/2022 FINDINGS: The heart size and mediastinal contours are within normal limits. Aortic atherosclerosis. No focal airspace consolidation, pleural effusion, or pneumothorax. No acute bony findings. IMPRESSION: No active disease. Electronically Signed   By: Duanne Guess D.O.   On: 09/08/2022 15:10   DG Pelvis Portable  Result Date: 09/08/2022 CLINICAL DATA:  Trauma patient.  Patient fell. EXAM: PORTABLE PELVIS 1-2 VIEWS COMPARISON:  Abdominopelvic CT 01/18/2022. FINDINGS: Two supine views are submitted. There is no evidence of acute fracture or dislocation. Moderate degenerative changes are present in both hips. The sacroiliac joints and symphysis pubis are intact. Ankylosis of the lower lumbar spine and a right iliac vascular stent are noted. IMPRESSION: No evidence of acute pelvic fracture. Moderate degenerative changes in both hips. Electronically Signed   By: Carey Bullocks M.D.   On: 09/08/2022 15:09      HISTORY OF PRESENT ILLNESS Timothy Arias is a 87 y.o. male with a past medical history of ischemic heart disease, HTN, acute lower limb ischemia s/p stenting who initially presented as a level 2 trauma. He was walking to the mailbox when he fell on 09/08/2022. Housekeeper saw him on the ground outside and yelled for patient's wife to call EMS. When she got outside he was laying on the ground with blood coming from his head. He remembers the entire event, states his legs came out from underneath him. Does not  remember tripping over anything, but does not know exactly why he fell. His wife tells me that she has noticed him shuffling when he walks recently. He also has not been eating or drinking as much. She does have to tell him the date every morning. She has attributed this to his dementia. He has been taking all of his medications as prescribed. Initial meeting in the ED found patient with an acute right subdural hemorrhage along the right cerebral convexity and a right cerebellar ICH.  HOSPITAL COURSE Stroke: Acute traumatic right subdural hematoma.  Right cerebellar subcortical hemorrhage  Etiology: Likely due to trauma CT head right cerebellar ICH, acute right subdural hematoma Repeat CT head stable CTA head & neck: Negative CTA for large vessel occlusion or other emergent finding. No vascular malformation seen underlying the right cerebral hemorrhage. Repeat CT with/without: ordered for tomorrow AM.  MRI ordered, unable to get due to metal in orbit.  Neurosurgery consulted no interventions at this time 2D Echo LVEF 55 to 60%.  Moderate left ventricular hypertrophy LDL 143 HgbA1c 5.4 VTE prophylaxis - up ad lib at discharge       Diet    Diet heart healthy/carb modified Room service appropriate? Yes; Fluid consistency: Thin        Aspirin 325 mg and Plavix 75 mg prior to admission, now on no antithrombotics.  Will consider initiation of antiplatelets on outpatient neurology follow up.  Therapy recommendations: Home health PT and OT Disposition: Home with home health services   Hypertension CAD Home meds: Norvasc 5 mg, metoprolol 50 mg Stable Restart  home medications Long-term BP goal normotensive   Hyperlipidemia Home meds: Atorvastatin 80 mg, not resumed in hospital LDL 143, goal < 70 Consider switching to Crestor Consider restarting on discharge.   Dementia Continue home Namenda and galantamine   Other Stroke Risk Factors Advanced Age >/= 5  Former cigarette  smoker Coronary artery disease   Other Active Problems BPH Anemia-Hgb 12.7, stable Low potassium s/p repletion  DISCHARGE EXAM Blood pressure (!) 176/90, pulse 84, temperature (!) 97.4 F (36.3 C), temperature source Oral, resp. rate 18, height 6' (1.829 m), weight 92.7 kg, SpO2 98 %.  General - Well nourished, well developed, in no apparent distress. Cardiovascular - Regular rhythm and rate.   Mental Status -  Mental status: Awake, alert, follows commands, easily redirectable. Cognitive impairment, baseline. HEENT: Ecchymosis on the right forehead   Cranial Nerves II - XII - II - Visual field intact OU. III, IV, VI - Extraocular movements intact. V - Facial sensation intact bilaterally. VII - Facial movement intact bilaterally. VIII - Hearing & vestibular intact bilaterally. X - Palate elevates symmetrically. XI - Chin turning & shoulder shrug intact bilaterally. XII - Tongue protrusion intact.   Motor Strength - The patient's strength was normal in all extremities and pronator drift was absent.  Bulk was normal and fasciculations were absent.   Motor Tone - Muscle tone was assessed at the neck and appendages and was normal.   Sensory - Light touch assessed and symmetrical.     Coordination - The patient had normal movements in the hands and feet with no ataxia or dysmetria.  Tremor was absent.   Gait and Station - deferred.  Discharge Diet       Diet   Diet heart healthy/carb modified Room service appropriate? Yes; Fluid consistency: Thin   liquids  DISCHARGE PLAN Disposition:  Home with home health PT and OT No antithrombotic for secondary stroke prevention for now due to SDH and cerebellar subcortical hemorrhage.  Ongoing stroke risk factor control by Primary Care Physician at time of discharge Follow-up PCP Renford Dills, MD in 2 weeks. Follow-up in [facility] in 8 weeks, office to schedule an appointment.   35 minutes were  spent preparing discharge.  Lanae Boast, AGACNP-BC [facility] Pager: [phone]  I have personally obtained history,examined this patient, reviewed notes, independently viewed imaging studies, participated in medical decision making and plan of care.ROS completed by me personally and pertinent positives fully documented  I have made any additions or clarifications directly to the above note. Agree with note above.   Delia Heady, MD Medical Director [facility] Pager: [phone] 09/13/2022 1:52 PM

## 2022-09-13 NOTE — Progress Notes (Signed)
Discharge instructions reviewed with patient and wife. All questions answered. Ivs removed and telemetry discontinued. Patient belongings packed by wife. Patient to be wheeled off of unit for transport home.  Melony Overly, RN

## 2022-11-16 NOTE — Progress Notes (Signed)
[facility] [address] [place] [address] [phone]       HOSPITAL FOLLOW UP NOTE  Mr. Timothy Arias Date of Birth:  1935/10/25 Medical Record Number:  [id number]   Reason for Referral:  hospital stroke follow up    SUBJECTIVE:   CHIEF COMPLAINT:  Chief Complaint  Patient presents with   Follow-up    Rm 3, here with wife Timothy Arias Pt is here for hospital follow up after stroke. Pt states his feet and legs feel weak more so on the right, states other wise he is feeling better.     HPI:   Timothy Arias is a 87 y.o. male with history of ischemic heart disease, dementia, HTN, HLD, CAD, acute lower limb ischemia s/p stenting who initially presented to ED on 09/08/2022 as a level 2 trauma after a fall at home with head laceration.  Per ED note, remembers legs came out from underneath him but not sure exactly why he fell.  Wife noted shuffling when he ambulates recently.  Stroke workup revealed acute right traumatic right subdural hematoma and right cerebellar subcortical hemorrhage of indeterminate etiology.  On DAPT PTA, recommend holding and consider restarting at outpatient neurology follow-up.  On atorvastatin, LDL 143.  Therapies recommended home health therapy.   Today, 11/20/2022, patient is being seen for initial hospital follow-up accompanied by his wife.  Has been stable without new stroke/TIA symptoms.  Feels lower extremity weakness, R>L, present prior to hospitalization with slight worsening after, has been gradually improving. Some imbalance but also improving. Completed HH therapy. Use of cane at times, denies any recurrent falls. Ambulate some around his home such as going to the mailbox every day, uses pedal machine at home. At very end of visit upon providing patient with AVS, he reports bilateral foot numbness (more in toes and bottom of foot) which has been present since his stroke, initially more difficulty with ambulation but numbness  sensation has been gradually improving.  Has remained on atorvastatin.  Has not yet restarted DAPT as advised. Blood pressure today 147/62, does not routinely monitor at home Has since had f/u visit with PCP Dr. Nehemiah Settle, has appointment with vascular surgery in October.    PERTINENT IMAGING  CT head right cerebellar ICH, acute right subdural hematoma Repeat CT head stable CTA head & neck: Negative CTA for large vessel occlusion or other emergent finding. No vascular malformation seen underlying the right cerebral hemorrhage. Repeat CT with/without: ordered for tomorrow AM.  MRI ordered, unable to get due to metal in orbit.  Neurosurgery consulted no interventions at this time 2D Echo LVEF 55 to 60%.  Moderate left ventricular hypertrophy LDL 143 HgbA1c 5.4    ROS:   14 system review of systems performed and negative with exception of those listed in HPI  PMH:  Past Medical History:  Diagnosis Date   BPH (benign prostatic hyperplasia)    Colon polyps    Coronary artery disease    Dyslipidemia    ED (erectile dysfunction)    Hydrocele    Hyperlipidemia    Hypertension    MI (myocardial infarction) (HCC) 1992   Nocturnal enuresis     PSH:  Past Surgical History:  Procedure Laterality Date   ABDOMINAL AORTOGRAM W/LOWER EXTREMITY Bilateral 08/19/2021   Procedure: ABDOMINAL AORTOGRAM W/LOWER EXTREMITY;  Surgeon: Leonie Douglas, MD;  Location: [facility] INVASIVE [facility];  Service: Cardiovascular;  Laterality: Bilateral;   COLONOSCOPY W/ POLYPECTOMY     CORONARY  ANGIOPLASTY     with stents   KNEE ARTHROSCOPY     PERIPHERAL VASCULAR INTERVENTION  08/19/2021   Procedure: PERIPHERAL VASCULAR INTERVENTION;  Surgeon: Leonie Douglas, MD;  Location: [facility] INVASIVE CV LAB;  Service: Cardiovascular;;  Right Iliac   PERIPHERAL VASCULAR INTERVENTION Bilateral 06/27/2022   Procedure: PERIPHERAL VASCULAR INTERVENTION;  Surgeon: Nada Libman, MD;  Location: [facility]VAS[facility];  Service:  Cardiovascular;  Laterality: Bilateral;  [place]   PERIPHERAL VASCULAR THROMBECTOMY N/A 06/26/2022   Procedure: PERIPHERAL VASCULAR THROMBECTOMY;  Surgeon: Victorino Sparrow, MD;  Location: [facility] INVASIVE CV LAB;  Service: Cardiovascular;  Laterality: N/A;   PERIPHERAL VASCULAR THROMBECTOMY N/A 06/27/2022   Procedure: LYSIS RECHECK;  Surgeon: Nada Libman, MD;  Location: [facility] INVASIVE [facility];  Service: Cardiovascular;  Laterality: N/A;   RECTAL VILLOUS ADENOMA EXCISION     VASECTOMY      Social History:  Social History   Socioeconomic History   Marital status: Married    Spouse name: Not on file   Number of children: Not on file   Years of education: Not on file   Highest education level: Not on file  Occupational History   Not on file  Tobacco Use   Smoking status: Former    Current packs/day: 0.00    Types: Cigarettes    Quit date: 08/17/1990    Years since quitting: 32.2    Passive exposure: Never   Smokeless tobacco: Former    Types: Snuff   Tobacco comments:    quit 1992  Substance and Sexual Activity   Alcohol use: Yes    Comment: occasionally   Drug use: No   Sexual activity: Not on file  Other Topics Concern   Not on file  Social History Narrative   Not on file   Social Determinants of Health   Financial Resource Strain: Not on file  Food Insecurity: No Food Insecurity (06/27/2022)   Hunger Vital Sign    Worried About Running Out of Food in the Last Year: Never true    Ran Out of Food in the Last Year: Never true  Transportation Needs: No Transportation Needs (06/27/2022)   PRAPARE - Administrator, Civil Service (Medical): No    Lack of Transportation (Non-Medical): No  Physical Activity: Not on file  Stress: Not on file  Social Connections: Not on file  Intimate Partner Violence: Not At Risk (06/27/2022)   Humiliation, Afraid, Rape, and Kick questionnaire    Fear of Current or Ex-Partner: No    Emotionally Abused: No    Physically Abused: No     Sexually Abused: No    Family History: History reviewed. No pertinent family history.  Medications:   Current Outpatient Medications on File Prior to Visit  Medication Sig Dispense Refill   amLODipine (NORVASC) 5 MG tablet Take 5 mg by mouth daily.     atorvastatin (LIPITOR) 80 MG tablet Take 1 tablet (80 mg total) by mouth daily. 30 tablet 11   Cholecalciferol (VITAMIN D3) 50 MCG (2000 UT) capsule Take 2,000 Units by mouth at bedtime.     citalopram (CELEXA) 20 MG tablet Take 20 mg by mouth daily.     fluocinonide (LIDEX) 0.05 % external solution Apply 1 application. topically daily as needed (eczema).     fluticasone (FLONASE) 50 MCG/ACT nasal spray Place 1 spray into both nostrils daily as needed for congestion.     galantamine (RAZADYNE ER) 8 MG 24 hr capsule Take  2 capsules (16 mg total) by mouth at bedtime. (Patient taking differently: Take 16 mg by mouth daily at 12 noon.) 180 capsule 4   memantine (NAMENDA) 10 MG tablet Take 1 tablet (10 mg total) by mouth 2 (two) times daily. 180 tablet 4   metoprolol succinate (TOPROL-XL) 50 MG 24 hr tablet Take 50 mg by mouth daily.     Multiple Vitamin (MULTIVITAMIN WITH MINERALS) TABS Take 1 tablet by mouth at bedtime.     No current facility-administered medications on file prior to visit.    Allergies:  No Known Allergies    OBJECTIVE:  Physical Exam  Vitals:   11/20/22 1009  BP: (!) 147/62  Pulse: 65  Weight: 217 lb (98.4 kg)  Height: 6' (1.829 m)   Body mass index is 29.43 kg/m. No results found.   General: well developed, well nourished, very pleasant elderly Caucasian male, seated, in no evident distress Head: head normocephalic and atraumatic.   Neck: supple with no carotid or supraclavicular bruits Cardiovascular: regular rate and rhythm, no murmurs Musculoskeletal: no deformity Skin:  no rash/petichiae Vascular:  Normal pulses all extremities   Neurologic Exam Mental Status: Awake and fully alert. Oriented to  place and time. Recent memory impaired and remote memory intact. Attention span, concentration and fund of knowledge mostly appropriate during visit although wife supplements history. Mood and affect appropriate.  Cranial Nerves: Fundoscopic exam reveals sharp disc margins. Pupils equal, briskly reactive to light. Extraocular movements full without nystagmus. Visual fields full to confrontation.  HOH bilaterally. Facial sensation intact. Face, tongue, palate moves normally and symmetrically.  Motor: Normal bulk and tone. Normal strength in all tested extremity muscles.  Mild RUE action tremor, no evidence of tremor at rest or bradykinesia Sensory.: intact to touch , pinprick , position and vibratory sensation.  Coordination: Rapid alternating movements normal in all extremities. Finger-to-nose and heel-to-shin performed accurately bilaterally. Gait and Station: Arises from chair without difficulty. Stance is slightly hunched. Gait demonstrates normal stride length although slightly decreased step height, slightly veers towards right, mild imbalance. No use of AD.  Tandem walk and heel toe not attempted. Reflexes: 1+ and symmetric. Toes downgoing.     NIHSS  0 Modified Rankin  1      ASSESSMENT: THERESA DOHRMAN is a 87 y.o. year old male with acute right traumatic right subdural hematoma post fall and right cerebellar subcortical hemorrhage of indeterminate etiology on 09/08/2022. Vascular risk factors include ischemic heart disease, HTN, HLD, acute lower limb ischemia s/p stenting and dementia.      PLAN:  Right cerebellar ICH:  Right SDH, traumatic: Residual deficit: imbalance, lower extremity weakness. Slightly worse post stroke compared to baseline, has been improving.  Declines interest in additional therapy.  Encouraged continued HEP.  Recommend use of AD at all times for fall prevention. Bilateral foot numbness since stroke, low suspicion actually stroke related, has been gradually  improving. Recommend continued monitoring for now, if worsens can consider further evaluation or f/u with VVS Repeat CT head - if stable/resolved, can restart DAPT for hx of stents per VVS rec's - will call patient Continue atorvastatin (Lipitor) for secondary stroke prevention.   Discussed secondary stroke prevention measures and importance of close PCP follow up for aggressive stroke risk factor management including BP goal<130/90, HLD with LDL goal<70 and DM with A1c.<7 .  Stroke labs 08/2022: LDL 143, A1c 5.4 I have gone over the pathophysiology of stroke, warning signs and symptoms, risk factors and their management  in some detail with instructions to go to the closest emergency room for symptoms of concern. Dementia: Followed by Dr. Teresa Coombs, has f/u visit 05/2023, continue current medications     Doing well from stroke standpoint without further recommendations and risk factors are managed by PCP. He may follow up PRN, as usual for our patients who are strictly being followed for stroke. If any new neurological issues should arise, request PCP place referral for evaluation by one of our neurologists. Thank you.     CC:  [facility] provider: Dr. Pearlean Brownie PCP: Renford Dills, MD    I spent 59 minutes of face-to-face and non-face-to-face time with patient and wife.  This included previsit chart review including review of recent hospitalization, lab review, study review, order entry, electronic health record documentation, patient and wife education and discussion regarding above diagnoses and treatment plan and answered all other questions to patient's satisfaction   Ihor Austin, [facility]  [facility] [address] [place], [place] [address]  Phone [phone] Fax [phone] Note: This document was prepared with digital dictation and possible smart phrase technology. Any transcriptional errors that result from this process are unintentional.

## 2022-11-20 ENCOUNTER — Encounter: Payer: Self-pay | Admitting: Adult Health

## 2022-11-20 ENCOUNTER — Telehealth: Payer: Self-pay | Admitting: Adult Health

## 2022-11-20 ENCOUNTER — Ambulatory Visit (INDEPENDENT_AMBULATORY_CARE_PROVIDER_SITE_OTHER): Payer: HMO | Admitting: Adult Health

## 2022-11-20 VITALS — BP 147/62 | HR 65 | Ht 72.0 in | Wt 217.0 lb

## 2022-11-20 DIAGNOSIS — I618 Other nontraumatic intracerebral hemorrhage: Secondary | ICD-10-CM

## 2022-11-20 DIAGNOSIS — S065XAA Traumatic subdural hemorrhage with loss of consciousness status unknown, initial encounter: Secondary | ICD-10-CM | POA: Diagnosis not present

## 2022-11-20 NOTE — Telephone Encounter (Signed)
[employer] adv NPR sent to [facility] [phone] 

## 2022-11-20 NOTE — Patient Instructions (Signed)
You will a CT scan of your brain to assess for resolution of prior bleed - if resolved, you will be cleared to restart aspirin and Plavix  Continue atorvastatin for secondary stroke prevention  Continue to follow up with PCP regarding cholesterol and blood pressure management  Maintain strict control of hypertension with blood pressure goal below 130/90 and cholesterol with LDL cholesterol (bad cholesterol) goal below 70 mg/dL.   Signs of a Stroke? Follow the BEFAST method:  Balance Watch for a sudden loss of balance, trouble with coordination or vertigo Eyes Is there a sudden loss of vision in one or both eyes? Or double vision?  Face: Ask the person to smile. Does one side of the face droop or is it numb?  Arms: Ask the person to raise both arms. Does one arm drift downward? Is there weakness or numbness of a leg? Speech: Ask the person to repeat a simple phrase. Does the speech sound slurred/strange? Is the person confused ? Time: If you observe any of these signs, call 911.       Thank you for coming to see [place] at [facility]. I hope we have been able to provide you high quality care today.  You may receive a patient satisfaction survey over the next few weeks. We would appreciate your feedback and comments so that we may continue to improve ourselves and the health of our patients.    Hemorrhagic Stroke  A hemorrhagic stroke happens when a blood vessel in the brain leaks or bursts (ruptures). This causes bleeding in or around the brain (hemorrhage) and leads to the sudden death of brain tissue. A hemorrhagic stroke is a medical emergency. It can cause brain damage and death. There are two major types of hemorrhagic stroke: Intracerebral hemorrhage. This happens when bleeding occurs within the brain tissue. Subarachnoid hemorrhage. This happens when bleeding occurs in the area between the brain and the membrane that covers the brain (subarachnoid space). What are  the causes? A hemorrhagic stroke may be caused by: A head injury (trauma). Part of a weakened blood vessel wall bulging or ballooning out (aneurysm). Thin and hardened blood vessels due to a buildup of fatty deposits (plaque). Tangled blood vessels in the brain (arteriovenous malformation). Protein buildup on the artery walls of the brain (amyloid angiopathy). Inflamed blood vessels (vasculitis). A brain tumor. Sometimes, the cause of this condition is not known. What increases the risk? The following factors may make you more likely to develop this condition: High blood pressure (hypertension). Having abnormal blood vessels present since birth (congenital abnormality). Bleeding disorders, such as hemophilia, sickle cell disease, or liver disease. Taking blood thinners. Being an older adult. Moderate or heavy alcohol use. Using drugs, such as cocaine or methamphetamines. What are the signs or symptoms? Symptoms of this condition include: The sudden onset of: Weakness or numbness in your face, arm, or leg, especially on one side of your body. Loss of balance or coordination. Slurred speech, trouble speaking, trouble understanding speech, or a mix of these. Vision changes, such as double vision, blurred vision, or loss of vision. Dizziness or confusion. Nausea and vomiting. A severe headache with no known cause. Seizures. How is this diagnosed? A hemorrhagic stroke may be diagnosed based on: Your symptoms, medical history, and a physical exam. A CT scan or MRI of the brain. Imaging tests that scan blood flow in the brain (CT angiogram, MRI angiogram, or cerebral angiogram). How is this treated? This condition is an emergency. You  must get treatment at the first sign of stroke symptoms. Your treatment will depend on the length, severity, and cause of your symptoms. The goals of treatment are to stop the bleeding, control pressure in the brain, relieve symptoms, and prevent  complications. Treatment may include: Medicines that do the following: Lower blood pressure (antihypertensives). Relieve pain (analgesics), fever, nausea, or vomiting. Stop or prevent seizures (anticonvulsants). Prevent blood vessel spasms in the brain in response to bleeding. Use of a machine to help you breathe (ventilator). A blood transfusion to help your blood clot. Placement of a tube (drain) in the brain to relieve pressure. Surgery to stop the bleeding, remove a blood clot or tumor, or reduce pressure. These treatments may not work as well if too much time has passed since your stroke symptoms began. Even if you do not know when your symptoms began, get treatment as soon as possible. Follow these instructions at home: Medicines Take over-the-counter and prescription medicines only as told by your health care provider. Do not take medicines, such as aspirin and ibuprofen, unless your provider tells you to take them. These medicines can thin your blood and increase the risk of bleeding. Activity Return to your normal activities as told by your provider. Ask your provider what activities are safe for you. Take part in rehabilitation programs as told by your provider. This may include physical therapy, occupational therapy, or speech therapy. Use a walker or cane as told by your provider. Lifestyle Do not use any products that contain nicotine or tobacco. These products include cigarettes, chewing tobacco, and vaping devices, such as e-cigarettes. If you need help quitting, ask your provider. Do not drink alcohol if your provider tells you not to drink. General instructions Keep all follow-up visits. Your provider will need to monitor your recovery. Follow your provider's instructions about preventing falls. How is this prevented? You can reduce your risk of stroke by managing conditions, such as: High blood pressure. High cholesterol. Diabetes. Heart disease. Obesity. Other  factors and lifestyle changes that can lower your risk include: Quitting smoking, limiting alcohol, and staying physically active. Having your bloodwork monitored often if you take blood thinners. Contact a health care provider if: You develop any of the following symptoms: Headaches that keep coming back (that are chronic). Vision problems. Increased sensitivity to noise or light. Depression, mood swings, anxiety, or irritability. Memory problems, or trouble concentrating or paying attention. Sleep problems. Get help right away if:  You have a partial or total loss of consciousness. You are taking blood thinners and you fall or have a minor injury to the head. You have any symptoms of a stroke. "BE FAST" is an easy way to remember the main warning signs of a stroke: B - Balance.Signs are dizziness, sudden trouble walking, or loss of balance. E - Eyes.Signs are trouble seeing or a sudden change in vision. F - Face. Signs are sudden weakness or numbness of the face, or the face or eyelid drooping on one side. A - Arms. Signs are weakness or numbness in an arm. This happens suddenly and usually on one side of the body. S - Speech. Signs are sudden trouble speaking, slurred speech, or trouble understanding what people say. T - Time.Time to call emergency services. Write down what time symptoms started. You have other signs of a stroke, such as: A sudden, severe headache with no known cause. Nausea or vomiting. Seizure. These symptoms may be an emergency. Get help right away. Call 911. Do not  wait to see if the symptoms will go away. Do not drive yourself to the hospital. This information is not intended to replace advice given to you by your health care provider. Make sure you discuss any questions you have with your health care provider. Document Revised: 12/13/2021 Document Reviewed: 12/13/2021 Elsevier Patient Education  2024 [employer].

## 2022-11-27 ENCOUNTER — Ambulatory Visit (HOSPITAL_BASED_OUTPATIENT_CLINIC_OR_DEPARTMENT_OTHER)
Admission: RE | Admit: 2022-11-27 | Discharge: 2022-11-27 | Disposition: A | Discharge status: Home / Self Care | Payer: HMO | Source: Ambulatory Visit | Attending: Adult Health | Admitting: Adult Health

## 2022-11-27 DIAGNOSIS — I618 Other nontraumatic intracerebral hemorrhage: Secondary | ICD-10-CM | POA: Insufficient documentation

## 2022-11-28 ENCOUNTER — Telehealth: Payer: Self-pay

## 2022-11-28 NOTE — Telephone Encounter (Signed)
Called patient and was able to speak with his wife Aurea Graff. I informed her per Ihor Austin, NP "Patient requested to be called regarding results. Repeat CT head showed resolution of previously seen ICH.  He is cleared to restart prior regimen of aspirin and Plavix as advised by vascular surgery post stent. Thank you." Pt verbalized understanding. Pt had no questions at this time but was encouraged to call back if questions arise.

## 2022-11-28 NOTE — Telephone Encounter (Signed)
-----   Message from Ihor Austin sent at 11/28/2022  7:13 AM EDT ----- Patient requested to be called regarding results. Repeat CT head showed resolution of previously seen ICH.  He is cleared to restart prior regimen of aspirin and Plavix as advised by vascular surgery post stent. Thank you.

## 2023-02-06 ENCOUNTER — Ambulatory Visit (INDEPENDENT_AMBULATORY_CARE_PROVIDER_SITE_OTHER): Payer: HMO | Admitting: Physician Assistant

## 2023-02-06 ENCOUNTER — Ambulatory Visit (HOSPITAL_COMMUNITY)
Admission: RE | Admit: 2023-02-06 | Discharge: 2023-02-06 | Disposition: A | Discharge status: Home / Self Care | Payer: HMO | Source: Ambulatory Visit | Attending: Vascular Surgery | Admitting: Vascular Surgery

## 2023-02-06 ENCOUNTER — Ambulatory Visit (INDEPENDENT_AMBULATORY_CARE_PROVIDER_SITE_OTHER)
Admission: RE | Admit: 2023-02-06 | Discharge: 2023-02-06 | Discharge status: Home / Self Care | Payer: HMO | Source: Ambulatory Visit | Attending: Vascular Surgery

## 2023-02-06 VITALS — BP 158/78 | HR 62 | Temp 97.3°F | Resp 18 | Ht 72.0 in | Wt 219.5 lb

## 2023-02-06 DIAGNOSIS — I70213 Atherosclerosis of native arteries of extremities with intermittent claudication, bilateral legs: Secondary | ICD-10-CM

## 2023-02-06 DIAGNOSIS — I739 Peripheral vascular disease, unspecified: Secondary | ICD-10-CM

## 2023-02-06 LAB — VAS US ABI WITH/WO TBI
Left ABI: 1.01
Right ABI: 1.11

## 2023-02-06 NOTE — Progress Notes (Signed)
Office Note     CC:  follow up Requesting Provider:  Renford Dills, MD  HPI: Timothy Arias is a 87 y.o. (March 29, 1936) male who presents for evaluation of PAD.  He underwent thrombolysis of right iliac artery stents in March of this year due to acute limb ischemia.  This involved thrombolysis with restenting of the right common and external iliac arteries.  He also has history of left common iliac artery stenting.  He has a history of dementia as he is accompanied by his wife who is the primary historian.  He is complaining of foot pain however is unable to describe it.  Unfortunately he developed a brain in May of this year.  He has since restarted his Plavix and aspirin on a daily basis.  He is ambulatory with a cane however his wife states he does not walk much.   Past Medical History:  Diagnosis Date   BPH (benign prostatic hyperplasia)    Colon polyps    Coronary artery disease    Dyslipidemia    ED (erectile dysfunction)    Hydrocele    Hyperlipidemia    Hypertension    MI (myocardial infarction) (HCC) 1992   Nocturnal enuresis     Past Surgical History:  Procedure Laterality Date   ABDOMINAL AORTOGRAM W/LOWER EXTREMITY Bilateral 08/19/2021   Procedure: ABDOMINAL AORTOGRAM W/LOWER EXTREMITY;  Surgeon: Leonie Douglas, MD;  Location: [facility];  Service: Cardiovascular;  Laterality: Bilateral;   COLONOSCOPY W/ POLYPECTOMY     CORONARY ANGIOPLASTY     with stents   KNEE ARTHROSCOPY     PERIPHERAL VASCULAR INTERVENTION  08/19/2021   Procedure: PERIPHERAL VASCULAR INTERVENTION;  Surgeon: Leonie Douglas, MD;  Location: [facility];  Service: Cardiovascular;;  Right Iliac   PERIPHERAL VASCULAR INTERVENTION Bilateral 06/27/2022   Procedure: PERIPHERAL VASCULAR INTERVENTION;  Surgeon: Nada Libman, MD;  Location: [facility];  Service: Cardiovascular;  Laterality: Bilateral;  [place]   PERIPHERAL VASCULAR THROMBECTOMY N/A 06/26/2022   Procedure: PERIPHERAL  VASCULAR THROMBECTOMY;  Surgeon: Victorino Sparrow, MD;  Location: [facility];  Service: Cardiovascular;  Laterality: N/A;   PERIPHERAL VASCULAR THROMBECTOMY N/A 06/27/2022   Procedure: LYSIS RECHECK;  Surgeon: Nada Libman, MD;  Location: [facility];  Service: Cardiovascular;  Laterality: N/A;   RECTAL VILLOUS ADENOMA EXCISION     VASECTOMY      Social History   Socioeconomic History   Marital status: Married    Spouse name: Not on file   Number of children: Not on file   Years of education: Not on file   Highest education level: Not on file  Occupational History   Not on file  Tobacco Use   Smoking status: Former    Current packs/day: 0.00    Types: Cigarettes    Quit date: 08/17/1990    Years since quitting: 32.4    Passive exposure: Never   Smokeless tobacco: Former    Types: Snuff   Tobacco comments:    quit 1992  Substance and Sexual Activity   Alcohol use: Yes    Comment: occasionally   Drug use: No   Sexual activity: Not on file  Other Topics Concern   Not on file  Social History Narrative   Not on file   Social Determinants of Health   Financial Resource Strain: Not on file  Food Insecurity: No Food Insecurity (06/27/2022)   Hunger Vital Sign    Worried  About Running Out of Food in the Last Year: Never true    Ran Out of Food in the Last Year: Never true  Transportation Needs: No Transportation Needs (06/27/2022)   PRAPARE - Administrator, Civil Service (Medical): No    Lack of Transportation (Non-Medical): No  Physical Activity: Not on file  Stress: Not on file  Social Connections: Not on file  Intimate Partner Violence: Not At Risk (06/27/2022)   Humiliation, Afraid, Rape, and Kick questionnaire    Fear of Current or Ex-Partner: No    Emotionally Abused: No    Physically Abused: No    Sexually Abused: No   No family history on file.  Current Outpatient Medications  Medication Sig Dispense Refill   levothyroxine  (SYNTHROID) 25 MCG tablet Take 25 mcg by mouth daily before breakfast.     amLODipine (NORVASC) 5 MG tablet Take 5 mg by mouth daily.     atorvastatin (LIPITOR) 80 MG tablet Take 1 tablet (80 mg total) by mouth daily. 30 tablet 11   Cholecalciferol (VITAMIN D3) 50 MCG (2000 UT) capsule Take 2,000 Units by mouth at bedtime.     citalopram (CELEXA) 20 MG tablet Take 20 mg by mouth daily.     fluocinonide (LIDEX) 0.05 % external solution Apply 1 application. topically daily as needed (eczema).     fluticasone (FLONASE) 50 MCG/ACT nasal spray Place 1 spray into both nostrils daily as needed for congestion.     galantamine (RAZADYNE ER) 8 MG 24 hr capsule Take 2 capsules (16 mg total) by mouth at bedtime. (Patient taking differently: Take 16 mg by mouth daily at 12 noon.) 180 capsule 4   memantine (NAMENDA) 10 MG tablet Take 1 tablet (10 mg total) by mouth 2 (two) times daily. 180 tablet 4   metoprolol succinate (TOPROL-XL) 50 MG 24 hr tablet Take 50 mg by mouth daily.     Multiple Vitamin (MULTIVITAMIN WITH MINERALS) TABS Take 1 tablet by mouth at bedtime.     No current facility-administered medications for this visit.    No Known Allergies   REVIEW OF SYSTEMS:   [X]  denotes positive finding, [ ]  denotes negative finding Cardiac  Comments:  Chest pain or chest pressure:    Shortness of breath upon exertion:    Short of breath when lying flat:    Irregular heart rhythm:        Vascular    Pain in calf, thigh, or hip brought on by ambulation:    Pain in feet at night that wakes you up from your sleep:     Blood clot in your veins:    Leg swelling:         Pulmonary    Oxygen at home:    Productive cough:     Wheezing:         Neurologic    Sudden weakness in arms or legs:     Sudden numbness in arms or legs:     Sudden onset of difficulty speaking or slurred speech:    Temporary loss of vision in one eye:     Problems with dizziness:         Gastrointestinal    Blood in  stool:     Vomited blood:         Genitourinary    Burning when urinating:     Blood in urine:        Psychiatric    Major depression:  Hematologic    Bleeding problems:    Problems with blood clotting too easily:        Skin    Rashes or ulcers:        Constitutional    Fever or chills:      PHYSICAL EXAMINATION:  Vitals:   02/06/23 0957  BP: (!) 158/78  Pulse: 62  Resp: 18  Temp: (!) 97.3 F (36.3 C)  SpO2: 96%  Weight: 219 lb 8 oz (99.6 kg)  Height: 6' (1.829 m)    General:  WDWN in NAD; vital signs documented above Gait: Not observed HENT: WNL, normocephalic Pulmonary: normal non-labored breathing , without Rales, rhonchi,  wheezing Cardiac: regular HR Abdomen: soft, NT, no masses Skin: without rashes Vascular Exam/Pulses: Palpable right DP and PT pulse; palpable left PT pulse Extremities: without ischemic changes, without Gangrene , without cellulitis; without open wounds;  Musculoskeletal: no muscle wasting or atrophy  Neurologic: A&O X 3 Psychiatric:  The pt has Normal affect.   Non-Invasive Vascular Imaging:   Aortoiliac duplex demonstrates widely patent iliac artery stents bilaterally  ABI/TBIToday's ABIToday's TBIPrevious ABIPrevious TBI  +-------+-----------+-----------+------------+------------+  Right 1.11       0.82       1.05        0.61          +-------+-----------+-----------+------------+------------+  Left  1.01       0.71       1.04        0.62          +-------+-----------+-----------+------------+------------+     ASSESSMENT/PLAN:: 87 y.o. male here for follow up for surveillance of PAD  Bilateral lower extremities are well-perfused with palpable pedal pulses.  Aortoiliac duplex demonstrates widely patent iliac stents bilaterally.  He has stable ABIs and TBI's with toe pressures above 100 on both sides.  He complains of foot pain on both sides however is unable to describe it.  He also has very long toenails  and callus formation on both feet.  He will be referred to podiatry for footcare and further evaluation of foot pain.  He will follow-up with [place] for repeat aortoiliac duplex and ABIs in 1 year.  He will continue his aspirin, Plavix, statin daily.   Emilie Rutter, PA-C [facility] [phone]  Clinic MD:   [facility]

## 2023-02-10 ENCOUNTER — Other Ambulatory Visit: Payer: Self-pay | Admitting: Physician Assistant

## 2023-02-12 ENCOUNTER — Other Ambulatory Visit: Payer: Self-pay

## 2023-02-19 ENCOUNTER — Ambulatory Visit (INDEPENDENT_AMBULATORY_CARE_PROVIDER_SITE_OTHER): Payer: HMO | Admitting: Podiatry

## 2023-02-19 DIAGNOSIS — I739 Peripheral vascular disease, unspecified: Secondary | ICD-10-CM | POA: Diagnosis not present

## 2023-02-19 DIAGNOSIS — B351 Tinea unguium: Secondary | ICD-10-CM | POA: Diagnosis not present

## 2023-02-19 DIAGNOSIS — L84 Corns and callosities: Secondary | ICD-10-CM | POA: Diagnosis not present

## 2023-02-19 DIAGNOSIS — M79675 Pain in left toe(s): Secondary | ICD-10-CM | POA: Diagnosis not present

## 2023-02-19 DIAGNOSIS — M79674 Pain in right toe(s): Secondary | ICD-10-CM

## 2023-02-19 NOTE — Progress Notes (Unsigned)
Subjective:  Patient ID: Timothy Arias, male    DOB: [birth year],  MRN: [id number]  Timothy Arias presents to clinic today for: Patient notes nails are thick and elongated, causing pain in shoe gear when ambulating.  He has painful calluses/corns on plantar aspect of left foot  PCP is Renford Dills, MD.  Last seen 01/26/23  Past Medical History:  Diagnosis Date   BPH (benign prostatic hyperplasia)    Colon polyps    Coronary artery disease    Dyslipidemia    ED (erectile dysfunction)    Hydrocele    Hyperlipidemia    Hypertension    MI (myocardial infarction) (HCC) 1992   Nocturnal enuresis    No Known Allergies  Objective:  Timothy Arias is a pleasant 87 y.o. male in NAD. AAO x 3.  Vascular Examination: Patient has palpable DP pulse, absent PT pulse bilateral.  Delayed capillary refill bilateral toes.  Sparse digital hair bilateral.  Proximal to distal cooling WNL bilateral.    Dermatological Examination: Interspaces are clear with no open lesions noted bilateral.  Skin is shiny and atrophic bilateral.  Nails are 3-20mm thick, with yellowish/brown discoloration, subungual debris and distal onycholysis x10.  There is pain with compression of nails x10.  There are hyperkeratotic lesions noted left submet 1 and left submet 5.     Latest Ref Rng & Units 09/10/2022    6:58 AM  Hemoglobin A1C  Hemoglobin-A1c 4.8 - 5.6 % 5.4     Patient qualifies for at-risk foot care because of PVD .  Assessment/Plan: 1. Pain due to onychomycosis of toenails of both feet   2. Corns   3. PVD (peripheral vascular disease) (HCC)    Mycotic nails x10 were sharply debrided with sterile nail nippers and power debriding burr to decrease bulk and length.  Hyperkeratotic lesions x2 were shaved with #312 blade.   Return in about 3 months (around 05/22/2023) for RFC.   Clerance Lav, DPM, FACFAS [facility]     [address] [place], [place] [address]                Office [phone]  Fax [phone]

## 2023-03-02 ENCOUNTER — Other Ambulatory Visit: Payer: Self-pay

## 2023-03-02 DIAGNOSIS — I70213 Atherosclerosis of native arteries of extremities with intermittent claudication, bilateral legs: Secondary | ICD-10-CM

## 2023-05-28 ENCOUNTER — Encounter: Payer: Self-pay | Admitting: Neurology

## 2023-05-28 ENCOUNTER — Ambulatory Visit: Payer: HMO | Admitting: Neurology

## 2023-05-28 VITALS — BP 148/85 | HR 78 | Ht 72.0 in | Wt 218.0 lb

## 2023-05-28 DIAGNOSIS — G301 Alzheimer's disease with late onset: Secondary | ICD-10-CM | POA: Diagnosis not present

## 2023-05-28 DIAGNOSIS — F02A0 Dementia in other diseases classified elsewhere, mild, without behavioral disturbance, psychotic disturbance, mood disturbance, and anxiety: Secondary | ICD-10-CM | POA: Diagnosis not present

## 2023-05-28 DIAGNOSIS — S065XAA Traumatic subdural hemorrhage with loss of consciousness status unknown, initial encounter: Secondary | ICD-10-CM

## 2023-05-28 NOTE — Patient Instructions (Signed)
Continue to follow up with Dr. Nehemiah Settle  Continue your other medications  Return as needed

## 2023-05-28 NOTE — Progress Notes (Signed)
[facility]  PATIENT: Timothy Arias DOB: [birth year]  REQUESTING CLINICIAN: Kirby Funk, MD HISTORY FROM: Patient and spouse  REASON FOR VISIT: Worsening memory for the past year    HISTORICAL  CHIEF COMPLAINT:  Chief Complaint  Patient presents with   Memory Loss    Rm12, wife present, Memory loss: moca score was 16   INTERVAL HISTORY 05/28/2023 Patient presents today for a follow-up, he is accompanied by wife.  He does not remember meeting me a year ago.  Wife tells me that his symptoms are stable.  He needs reminder, he is repetitive, but still drives to familiar places less than a mile away.  Denies being lost while driving and no recent accident.  No other complaints.  He is compliant with his medications including galantamine and Namenda.  Unfortunate patient fell in May, taken to the hospital found to have a subdural hemorrhage.  He was treated conservatively.  In August he did have repeat head CT which showed resolution of the hemorrhage.  He has not had any additional fall.   INTERVAL HISTORY 05/29/2022: Patient presents today, he is accompanied by wife. Last visit was a 6 months ago, since then, wife reports that he has been stable but he still is forgetful about recent events. For example needs constant reminder regarding today's appointment. He also needs reminder regarding self care. Wife took over the finance because he forget to pay some of the bills. He still drive, no recent accident and has not been lost in familiar places. Sleep is good, denies any agitation or irritability and no hallucinations.    HISTORY OF PRESENT ILLNESS:  This is a 88 year old man with past medical history of dementia, on galantamine, hypertension hyperlipidemia presenting with worsening memory.  Wife reported the memory has been worse in the past year, he has difficulty with short-term memory and patient reported remembering date is very hard for me for him.  He still drive  and sometimes will forget his way to familiar places.  1 thing that he noted that he is sleeps a lot.  He still independent somewhat, still pays bills on time.    TBI:   No past history of TBI Stroke:   no past history of stroke Seizures:   no past history of seizures Sleep:   no history of sleep apnea.   Mood:   patient denies anxiety and depression  Functional status: dependent in most ADLs and IADLs Patient lives with spouse  Cooking: no   Cleaning: no   Shopping: no  Bathing: needs reminders Toileting: No help needed  Driving: Still drives to familiar places less than a mile away Bills: Denies   Ever left the stove on by accident?: no  Forget how to use items around the house?: no Getting lost going to familiar places?: yes  Forgetting loved ones names?: no  Word finding difficulty? Yes Sleep: Sleeps a lot    OTHER MEDICAL CONDITIONS: Hypertension, Hyperlipidemia, Hypothyroidism, heart disease.    REVIEW OF SYSTEMS: Full 14 system review of systems performed and negative with exception of: as noted in the HPI   ALLERGIES: No Known Allergies  HOME MEDICATIONS: Outpatient Medications Prior to Visit  Medication Sig Dispense Refill   amLODipine (NORVASC) 5 MG tablet Take 5 mg by mouth daily.     aspirin 325 MG tablet Take 325 mg by mouth daily.     atorvastatin (LIPITOR) 80 MG tablet Take 1 tablet (80 mg total) by mouth  daily. 30 tablet 11   Cholecalciferol (VITAMIN D3) 50 MCG (2000 UT) capsule Take 2,000 Units by mouth at bedtime.     citalopram (CELEXA) 20 MG tablet Take 20 mg by mouth daily.     clopidogrel (PLAVIX) 75 MG tablet TAKE 1 TABLET BY MOUTH EVERY DAY 90 tablet 3   fluocinonide (LIDEX) 0.05 % external solution Apply 1 application. topically daily as needed (eczema).     fluticasone (FLONASE) 50 MCG/ACT nasal spray Place 1 spray into both nostrils daily as needed for congestion.     galantamine (RAZADYNE ER) 8 MG 24 hr capsule Take 2 capsules (16 mg total) by  mouth at bedtime. (Patient taking differently: Take 16 mg by mouth daily at 12 noon.) 180 capsule 4   levothyroxine (SYNTHROID) 25 MCG tablet Take 25 mcg by mouth daily before breakfast.     memantine (NAMENDA) 10 MG tablet Take 1 tablet (10 mg total) by mouth 2 (two) times daily. 180 tablet 4   metoprolol succinate (TOPROL-XL) 50 MG 24 hr tablet Take 50 mg by mouth daily.     Multiple Vitamin (MULTIVITAMIN WITH MINERALS) TABS Take 1 tablet by mouth at bedtime.     No facility-administered medications prior to visit.    PAST MEDICAL HISTORY: Past Medical History:  Diagnosis Date   BPH (benign prostatic hyperplasia)    Colon polyps    Coronary artery disease    Dyslipidemia    ED (erectile dysfunction)    Hydrocele    Hyperlipidemia    Hypertension    MI (myocardial infarction) (HCC) 1992   Nocturnal enuresis     PAST SURGICAL HISTORY: Past Surgical History:  Procedure Laterality Date   ABDOMINAL AORTOGRAM W/LOWER EXTREMITY Bilateral 08/19/2021   Procedure: ABDOMINAL AORTOGRAM W/LOWER EXTREMITY;  Surgeon: Leonie Douglas, MD;  Location: [facility];  Service: Cardiovascular;  Laterality: Bilateral;   COLONOSCOPY W/ POLYPECTOMY     CORONARY ANGIOPLASTY     with stents   KNEE ARTHROSCOPY     PERIPHERAL VASCULAR INTERVENTION  08/19/2021   Procedure: PERIPHERAL VASCULAR INTERVENTION;  Surgeon: Leonie Douglas, MD;  Location: [facility];  Service: Cardiovascular;;  Right Iliac   PERIPHERAL VASCULAR INTERVENTION Bilateral 06/27/2022   Procedure: PERIPHERAL VASCULAR INTERVENTION;  Surgeon: Nada Libman, MD;  Location: [facility];  Service: Cardiovascular;  Laterality: Bilateral;  [place]   PERIPHERAL VASCULAR THROMBECTOMY N/A 06/26/2022   Procedure: PERIPHERAL VASCULAR THROMBECTOMY;  Surgeon: Victorino Sparrow, MD;  Location: [facility] INVASIVE CV LAB;  Service: Cardiovascular;  Laterality: N/A;   PERIPHERAL VASCULAR THROMBECTOMY N/A 06/27/2022   Procedure: LYSIS RECHECK;   Surgeon: Nada Libman, MD;  Location: [facility];  Service: Cardiovascular;  Laterality: N/A;   RECTAL VILLOUS ADENOMA EXCISION     VASECTOMY      FAMILY HISTORY: History reviewed. No pertinent family history.  SOCIAL HISTORY: Social History   Socioeconomic History   Marital status: Married    Spouse name: Not on file   Number of children: Not on file   Years of education: Not on file   Highest education level: Not on file  Occupational History   Not on file  Tobacco Use   Smoking status: Former    Current packs/day: 0.00    Types: Cigarettes    Quit date: 08/17/1990    Years since quitting: 32.8    Passive exposure: Never   Smokeless tobacco: Former    Types: Snuff   Tobacco  comments:    quit 1992  Substance and Sexual Activity   Alcohol use: Yes    Comment: occasionally   Drug use: No   Sexual activity: Not on file  Other Topics Concern   Not on file  Social History Narrative   Not on file   Social Drivers of Health   Financial Resource Strain: Not on file  Food Insecurity: No Food Insecurity (06/27/2022)   Hunger Vital Sign    Worried About Running Out of Food in the Last Year: Never true    Ran Out of Food in the Last Year: Never true  Transportation Needs: No Transportation Needs (06/27/2022)   PRAPARE - Administrator, Civil Service (Medical): No    Lack of Transportation (Non-Medical): No  Physical Activity: Not on file  Stress: Not on file  Social Connections: Not on file  Intimate Partner Violence: Not At Risk (06/27/2022)   Humiliation, Afraid, Rape, and Kick questionnaire    Fear of Current or Ex-Partner: No    Emotionally Abused: No    Physically Abused: No    Sexually Abused: No    PHYSICAL EXAM  GENERAL EXAM/CONSTITUTIONAL: Vitals:  Vitals:   05/28/23 1338  BP: (!) 148/85  Pulse: 78  Weight: 218 lb (98.9 kg)  Height: 6' (1.829 m)     Body mass index is 29.57 kg/m. Wt Readings from Last 3 Encounters:  05/28/23  218 lb (98.9 kg)  02/06/23 219 lb 8 oz (99.6 kg)  11/20/22 217 lb (98.4 kg)   Patient is in no distress; well developed, nourished and groomed; neck is supple  MUSCULOSKELETAL: Gait, strength, tone, movements noted in Neurologic exam below  NEUROLOGIC: MENTAL STATUS:      No data to display            05/28/2023    1:40 PM 05/29/2022    2:00 PM 11/21/2021    2:19 PM  Montreal Cognitive Assessment   Visuospatial/ Executive (0/5) 4 5 4   Naming (0/3) 2 3 2   Attention: Read list of digits (0/2) 2 2 2   Attention: Read list of letters (0/1) 1 1 1   Attention: Serial 7 subtraction starting at 100 (0/3) 1 3 3   Language: Repeat phrase (0/2) 2 0 2  Language : Fluency (0/1) 0 0 0  Abstraction (0/2) 1 1 2   Delayed Recall (0/5) 0 4 0  Orientation (0/6) 3 1 2   Total 16 20 18   Adjusted Score (based on education)   19     CRANIAL NERVE:  2nd, 3rd, 4th, 6th - visual fields full to confrontation, extraocular muscles intact, no nystagmus 5th - facial sensation symmetric 7th - facial strength symmetric 8th - hearing intact 9th - palate elevates symmetrically, uvula midline 11th - shoulder shrug symmetric 12th - tongue protrusion midline  MOTOR:  normal bulk and tone, full strength in the BUE, BLE  SENSORY:  normal and symmetric to light touch, vibration  COORDINATION:  finger-nose-finger, fine finger movements normal  GAIT/STATION:  normal   DIAGNOSTIC DATA (LABS, IMAGING, TESTING) - I reviewed patient records, labs, notes, testing and imaging myself where available.  Lab Results  Component Value Date   WBC 7.5 09/10/2022   HGB 13.3 09/10/2022   HCT 39.1 09/10/2022   MCV 88.5 09/10/2022   PLT 155 09/10/2022      Component Value Date/Time   NA 138 09/10/2022 0658   K 3.4 (L) 09/10/2022 0658   CL 105 09/10/2022 0658   CO2  22 09/10/2022 0658   GLUCOSE 136 (H) 09/10/2022 0658   BUN 8 09/10/2022 0658   CREATININE 0.88 09/10/2022 0658   CALCIUM 9.5 09/10/2022 0658    PROT 6.7 09/08/2022 1439   ALBUMIN 3.6 09/08/2022 1439   AST 27 09/08/2022 1439   ALT 19 09/08/2022 1439   ALKPHOS 102 09/08/2022 1439   BILITOT 0.7 09/08/2022 1439   GFRNONAA >60 09/10/2022 0658   GFRAA >60 06/09/2019 1705   Lab Results  Component Value Date   CHOL 208 (H) 06/28/2022   HDL 39 (L) 06/28/2022   LDLCALC 143 (H) 06/28/2022   TRIG 130 06/28/2022   CHOLHDL 5.3 06/28/2022   Lab Results  Component Value Date   HGBA1C 5.4 09/10/2022   Lab Results  Component Value Date   VITAMINB12 459 11/21/2021   Lab Results  Component Value Date   TSH 4.490 11/21/2021   ATN Profile consistent with Alzheimer disease pathology.  MRI Brain 09/12/2022 1. Stable right-sided subdural hematoma measuring up to 4 mm. No significant mass effect. 2. Unchanged right middle cerebellar peduncle hematoma with surrounding vasogenic edema. 3. Foci of susceptibility artifact in the right frontal lobe in a pattern suggestive of prior traumatic injury. 4. Mild chronic microvascular ischemic changes of the white matt  CT Head 11/27/2022 Resolution of the previously seen small right cerebellar hemorrhage. No acute intracranial abnormality.   ASSESSMENT AND PLAN  88 y.o. year old male with history of dementia, hypertension, hyperlipidemia who is presenting for follow up for his dementia.  In term of his dementia, he is stable, on Namenda and galantamine.  Tolerating the medications very well.  He still exhibits symptoms of dementia but no change in his behavior.  Regarding his traumatic subdural hematoma, repeat head CT in August showed resolution of the hematoma.  He is back on Plavix.  Again continue current medications and continue to follow with PCP and return as needed.    1. Mild late onset Alzheimer's dementia without behavioral disturbance, psychotic disturbance, mood disturbance, or anxiety (HCC)   2. SDH (subdural hematoma) [facility])      Patient Instructions  Continue to follow up with  Dr. Nehemiah Settle  Continue your other medications  Return as needed   No orders of the defined types were placed in this encounter.   No orders of the defined types were placed in this encounter.   Return if symptoms worsen or fail to improve.    Windell Norfolk, MD 05/28/2023, 7:04 PM  [facility] [address] [place], [place] [address] [phone]

## 2023-05-29 ENCOUNTER — Ambulatory Visit: Payer: HMO | Admitting: Podiatry

## 2023-05-29 DIAGNOSIS — M79674 Pain in right toe(s): Secondary | ICD-10-CM | POA: Diagnosis not present

## 2023-05-29 DIAGNOSIS — M79675 Pain in left toe(s): Secondary | ICD-10-CM

## 2023-05-29 DIAGNOSIS — L84 Corns and callosities: Secondary | ICD-10-CM

## 2023-05-29 DIAGNOSIS — I739 Peripheral vascular disease, unspecified: Secondary | ICD-10-CM

## 2023-05-29 DIAGNOSIS — M21622 Bunionette of left foot: Secondary | ICD-10-CM | POA: Diagnosis not present

## 2023-05-29 DIAGNOSIS — B351 Tinea unguium: Secondary | ICD-10-CM

## 2023-05-29 NOTE — Progress Notes (Signed)
 Subjective:  Patient ID: Timothy Arias, male    DOB: Jul 29, 1935,  MRN: [id number]  Timothy Arias presents to clinic today for:  Chief Complaint  Patient presents with   [facility]    Not diabetic. Takes Plavix  for anticoag.   Patient notes nails are thick and elongated, causing pain in shoe gear when ambulating.  He has a painful corn left submet 5 the left medial bunion area.  He is wondering what he can do to try to alleviate his symptoms.  He states they are very tender.  PCP is Rexanne Ingle, MD. date last seen was 05/02/2023  Past Medical History:  Diagnosis Date   BPH (benign prostatic hyperplasia)    Colon polyps    Coronary artery disease    Dyslipidemia    ED (erectile dysfunction)    Hydrocele    Hyperlipidemia    Hypertension    MI (myocardial infarction) (HCC) 1992   Nocturnal enuresis     No Known Allergies  Objective:  Timothy Arias is a pleasant 88 y.o. male in NAD. AAO x 3.  Vascular Examination: Patient has palpable DP pulse, absent PT pulse bilateral.  Delayed capillary refill bilateral toes.  Sparse digital hair bilateral.  Proximal to distal cooling WNL bilateral.    Dermatological Examination: Interspaces are clear with no open lesions noted bilateral.  Skin is shiny and atrophic bilateral.  Nails are 3-42mm thick, with yellowish/brown discoloration, subungual debris and distal onycholysis x10.  There is pain with compression of nails x10.  There are hyperkeratotic lesions noted on the plantar aspect of the left fifth metatarsal head as well as the plantar medial aspect of the left bunion with pain on palpation of the lesions.  No surrounding erythema is noted.  Neurological Examination: Epicritic sensation intact bilateral  Musculoskeletal Examination: Muscle strength 5/5 to all LE muscle groups b/l.  Bony prominence along the plantar lateral aspect of the left fifth metatarsal head     Latest Ref Rng & Units 09/10/2022    6:58 AM  Hemoglobin A1C   Hemoglobin-A1c 4.8 - 5.6 % 5.4    Patient qualifies for at-risk foot care because of PVD.  Assessment/Plan: 1. Pain due to onychomycosis of toenails of both feet   2. Corns   3. PVD (peripheral vascular disease) (HCC)     ORTHOTICS, PODIATRY (AMBULATORY)  Mycotic nails x10 were sharply debrided with sterile nail nippers and power debriding burr to decrease bulk and length.  Hyperkeratotic lesions x 2 (non-toes) were shaved with #312 blade.  Will set up a consultation with our pedorthist to have her evaluate him in his current shoes.  His shoe inserts might be able to have accommodations made directly to them to help offload his painful areas.  However, after her evaluation, patient was informed that she may feel that he would be best served with custom orthotics which can then be placed from shoe to shoe and more comfortable for him leaving him with a variety of shoes to wear them in as well.   Return in about 3 months (around 08/26/2023) for RFC.   Awanda CHARM Imperial, DPM, FACFAS [facility]     [address], [place] [address]  Office [phone]  Fax [phone]

## 2023-05-31 DIAGNOSIS — R109 Unspecified abdominal pain: Secondary | ICD-10-CM | POA: Diagnosis not present

## 2023-06-26 ENCOUNTER — Other Ambulatory Visit: Payer: HMO

## 2023-08-27 ENCOUNTER — Ambulatory Visit: Payer: HMO | Admitting: Podiatry

## 2023-08-27 DIAGNOSIS — M79675 Pain in left toe(s): Secondary | ICD-10-CM | POA: Diagnosis not present

## 2023-08-27 DIAGNOSIS — M79674 Pain in right toe(s): Secondary | ICD-10-CM

## 2023-08-27 DIAGNOSIS — L84 Corns and callosities: Secondary | ICD-10-CM | POA: Diagnosis not present

## 2023-08-27 DIAGNOSIS — B351 Tinea unguium: Secondary | ICD-10-CM

## 2023-08-27 DIAGNOSIS — I739 Peripheral vascular disease, unspecified: Secondary | ICD-10-CM | POA: Diagnosis not present

## 2023-08-27 NOTE — Progress Notes (Unsigned)
       Subjective:  Patient ID: Timothy Arias, male    DOB: 01/18/1936,  MRN: [id number]  Timothy Arias presents to clinic today for:  Chief Complaint  Patient presents with   [facility]    Toenail trim, Non diabetic   Patient notes nails are thick and elongated, causing pain in shoe gear when ambulating.  He has a painful corn left submet 5.  His current health team advantage prior authorization form does cover today's visit.  The patient will need a new prior authorization for the callus/corn debridement for future visits.  PCP is Merl Star, MD. date last seen was 07/03/2023  Past Medical History:  Diagnosis Date   BPH (benign prostatic hyperplasia)    Colon polyps    Coronary artery disease    Dyslipidemia    ED (erectile dysfunction)    Hydrocele    Hyperlipidemia    Hypertension    MI (myocardial infarction) (HCC) 1992   Nocturnal enuresis     No Known Allergies  Objective:  Timothy Arias is a pleasant 88 y.o. male in NAD. AAO x 3.  Vascular Examination: Patient has palpable DP pulse, absent PT pulse bilateral.  Delayed capillary refill bilateral toes.  Sparse digital hair bilateral.  Proximal to distal cooling WNL bilateral.    Dermatological Examination: Interspaces are clear with no open lesions noted bilateral.  Skin is shiny and atrophic bilateral.  Nails are 3-7mm thick, with yellowish/brown discoloration, subungual debris and distal onycholysis x10.  There is pain with compression of nails x10.  There is a focal hyperkeratotic lesion at the plantar aspect of the left fifth metatarsal head.     Latest Ref Rng & Units 09/10/2022    6:58 AM  Hemoglobin A1C  Hemoglobin-A1c 4.8 - 5.6 % 5.4    Patient qualifies for at-risk foot care because of PVD.  Assessment/Plan: 1. Pain due to onychomycosis of toenails of both feet   2. Corns   3. PVD (peripheral vascular disease) (HCC)    Mycotic nails x10 were sharply debrided with sterile nail nippers and power debriding  burr to decrease bulk and length.  Hyperkeratotic lesion left submet 5 was shaved uneventfully with a sterile #313 blade.  A reverse dancers pad was placed underneath his shoe insole to offload the fifth metatarsal head.  He noted immediate improvement upon standing   Return in about 3 months (around 11/27/2023) for [facility].   Joe Murders, DPM, FACFAS [facility]     [address] [place], [place] [address]                Office [phone]  Fax [phone]

## 2023-09-28 DIAGNOSIS — H9190 Unspecified hearing loss, unspecified ear: Secondary | ICD-10-CM | POA: Diagnosis not present

## 2023-10-03 ENCOUNTER — Ambulatory Visit: Attending: Internal Medicine | Admitting: Audiologist

## 2023-10-03 DIAGNOSIS — H903 Sensorineural hearing loss, bilateral: Secondary | ICD-10-CM | POA: Insufficient documentation

## 2023-10-03 NOTE — Procedures (Signed)
  Outpatient Rehabilitation and [facility] [address] [place], [place] [address] [phone]  AUDIOLOGICAL EVALUATION  Name: Timothy Arias    Status: Outpatient DOB: 07/16/1935     Referent: Merl Star, MD MRN: [id number] Date: 10/03/2023     Diagnosis: Sensorineural hearing loss, bilateral   HISTORY: Timothy Arias, 88 y.o., was seen for an audiological evaluation.   Timothy Arias notices increased difficulty hearing following spending time [place] a firing range with his son 3 weeks ago. He reports that they wore hearing protection, but his wife also notes.  Timothy Arias notes no ear pain, pressure, or tinnitus.  Timothy Arias reports no history of noise exposure. There is report of Alzheimer's Disease. Timothy Arias tried and returned hearing aids to [employer] in the fall, stating that he didn't need them and that they whistled all the time.   EVALUATION: Otoscopic inspection reveals clear ear canals with visible tympanic membranes.   Tympanometry was completed to assess middle ear status. Normal, Type A tympanograms were obtained bilaterally.  Standard audiometric techniques were used to obtain thresholds under insert earphones. Speech reception thresholds are 60 dBHL on the right and 60 dBHL on the left using recorded spondee word lists. Word recognition was 20% at 85 dBHL on the right and 16% at 85 dBHL on the left using recorded NU-6 word lists, in quiet. Word recognition test level was patient's most comfortable listening level. A moderate sloping to profound sensorineural hearing loss is present bilaterally.   CONCLUSION:  Timothy Arias has a moderate sloping to profound sensorineural hearing loss bilaterally.  Word recognition is very poor in quiet at loud conversational speech levels bilaterally. Findings were reviewed with the patient. They feel that the hearing loss was sudden, following time spent at the firing range. Timothy Arias was observed to struggle to hear conversational speech, but did much better when  listening through a PocketTalker assistive listening device.   RECOMMENDATIONS: Medical evaluation by an Ear, Nose and Throat physician due to sudden hearing loss.   Use of binaural amplification to assist in daily listening situations. A list of area hearing aid providers was given to the patient. Use of communication strategies to improve communication in difficult listening situations. Use of hearing protection in noisy situations.  Audiogram scanned in under media tab. 50 minutes spent testing and counseling family.  Timothy Arias, Au.D., CCC-A Audiologist 10/03/2023  cc: Merl Star, MD

## 2023-12-03 ENCOUNTER — Ambulatory Visit: Admitting: Podiatry

## 2024-01-11 ENCOUNTER — Other Ambulatory Visit: Payer: Self-pay | Admitting: Neurology

## 2024-01-14 ENCOUNTER — Ambulatory Visit: Admitting: Podiatry

## 2024-02-05 ENCOUNTER — Telehealth: Payer: Self-pay | Admitting: Neurology

## 2024-02-05 NOTE — Telephone Encounter (Signed)
 Pt's wife called wanting to speak to the nurse to be advised on what she can do regarding the pt's attitude change that he has been having for the last 2-3 weeks. Pt has been getting angry and that is not his usual self. She states that as the weeks go by the pt's memory worsens. Wife would like to be advised on what can she do or if there is any medication that can be given to him for the agitation. Please advise. SABRA

## 2024-02-06 ENCOUNTER — Other Ambulatory Visit: Payer: Self-pay | Admitting: Neurology
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
## 2024-02-06 NOTE — Telephone Encounter (Signed)
 Called wife. Relayed Dr. Janean recommendation.  She verbalized understanding. She will call back if anything else is needed. Did recommend she f/u with PCP to inquire if labs/Urinalysis would be appropriate to r/o infection.   Phone room: please call her back and schedule next available with Dr. Camara and add to wait list.

## 2024-02-06 NOTE — Telephone Encounter (Signed)
 Called wife back at [phone]. She was driving and will call back.

## 2024-02-06 NOTE — Telephone Encounter (Signed)
 The patient is on Celexa  and he needs to take it (this is maybe what causes his issues). If he does not take his meds, not sure if prescribing another medication is the solution. Please add them to the cancellation list.   Dr. Gregg

## 2024-02-06 NOTE — Telephone Encounter (Signed)
 Wife returned call. Please call back when available. [phone]

## 2024-02-15 ENCOUNTER — Other Ambulatory Visit: Payer: Self-pay

## 2024-02-15 DIAGNOSIS — I739 Peripheral vascular disease, unspecified: Secondary | ICD-10-CM

## 2024-02-15 DIAGNOSIS — I70213 Atherosclerosis of native arteries of extremities with intermittent claudication, bilateral legs: Secondary | ICD-10-CM

## 2024-02-18 ENCOUNTER — Ambulatory Visit: Admitting: Podiatry

## 2024-03-12 DIAGNOSIS — Z1331 Encounter for screening for depression: Secondary | ICD-10-CM | POA: Diagnosis not present

## 2024-03-12 DIAGNOSIS — I10 Essential (primary) hypertension: Secondary | ICD-10-CM | POA: Diagnosis not present

## 2024-03-12 DIAGNOSIS — F3341 Major depressive disorder, recurrent, in partial remission: Secondary | ICD-10-CM | POA: Diagnosis not present

## 2024-03-12 DIAGNOSIS — Z0000 Encounter for general adult medical examination without abnormal findings: Secondary | ICD-10-CM | POA: Diagnosis not present

## 2024-03-12 DIAGNOSIS — E7800 Pure hypercholesterolemia, unspecified: Secondary | ICD-10-CM | POA: Diagnosis not present

## 2024-03-12 DIAGNOSIS — E039 Hypothyroidism, unspecified: Secondary | ICD-10-CM | POA: Diagnosis not present

## 2024-03-12 DIAGNOSIS — F0390 Unspecified dementia without behavioral disturbance: Secondary | ICD-10-CM | POA: Diagnosis not present

## 2024-03-12 DIAGNOSIS — I739 Peripheral vascular disease, unspecified: Secondary | ICD-10-CM | POA: Diagnosis not present

## 2024-03-12 DIAGNOSIS — R7301 Impaired fasting glucose: Secondary | ICD-10-CM | POA: Diagnosis not present

## 2024-03-17 ENCOUNTER — Ambulatory Visit (HOSPITAL_BASED_OUTPATIENT_CLINIC_OR_DEPARTMENT_OTHER)
Admission: RE | Admit: 2024-03-17 | Discharge: 2024-03-17 | Disposition: A | Discharge status: Home / Self Care | Source: Ambulatory Visit | Attending: Surgery | Admitting: Surgery

## 2024-03-17 ENCOUNTER — Ambulatory Visit (HOSPITAL_COMMUNITY)
Admission: RE | Admit: 2024-03-17 | Discharge: 2024-03-17 | Disposition: A | Discharge status: Home / Self Care | Source: Ambulatory Visit | Attending: Surgery | Admitting: Surgery

## 2024-03-17 DIAGNOSIS — I70213 Atherosclerosis of native arteries of extremities with intermittent claudication, bilateral legs: Secondary | ICD-10-CM | POA: Insufficient documentation

## 2024-03-17 DIAGNOSIS — I739 Peripheral vascular disease, unspecified: Secondary | ICD-10-CM | POA: Diagnosis not present

## 2024-03-17 LAB — VAS US ABI WITH/WO TBI
Left ABI: 1.06
Right ABI: 1.15

## 2024-03-25 ENCOUNTER — Encounter (HOSPITAL_COMMUNITY)

## 2024-03-25 ENCOUNTER — Ambulatory Visit: Attending: Surgery | Admitting: Physician Assistant

## 2024-03-25 VITALS — BP 152/78 | HR 61 | Temp 98.1°F | Wt 226.3 lb

## 2024-03-25 DIAGNOSIS — I739 Peripheral vascular disease, unspecified: Secondary | ICD-10-CM

## 2024-03-25 DIAGNOSIS — I70213 Atherosclerosis of native arteries of extremities with intermittent claudication, bilateral legs: Secondary | ICD-10-CM | POA: Diagnosis not present

## 2024-03-25 NOTE — Progress Notes (Signed)
 Office Note     CC:  follow up Requesting Provider:  Rexanne Ingle, MD  HPI: Timothy Arias is a 88 y.o. (1935/07/10) male who presents for surveillance of PAD.  He has history of right common and external iliac stenting in April 2023 by Dr. Magda due to claudication.  He developed right leg acute limb ischemia due to thrombosed iliac stent and thus underwent thrombolysis on 3//24 by Dr. Lanis.  He was brought back to the Cath Lab for thrombolytic recheck the following day by Dr. Serene and underwent mechanical thrombectomy of the right common and external iliac artery with restenting of the right common and external iliac artery as well as stenting of the left common iliac artery.    Past medical history also significant for dementia.  He is here with his wife who is the primary historian.  She states he is minimally active during the day.  He likes to sit in his recliner and take naps throughout most of the day.  He is able to walk without a cane or a walker however does not walk far.  He does not have any rest pain or tissue loss of bilateral lower extremities.  He is on Plavix  and statin daily.   Past Medical History:  Diagnosis Date   BPH (benign prostatic hyperplasia)    Colon polyps    Coronary artery disease    Dyslipidemia    ED (erectile dysfunction)    Hydrocele    Hyperlipidemia    Hypertension    MI (myocardial infarction) (HCC) 1992   Nocturnal enuresis     Past Surgical History:  Procedure Laterality Date   ABDOMINAL AORTOGRAM W/LOWER EXTREMITY Bilateral 08/19/2021   Procedure: ABDOMINAL AORTOGRAM W/LOWER EXTREMITY;  Surgeon: Magda Debby SAILOR, MD;  Location: [facility];  Service: Cardiovascular;  Laterality: Bilateral;   COLONOSCOPY W/ POLYPECTOMY     CORONARY ANGIOPLASTY     with stents   KNEE ARTHROSCOPY     PERIPHERAL VASCULAR INTERVENTION  08/19/2021   Procedure: PERIPHERAL VASCULAR INTERVENTION;  Surgeon: Magda Debby SAILOR, MD;  Location: [facility];  Service: Cardiovascular;;  Right Iliac   PERIPHERAL VASCULAR INTERVENTION Bilateral 06/27/2022   Procedure: PERIPHERAL VASCULAR INTERVENTION;  Surgeon: Serene Gaile ORN, MD;  Location: [facility];  Service: Cardiovascular;  Laterality: Bilateral;  [place]   PERIPHERAL VASCULAR THROMBECTOMY N/A 06/26/2022   Procedure: PERIPHERAL VASCULAR THROMBECTOMY;  Surgeon: Lanis Fonda BRAVO, MD;  [facility];  Service: Cardiovascular;  Laterality: N/A;   PERIPHERAL VASCULAR THROMBECTOMY N/A 06/27/2022   Procedure: LYSIS RECHECK;  Surgeon: Serene Gaile ORN, MD;  Location: [facility];  Service: Cardiovascular;  Laterality: N/A;   RECTAL VILLOUS ADENOMA EXCISION     VASECTOMY      Social History   Socioeconomic History   Marital status: Married    Spouse name: Not on file   Number of children: Not on file   Years of education: Not on file   Highest education level: Not on file  Occupational History   Not on file  Tobacco Use   Smoking status: Former    Current packs/day: 0.00    Types: Cigarettes    Quit date: 08/17/1990    Years since quitting: 33.6    Passive exposure: Never   Smokeless tobacco: Former    Types: Snuff   Tobacco comments:    quit 1992  Substance and Sexual Activity   Alcohol use: Yes  Comment: occasionally   Drug use: No   Sexual activity: Not on file  Other Topics Concern   Not on file  Social History Narrative   Not on file   Social Drivers of Health   Financial Resource Strain: Not on file  Food Insecurity: No Food Insecurity (06/27/2022)   Hunger Vital Sign    Worried About Running Out of Food in the Last Year: Never true    Ran Out of Food in the Last Year: Never true  Transportation Needs: No Transportation Needs (06/27/2022)   PRAPARE - Administrator, Civil Service (Medical): No    Lack of Transportation (Non-Medical): No  Physical Activity: Not on file  Stress: Not on file  Social Connections: Not on file   Intimate Partner Violence: Not At Risk (06/27/2022)   Humiliation, Afraid, Rape, and Kick questionnaire    Fear of Current or Ex-Partner: No    Emotionally Abused: No    Physically Abused: No    Sexually Abused: No   History reviewed. No pertinent family history.  Current Outpatient Medications  Medication Sig Dispense Refill   amLODipine  (NORVASC ) 5 MG tablet Take 5 mg by mouth daily.     aspirin  325 MG tablet Take 325 mg by mouth daily.     atorvastatin  (LIPITOR) 80 MG tablet Take 1 tablet (80 mg total) by mouth daily. 30 tablet 11   Cholecalciferol (VITAMIN D3) 50 MCG (2000 UT) capsule Take 2,000 Units by mouth at bedtime.     citalopram  (CELEXA ) 20 MG tablet Take 20 mg by mouth daily.     clopidogrel  (PLAVIX ) 75 MG tablet TAKE 1 TABLET BY MOUTH EVERY DAY 90 tablet 3   fluocinonide (LIDEX) 0.05 % external solution Apply 1 application. topically daily as needed (eczema).     fluticasone (FLONASE) 50 MCG/ACT nasal spray Place 1 spray into both nostrils daily as needed for congestion.     galantamine  (RAZADYNE  ER) 8 MG 24 hr capsule TAKE 2 CAPSULES (16 MG TOTAL) BY MOUTH AT BEDTIME. 180 capsule 4   levothyroxine (SYNTHROID) 25 MCG tablet Take 25 mcg by mouth daily before breakfast.     memantine  (NAMENDA ) 10 MG tablet TAKE 1 TABLET BY MOUTH TWICE A DAY 180 tablet 4   metoprolol  succinate (TOPROL -XL) 50 MG 24 hr tablet Take 50 mg by mouth daily.     Multiple Vitamin (MULTIVITAMIN WITH MINERALS) TABS Take 1 tablet by mouth at bedtime.     No current facility-administered medications for this visit.    No Known Allergies   REVIEW OF SYSTEMS:  Negative unless noted in HPI [X]  denotes positive finding, [ ]  denotes negative finding Cardiac  Comments:  Chest pain or chest pressure:    Shortness of breath upon exertion:    Short of breath when lying flat:    Irregular heart rhythm:        Vascular    Pain in calf, thigh, or hip brought on by ambulation:    Pain in feet at night that  wakes you up from your sleep:     Blood clot in your veins:    Leg swelling:         Pulmonary    Oxygen at home:    Productive cough:     Wheezing:         Neurologic    Sudden weakness in arms or legs:     Sudden numbness in arms or legs:     Sudden onset  of difficulty speaking or slurred speech:    Temporary loss of vision in one eye:     Problems with dizziness:         Gastrointestinal    Blood in stool:     Vomited blood:         Genitourinary    Burning when urinating:     Blood in urine:        Psychiatric    Major depression:         Hematologic    Bleeding problems:    Problems with blood clotting too easily:        Skin    Rashes or ulcers:        Constitutional    Fever or chills:      PHYSICAL EXAMINATION:  Vitals:   03/25/24 1052  BP: (!) 152/78  Pulse: 61  Temp: 98.1 F (36.7 C)  TempSrc: Temporal  Weight: 226 lb 4.8 oz (102.6 kg)    General:  WDWN in NAD; vital signs documented above Gait: Not observed HENT: WNL, normocephalic Pulmonary: normal non-labored breathing Cardiac: regular HR Abdomen: soft, NT, no masses Skin: without rashes Vascular Exam/Pulses: palpable L PT and R DP Extremities: without ischemic changes, without Gangrene , without cellulitis; without open wounds;  Musculoskeletal: no muscle wasting or atrophy  Neurologic: A&O X 3 Psychiatric:  The pt has Normal affect.   Non-Invasive Vascular Imaging:   Aortoiliac duplex demonstrates widely patent iliac stents bilaterally  ABI/TBIToday's ABIToday's TBIPrevious ABIPrevious TBI  +-------+-----------+-----------+------------+------------+  Right 1.15       0.84       1.11        0.82          +-------+-----------+-----------+------------+------------+  Left  1.06       0.81       1.01        0.71          +-------+-----------+-----------+------------+-----------     ASSESSMENT/PLAN:: 88 y.o. male here for follow up for surveillance of PAD with  bilateral iliac stents  Mr. Koloski is subjectively doing well.  He is here with his wife today who is the primary historian due to his dementia.  He is without rest pain or tissue loss of bilateral lower extremities.  Aortoiliac duplex demonstrates widely patent iliac stents bilaterally.  On exam he has palpable pedal pulses and normal ABI/TBI's.  We will repeat aortoiliac duplex as well as ABIs in 1 year.  He will continue his Plavix  and statin daily.   Donnice Sender, PA-C [facility] [phone]  Clinic MD:   Magda

## 2024-04-12 ENCOUNTER — Other Ambulatory Visit: Payer: Self-pay | Admitting: Vascular Surgery

## 2024-04-29 ENCOUNTER — Telehealth: Payer: Self-pay | Admitting: Neurology

## 2024-04-29 NOTE — Telephone Encounter (Signed)
 Pt called to request  to speak to Nurse Pt  wife believes Pt is having some hallucination and is not sure who she should talk to . Pt wife is very concern

## 2024-04-30 NOTE — Telephone Encounter (Signed)
 Pt wife called back  On concern for Pt . P t wife stated Pt has  calm down a little , How ever Pt would like to speak to Nurse about Concerns

## 2024-05-01 NOTE — Telephone Encounter (Signed)
 Called wife back at [phone] and LVM for wife informing her I was returning her call. Left our office number for call back.

## 2024-05-01 NOTE — Telephone Encounter (Signed)
 The patient's wife returned my call.  She states he had previously had a little hallucination occurrence but last Monday was the worst.  She states he was sitting in the recliner, doing something with his hands, and kept talking about events and family members who have passed previously.  She states Tuesday he slept a lot.  Today he has been more awake and calm.  I did encourage her to call Dr Christiane office to have him evaluated first for potential medical cause/infection that could result in these symptoms. I did emphasize that if he has any difficulty waking up or stroke-like symptoms to call 911 right away. I offered to go ahead and schedule an appointment with Dr Gregg but  for now she preferred to call back if needed. Will start with Dr Rexanne. She thanked me for the call and her questions were answered.
